# Patient Record
Sex: Male | Born: 1965
Health system: Southern US, Community
[De-identification: ages and names within clinical notes are randomized; demographics above are authoritative.]

## PROBLEM LIST (undated history)

## (undated) ENCOUNTER — Emergency Department (HOSPITAL_COMMUNITY): Discharge status: Absent without leave | Payer: BLUE CROSS/BLUE SHIELD

## (undated) DIAGNOSIS — K219 Gastro-esophageal reflux disease without esophagitis: Secondary | ICD-10-CM

## (undated) DIAGNOSIS — I10 Essential (primary) hypertension: Secondary | ICD-10-CM

## (undated) DIAGNOSIS — G4730 Sleep apnea, unspecified: Secondary | ICD-10-CM

## (undated) DIAGNOSIS — E119 Type 2 diabetes mellitus without complications: Secondary | ICD-10-CM

## (undated) DIAGNOSIS — F419 Anxiety disorder, unspecified: Secondary | ICD-10-CM

## (undated) DIAGNOSIS — M549 Dorsalgia, unspecified: Secondary | ICD-10-CM

## (undated) DIAGNOSIS — M1990 Unspecified osteoarthritis, unspecified site: Secondary | ICD-10-CM

## (undated) DIAGNOSIS — R748 Abnormal levels of other serum enzymes: Secondary | ICD-10-CM

## (undated) DIAGNOSIS — E782 Mixed hyperlipidemia: Secondary | ICD-10-CM

## (undated) DIAGNOSIS — D649 Anemia, unspecified: Secondary | ICD-10-CM

## (undated) HISTORY — PX: CATARACT EXTRACTION: SUR2

## (undated) HISTORY — PX: FRACTURE SURGERY: SHX138

---

## 2000-09-17 ENCOUNTER — Emergency Department (HOSPITAL_COMMUNITY): Admission: EM | Admit: 2000-09-17 | Discharge: 2000-09-17 | Payer: Self-pay | Admitting: Emergency Medicine

## 2000-10-10 ENCOUNTER — Emergency Department (HOSPITAL_COMMUNITY): Admission: EM | Admit: 2000-10-10 | Discharge: 2000-10-10 | Payer: Self-pay | Admitting: Emergency Medicine

## 2002-05-14 ENCOUNTER — Emergency Department (HOSPITAL_COMMUNITY): Admission: EM | Admit: 2002-05-14 | Discharge: 2002-05-14 | Payer: Self-pay | Admitting: Emergency Medicine

## 2003-05-09 ENCOUNTER — Emergency Department (HOSPITAL_COMMUNITY): Admission: EM | Admit: 2003-05-09 | Discharge: 2003-05-09 | Payer: Self-pay | Admitting: Emergency Medicine

## 2003-05-09 ENCOUNTER — Encounter: Payer: Self-pay | Admitting: Emergency Medicine

## 2005-03-16 ENCOUNTER — Emergency Department (HOSPITAL_COMMUNITY): Admission: EM | Admit: 2005-03-16 | Discharge: 2005-03-16 | Payer: Self-pay | Admitting: Emergency Medicine

## 2005-03-22 ENCOUNTER — Ambulatory Visit: Payer: Self-pay | Admitting: Pulmonary Disease

## 2005-04-21 ENCOUNTER — Ambulatory Visit (HOSPITAL_BASED_OUTPATIENT_CLINIC_OR_DEPARTMENT_OTHER): Admission: RE | Admit: 2005-04-21 | Discharge: 2005-04-21 | Payer: Self-pay | Admitting: Pulmonary Disease

## 2005-04-21 ENCOUNTER — Ambulatory Visit: Payer: Self-pay | Admitting: Pulmonary Disease

## 2005-05-02 ENCOUNTER — Ambulatory Visit: Payer: Self-pay | Admitting: Pulmonary Disease

## 2005-05-24 ENCOUNTER — Ambulatory Visit: Payer: Self-pay | Admitting: Cardiology

## 2005-05-31 ENCOUNTER — Ambulatory Visit: Payer: Self-pay

## 2005-06-21 ENCOUNTER — Ambulatory Visit: Payer: Self-pay | Admitting: Pulmonary Disease

## 2005-07-07 ENCOUNTER — Inpatient Hospital Stay (HOSPITAL_COMMUNITY): Admission: RE | Admit: 2005-07-07 | Discharge: 2005-07-08 | Payer: Self-pay | Admitting: General Surgery

## 2006-08-06 HISTORY — PX: HERNIA REPAIR: SHX51

## 2006-09-06 ENCOUNTER — Emergency Department (HOSPITAL_COMMUNITY): Admission: EM | Admit: 2006-09-06 | Discharge: 2006-09-06 | Payer: Self-pay | Admitting: Emergency Medicine

## 2010-08-26 ENCOUNTER — Encounter: Payer: Self-pay | Admitting: Unknown Physician Specialty

## 2012-08-06 DIAGNOSIS — R748 Abnormal levels of other serum enzymes: Secondary | ICD-10-CM

## 2012-08-06 HISTORY — DX: Abnormal levels of other serum enzymes: R74.8

## 2012-09-23 ENCOUNTER — Other Ambulatory Visit: Payer: Self-pay | Admitting: Gastroenterology

## 2012-09-29 ENCOUNTER — Encounter (HOSPITAL_COMMUNITY)
Admission: RE | Disposition: A | Discharge status: Home / Self Care | Payer: Self-pay | Source: Ambulatory Visit | Attending: Gastroenterology

## 2012-09-29 ENCOUNTER — Ambulatory Visit (HOSPITAL_COMMUNITY)
Admission: RE | Admit: 2012-09-29 | Discharge: 2012-09-29 | Disposition: A | Discharge status: Home / Self Care | Payer: BC Managed Care – PPO | Source: Ambulatory Visit | Attending: Gastroenterology | Admitting: Gastroenterology

## 2012-09-29 ENCOUNTER — Encounter: Payer: Self-pay | Admitting: Pulmonary Disease

## 2012-09-29 ENCOUNTER — Encounter (HOSPITAL_COMMUNITY): Payer: Self-pay

## 2012-09-29 DIAGNOSIS — K648 Other hemorrhoids: Secondary | ICD-10-CM | POA: Insufficient documentation

## 2012-09-29 DIAGNOSIS — G4730 Sleep apnea, unspecified: Secondary | ICD-10-CM | POA: Insufficient documentation

## 2012-09-29 DIAGNOSIS — Z79899 Other long term (current) drug therapy: Secondary | ICD-10-CM | POA: Insufficient documentation

## 2012-09-29 DIAGNOSIS — E119 Type 2 diabetes mellitus without complications: Secondary | ICD-10-CM | POA: Insufficient documentation

## 2012-09-29 DIAGNOSIS — K625 Hemorrhage of anus and rectum: Secondary | ICD-10-CM | POA: Insufficient documentation

## 2012-09-29 DIAGNOSIS — D371 Neoplasm of uncertain behavior of stomach: Secondary | ICD-10-CM | POA: Insufficient documentation

## 2012-09-29 DIAGNOSIS — K219 Gastro-esophageal reflux disease without esophagitis: Secondary | ICD-10-CM | POA: Insufficient documentation

## 2012-09-29 DIAGNOSIS — D378 Neoplasm of uncertain behavior of other specified digestive organs: Secondary | ICD-10-CM | POA: Insufficient documentation

## 2012-09-29 DIAGNOSIS — I10 Essential (primary) hypertension: Secondary | ICD-10-CM | POA: Insufficient documentation

## 2012-09-29 DIAGNOSIS — R079 Chest pain, unspecified: Secondary | ICD-10-CM | POA: Insufficient documentation

## 2012-09-29 HISTORY — DX: Essential (primary) hypertension: I10

## 2012-09-29 HISTORY — PX: ESOPHAGOGASTRODUODENOSCOPY: SHX5428

## 2012-09-29 HISTORY — PX: COLONOSCOPY: SHX5424

## 2012-09-29 HISTORY — DX: Type 2 diabetes mellitus without complications: E11.9

## 2012-09-29 HISTORY — DX: Sleep apnea, unspecified: G47.30

## 2012-09-29 SURGERY — EGD (ESOPHAGOGASTRODUODENOSCOPY)
Anesthesia: Moderate Sedation

## 2012-09-29 MED ORDER — MIDAZOLAM HCL 10 MG/2ML IJ SOLN
INTRAMUSCULAR | Status: AC
  Filled 2012-09-29: qty 4

## 2012-09-29 MED ORDER — SODIUM CHLORIDE 0.9 % IV SOLN
INTRAVENOUS | Status: DC
  Administered 2012-09-29: 15:00:00 via INTRAVENOUS

## 2012-09-29 MED ORDER — BUTAMBEN-TETRACAINE-BENZOCAINE 2-2-14 % EX AERO
INHALATION_SPRAY | CUTANEOUS | Status: DC | PRN
  Administered 2012-09-29: 2 via TOPICAL

## 2012-09-29 MED ORDER — FENTANYL CITRATE 0.05 MG/ML IJ SOLN
INTRAMUSCULAR | Status: AC
  Filled 2012-09-29: qty 4

## 2012-09-29 MED ORDER — FENTANYL CITRATE 0.05 MG/ML IJ SOLN
INTRAMUSCULAR | Status: DC | PRN
  Administered 2012-09-29 (×4): 25 ug via INTRAVENOUS

## 2012-09-29 MED ORDER — DIPHENHYDRAMINE HCL 50 MG/ML IJ SOLN
INTRAMUSCULAR | Status: AC
  Filled 2012-09-29: qty 1

## 2012-09-29 MED ORDER — DIPHENHYDRAMINE HCL 50 MG/ML IJ SOLN
INTRAMUSCULAR | Status: DC | PRN
  Administered 2012-09-29: 25 mg via INTRAVENOUS

## 2012-09-29 MED ORDER — MIDAZOLAM HCL 10 MG/2ML IJ SOLN
INTRAMUSCULAR | Status: DC | PRN
  Administered 2012-09-29 (×4): 2 mg via INTRAVENOUS

## 2012-09-29 NOTE — Op Note (Signed)
[facility] [address] [place], [address]   OPERATIVE PROCEDURE REPORT  PATIENT: Scott [name], Scott [name]  MR#: [id number] BIRTHDATE: Oct 31, 1965 GENDER: Male ENDOSCOPIST: Dr.  Lorenza Burton, MD ASSISTANT:   Anthony Sar, RN Oletha Blend, technician Jamal Maes, RN PROCEDURE DATE: 09/29/2012 PRE-PROCEDURE PREPARATION: Moviprep was taken as instructed (32 ounces the night prior to the procedure and 32 ounces the morning of the procedure).  The patient was fasted for four hours prior to the procedure. PRE-PROCEDURE PHYSICAL: Patient has stable vital signs.  Neck is supple.  There is no JVD, thyromegaly or LAD.  Chest clear to auscultation.  S1 and S2 regular.  Abdomen soft, non-distended, non-tender with NABS. PROCEDURE:     Colonoscopy with snare polypectomy ASA CLASS:     Class IV INDICATIONS:     1.  Rectal Bleeding.   2.  Colorectal cancer screening. MEDICATIONS:     Fentanyl 25 mcg IV and Versed 4 mg IV  DESCRIPTION OF PROCEDURE: After the risks, benefits, and alternatives of the procedure were thoroughly explained [including a 10% missed rate of cancer and polyps], informed consent was obtained.  Digital rectal exam was performed.  The [phone])  was introduced through the anus  and advanced to the terminal ileum which was intubated for a short distance , limited by No adverse events experienced.   The quality of the prep was excellent, using MoviPrep . Multiple washes were done. Small lesions could be missed. The instrument was then slowly withdrawn as the colon was fully examined.     COLON FINDINGS: A 6-7 mm sessile polyp was found in the rectosigmoid colon and was removed by a hot snare x 1 [150/15]. The rest of the entire colonic mucosa appeared healthy with a normal vascular pattern.  No masses, diverticula or AVMs were noted.  The appendiceal orifice and the ICV were identified and photographed. The terminal ileum appeared  normal.  Retroflexed views revealed small internal hemorrhoids. The patient tolerated the procedure without immediate complications.  The scope was then withdrawn from the patient and the procedure terminated.  TIME TO CECUM:  8 minutes 0 seconds WITHDRAW TIME:  6 minutes 0 seconds  IMPRESSION:     1) Sessile polyp was found in the rectosigmoid colon-removed by hot snare x 1 2) Small internal hemorrhoids.   RECOMMENDATIONS:     1.  Hold all aspirin, aspirin products, and anti-inflammatory medication for 2 weeks. 2.  Await pathology results. 3.  Continue surveillance.  REPEAT EXAM:      In 5 year(s)  for a repeat colonoscopy.  If the patient has any abnormal GI symptoms in the interim, she/he have been advised to contact the office as soon as possible for further recommendations.   CPT CODES:     [phone], Colonoscopy with Polypectomy (Snare)  DIAGNOSIS CODES:     569.3 Hematochezia V76.51 Colorectal cancer screening  REFERRED HK:[id number] Tiburcio Pea, M.D.  eSigned:  Dr. Lorenza Burton, MD 09/29/2012 4:00 PM   PATIENT NAME:  Scott [name], Scott [name] MR#: [id number]

## 2012-09-29 NOTE — Op Note (Signed)
[facility] [address] [place], [address]   OPERATIVE PROCEDURE REPORT  PATIENT :Scott [name], Scott [name]  MR#: [id number] BIRTHDATE :1966-01-09 GENDER: Male ENDOSCOPIST: Dr.  Lorenza Burton, MD ASSISTANT:   Anthony Sar, RN Oletha Blend, technician Jamal Maes, RN PROCEDURE DATE: October 26, 2012 PRE-PROCEDURE PREPERATION: Patient fasted for 4 hours prior to procedure. PRE-PROCEDURE PHYSICAL: Patient has stable vital signs.  Neck is supple.  There is no JVD, thyromegaly or LAD.  Chest clear to auscultation.  S1 and S2 regular.  Abdomen soft, morbidly obese, non-distended, non-tender with NABS. PROCEDURE:     EGD, diagnostic ASA CLASS:     Class IV INDICATIONS:     Chest pain, Acid reflux , and Hematochezia. MEDICATIONS:     Fentanyl 75 mcg IV, Versed 6 mg IV, and Benadryl 25 mg IV TOPICAL ANESTHETIC:   Cetacaine spray. DESCRIPTION OF PROCEDURE:   After the risks benefits and alternatives of the procedure were thoroughly explained, informed consent was obtained.  The Pentax Gastroscope [id number]  was introduced through the mouth and advanced to the second portion of the duodenum , without limitations. The instrument was slowly withdrawn as the mucosa was fully examined.   The esophagus, stomach and the proximal small bowel appeared normal. There were no ulcers, erosions, masses or polyps noted. Retroflexed views revealed no abnormalities. The scope was then withdrawn from the patient and the procedure terminated. The patient tolerated the procedure without immediate complications.  IMPRESSION:  Normal esophagogastroduodenoscopy.  RECOMMENDATIONS:     Avoid NSAIDS for now and continue PPI's.  REPEAT EXAM:  None planned for now.  DISCHARGE INSTRUCTIONS: standard discharge _______________________________ eSigned:  Dr. Lorenza Burton, MD 26-Oct-2012 3:33 PM   CPT CODES:     [phone], EGD  DIAGNOSIS CODES:     786.50 Chest Pain 530.81 GERD  569.3 Hematochezia   CC: Holley Bouche, M.D.

## 2012-09-29 NOTE — Progress Notes (Signed)
Pt's AVS not completed at this time. Paged physician to inform.  Physician called and informed that she did do her AVS and will not be returning to the hospital at this time.  Since d/c papers could not be printed d/t AVS not complete, generic d/c colonoscopy and endoscopy were printed and reviewed w/ pt and family.

## 2012-09-29 NOTE — H&P (Signed)
Scott [name] is an 47 y.o. [place] male.   Chief Complaint: Rectal bleeding and acid reflux. HPI: Patient is here for further evaluation of rectal bleeding that has been an issue off and on for about 1 year. He takes Omeprazole for reflux and has also been taking Meloxicam for joint pains. Patient has severe sleep apnea and therefore he was brought to the hospital for his procedures. He denies a history of ulcers, jaundice or colitis. He has a good appetite and has gained significant amount of weight in the last 5 years.    Past Medical History  Diagnosis Date  . Hypertension   . Sleep apnea   . Diabetes mellitus without complication     Past Surgical History  Procedure Laterality Date  . Fracture surgery      right wrist with screws   History reviewed. No pertinent family history. Social History:  does not have a smoking history on file. He has never used smokeless tobacco. He reports that  drinks alcohol. He reports that he does not use illicit drugs.  Allergies: No Known Allergies  Medications Prior to Admission  Medication Sig Dispense Refill  . atenolol-chlorthalidone (TENORETIC) 100-25 MG per tablet Take 1 tablet by mouth daily.      . busPIRone (BUSPAR) 5 MG tablet Take 5 mg by mouth 2 times daily at 12 noon and 4 pm.      . fish oil-omega-3 fatty acids 1000 MG capsule Take 2 g by mouth daily.      Marland Kitchen HYDROcodone-acetaminophen (VICODIN) 5-500 MG per tablet Take 1 tablet by mouth every 6 (six) hours as needed for pain.      Marland Kitchen lisinopril (PRINIVIL,ZESTRIL) 2.5 MG tablet Take 2.5 mg by mouth daily.      Marland Kitchen loratadine (CLARITIN) 10 MG tablet Take 10 mg by mouth daily.      . metFORMIN (GLUCOPHAGE) 500 MG tablet Take 1,000 mg by mouth daily with breakfast.      . omeprazole (PRILOSEC) 20 MG capsule Take 20 mg by mouth daily.      . pravastatin (PRAVACHOL) 40 MG tablet Take 40 mg by mouth daily.        Results for orders placed during the hospital encounter of 09/29/12  (from the past 48 hour(s))  GLUCOSE, CAPILLARY     Status: Abnormal   Collection Time    09/29/12  2:48 PM      Result Value Range   Glucose-Capillary 114 (*) 70 - 99 mg/dL   No results found.  Review of Systems  Constitutional: Negative for fever, chills, weight loss, malaise/fatigue and diaphoresis.  HENT: Positive for neck pain.   Eyes: Negative.   Respiratory: Negative.  Negative for wheezing.   Gastrointestinal: Positive for heartburn, nausea, abdominal pain and blood in stool. Negative for vomiting, diarrhea, constipation and melena.  Genitourinary: Negative.   Musculoskeletal: Positive for myalgias, back pain and joint pain.  Skin: Negative.   Neurological: Negative.   Psychiatric/Behavioral: Negative.    Blood pressure 134/74, temperature 98.9 F (37.2 C), temperature source Oral, resp. rate 24, height 6\' 2"  (1.88 m), weight 149.233 kg (329 lb), SpO2 95.00%. Physical Exam  Constitutional: He is oriented to person, place, and time. He appears well-developed and well-nourished.  HENT:  Head: Normocephalic and atraumatic.  Eyes: Conjunctivae and EOM are normal. Pupils are equal, round, and reactive to light.  Neck: Normal range of motion. Neck supple.  Cardiovascular: Normal rate and regular rhythm.   Respiratory: Effort normal and  breath sounds normal.  GI: Soft. Bowel sounds are normal. He exhibits no distension and no mass. There is no tenderness. There is no rebound and no guarding.  Musculoskeletal: Normal range of motion.  Neurological: He is alert and oriented to person, place, and time.  Skin: Skin is warm and dry.  Psychiatric: He has a normal mood and affect. His behavior is normal. Judgment and thought content normal.     Assessment/Plan Acid reflux and rectal bleeding: proceed with an EGD and a colonoscopy at this time.  Tyge Somers 09/29/2012, 3:09 PM

## 2012-09-30 ENCOUNTER — Encounter (HOSPITAL_COMMUNITY): Payer: Self-pay | Admitting: Gastroenterology

## 2012-12-31 ENCOUNTER — Other Ambulatory Visit: Payer: Self-pay | Admitting: Family Medicine

## 2012-12-31 DIAGNOSIS — R7989 Other specified abnormal findings of blood chemistry: Secondary | ICD-10-CM

## 2013-01-01 ENCOUNTER — Ambulatory Visit
Admission: RE | Admit: 2013-01-01 | Discharge: 2013-01-01 | Disposition: A | Discharge status: Home / Self Care | Payer: BC Managed Care – PPO | Source: Ambulatory Visit | Attending: Family Medicine | Admitting: Family Medicine

## 2013-01-01 DIAGNOSIS — R7989 Other specified abnormal findings of blood chemistry: Secondary | ICD-10-CM

## 2014-10-13 ENCOUNTER — Other Ambulatory Visit (INDEPENDENT_AMBULATORY_CARE_PROVIDER_SITE_OTHER): Payer: Self-pay | Admitting: General Surgery

## 2014-11-17 ENCOUNTER — Telehealth: Payer: Self-pay | Admitting: Dietician

## 2014-11-17 ENCOUNTER — Encounter: Payer: Self-pay | Admitting: Dietician

## 2014-11-17 ENCOUNTER — Encounter: Payer: 59 | Attending: General Surgery | Admitting: Dietician

## 2014-11-17 DIAGNOSIS — Z713 Dietary counseling and surveillance: Secondary | ICD-10-CM | POA: Diagnosis not present

## 2014-11-17 DIAGNOSIS — Z6841 Body Mass Index (BMI) 40.0 and over, adult: Secondary | ICD-10-CM | POA: Diagnosis not present

## 2014-11-17 NOTE — Patient Instructions (Signed)

## 2014-11-17 NOTE — Telephone Encounter (Signed)
Error

## 2014-11-17 NOTE — Progress Notes (Signed)
  Pre-Op Assessment Visit:  Pre-Operative Sleeve Gastrectomy Surgery  Medical Nutrition Therapy:  Appt start time: 9292   End time:  4462.  Patient was seen on 11/17/2014 for Pre-Operative Nutrition Assessment. Assessment and letter of approval faxed to [facility]ariat[facility] Program coordinator on 11/17/2014.   Preferred Learning Style:   No preference indicated   Learning Readiness:   Ready  Handouts given during visit include:  Pre-Op Goals Bariatric Surgery Protein Shakes   During the appointment today the following Pre-Op Goals were reviewed with the patient: Maintain or lose weight as instructed by your surgeon Make healthy food choices Begin to limit portion sizes Limited concentrated sugars and fried foods Keep fat/sugar in the single digits per serving on   food labels Practice CHEWING your food  (aim for 30 chews per bite or until applesauce consistency) Practice not drinking 15 minutes before, during, and 30 minutes after each meal/snack Avoid all carbonated beverages  Avoid/limit caffeinated beverages  Avoid all sugar-sweetened beverages Consume 3 meals per day; eat every 3-5 hours Make a list of non-food related activities Aim for 64-100 ounces of FLUID daily  Aim for at least 60-80 grams of PROTEIN daily Look for a liquid protein source that contain ?15 g protein and ?5 g carbohydrate  (ex: shakes, drinks, shots)  Patient-Centered Goals: Scott [name] would like to not have to use CPAP, improve his diabetes/blood pressure, and become much more active. 10 level of confidence/10 level of importance   Demonstrated degree of understanding via:  Teach Back  Teaching Method Utilized:  Visual Auditory Hands on  Barriers to learning/adherence to lifestyle change: none  Patient to [facility] the [facility] to enroll in Pre-Op and Post-Op Nutrition Education when surgery date is scheduled.

## 2014-11-29 ENCOUNTER — Ambulatory Visit (HOSPITAL_COMMUNITY): Admission: RE | Admit: 2014-11-29 | Payer: 59 | Source: Ambulatory Visit | Admitting: General Surgery

## 2014-11-29 ENCOUNTER — Inpatient Hospital Stay (HOSPITAL_COMMUNITY): Admission: RE | Admit: 2014-11-29 | Payer: Self-pay | Source: Ambulatory Visit

## 2014-11-29 ENCOUNTER — Ambulatory Visit (HOSPITAL_COMMUNITY): Admission: RE | Admit: 2014-11-29 | Payer: Self-pay | Source: Ambulatory Visit

## 2014-11-29 SURGERY — BREATH TEST, FOR HELICOBACTER PYLORI

## 2014-12-11 ENCOUNTER — Ambulatory Visit: Payer: Self-pay | Admitting: Dietician

## 2014-12-20 ENCOUNTER — Ambulatory Visit: Payer: 59 | Admitting: Dietician

## 2014-12-22 ENCOUNTER — Encounter: Payer: 59 | Attending: General Surgery | Admitting: Dietician

## 2014-12-22 ENCOUNTER — Encounter: Payer: Self-pay | Admitting: Dietician

## 2014-12-22 DIAGNOSIS — Z713 Dietary counseling and surveillance: Secondary | ICD-10-CM | POA: Insufficient documentation

## 2014-12-22 DIAGNOSIS — Z6841 Body Mass Index (BMI) 40.0 and over, adult: Secondary | ICD-10-CM | POA: Diagnosis not present

## 2014-12-22 NOTE — Progress Notes (Signed)
  6 Months Supervised Weight Loss Visit:   Pre-Operative Sleeve Gastrectomy Surgery  Medical Nutrition Therapy:  Appt start time: 5462 end time:  1225.  Primary concerns today: Supervised Weight Loss Visit. Returns with a 6 lb weight loss since last month. No longer having pizza and hamburgers. Eating 2 meals per day. Has tried protein shakes but doesn't like them. Wants to try more. Working on chewing well and not drinking during meals though not waiting 30 minutes to drink.   Weight: 312.9 lbs BMI: 41.9   Preferred Learning Style:   No preference indicated   Learning Readiness:   Ready   Medications: see list  Recent physical activity:  Walking a mile each day, mows lawn  Progress Towards Goal(s):  In progress.    Nutritional Diagnosis:  [place]-3.3 Obesity related to past poor dietary habits and physical inactivity as evidenced by patient attending supervised weight loss for insurance approval of bariatric surgery.    Intervention:  Nutrition counseling provided. Plan: Continue having healthy meals. Plan to exercise 5 x week for 25 minutes. Start working on waiting 30 minutes to drink after meals. Work on having 3 meals per day. Have an a couple eggs for breakfast. Try some other protein shakes.   Teaching Method Utilized:  Visual Auditory Hands on  Barriers to learning/adherence to lifestyle change: none  Demonstrated degree of understanding via:  Teach Back   Monitoring/Evaluation:  Dietary intake, exercise, and body weight. Follow up in 1 months for 6 month supervised weight loss visit.

## 2014-12-22 NOTE — Patient Instructions (Signed)
Continue having healthy meals. Plan to exercise 5 x week for 25 minutes. Start working on waiting 30 minutes to drink after meals. Work on having 3 meals per day. Have an a couple eggs for breakfast. Try some other protein shakes.

## 2014-12-30 ENCOUNTER — Encounter (INDEPENDENT_AMBULATORY_CARE_PROVIDER_SITE_OTHER): Payer: Self-pay

## 2014-12-30 ENCOUNTER — Ambulatory Visit (HOSPITAL_COMMUNITY): Admission: RE | Admit: 2014-12-30 | Payer: 59 | Source: Ambulatory Visit | Admitting: General Surgery

## 2014-12-30 ENCOUNTER — Encounter (HOSPITAL_COMMUNITY): Admission: RE | Payer: Self-pay | Source: Ambulatory Visit

## 2014-12-30 ENCOUNTER — Ambulatory Visit (HOSPITAL_COMMUNITY)
Admission: RE | Admit: 2014-12-30 | Discharge: 2014-12-30 | Disposition: A | Discharge status: Home / Self Care | Payer: 59 | Source: Ambulatory Visit | Attending: General Surgery | Admitting: General Surgery

## 2014-12-30 ENCOUNTER — Other Ambulatory Visit: Payer: Self-pay

## 2014-12-30 DIAGNOSIS — G4730 Sleep apnea, unspecified: Secondary | ICD-10-CM | POA: Insufficient documentation

## 2014-12-30 DIAGNOSIS — I10 Essential (primary) hypertension: Secondary | ICD-10-CM | POA: Insufficient documentation

## 2014-12-30 DIAGNOSIS — E119 Type 2 diabetes mellitus without complications: Secondary | ICD-10-CM | POA: Insufficient documentation

## 2014-12-30 DIAGNOSIS — Z6841 Body Mass Index (BMI) 40.0 and over, adult: Secondary | ICD-10-CM | POA: Insufficient documentation

## 2014-12-30 SURGERY — BREATH TEST, FOR HELICOBACTER PYLORI

## 2015-01-17 ENCOUNTER — Ambulatory Visit: Payer: 59 | Admitting: Dietician

## 2015-02-14 ENCOUNTER — Encounter: Payer: 59 | Attending: General Surgery | Admitting: Dietician

## 2015-02-14 ENCOUNTER — Encounter: Payer: Self-pay | Admitting: Dietician

## 2015-02-14 DIAGNOSIS — Z6841 Body Mass Index (BMI) 40.0 and over, adult: Secondary | ICD-10-CM | POA: Insufficient documentation

## 2015-02-14 DIAGNOSIS — Z713 Dietary counseling and surveillance: Secondary | ICD-10-CM | POA: Insufficient documentation

## 2015-02-14 NOTE — Progress Notes (Signed)
  6 Months Supervised Weight Loss Visit:   Pre-Operative Sleeve Gastrectomy Surgery  Medical Nutrition Therapy:  Appt start time: 410 end time:  425.  Primary concerns today: Supervised Weight Loss Visit. Returns with a 4 lb weight loss in the last 2 months. He reports that his insurance "dropped" CCS and he is not able to have surgery anymore. 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 He and his family have cut out fast foods. He drinks only water, unsweet tea, and decaf coffee. He bikes 10-20 minutes per day and has an active job.   Weight: 309 lbs BMI: 40.9   Preferred Learning Style:   No preference indicated   Learning Readiness:   Ready   Medications: see list  Recent physical activity:  Biking 10-20 minutes each day  Progress Towards Goal(s):  In progress.    Nutritional Diagnosis:  [place]-3.3 Obesity related to past poor dietary habits and physical inactivity as evidenced by patient attending supervised weight loss for insurance approval of bariatric surgery.    Intervention:  Nutrition counseling provided. Plan: Continue having healthy meals. Plan to exercise 5 x week for 25 minutes. Start working on waiting 30 minutes to drink after meals. Work on having 3 meals per day. Have an a couple eggs for breakfast. Try some other protein shakes.   Teaching Method Utilized:  Visual Auditory Hands on  Barriers to learning/adherence to lifestyle change: none  Demonstrated degree of understanding via:  Teach Back   Monitoring/Evaluation:  Dietary intake, exercise, and body weight. Follow up in 1 months for 6 month supervised weight loss visit.

## 2015-02-14 NOTE — Patient Instructions (Signed)
Continue having healthy meals. Plan to exercise 5 x week for 25 minutes. Start working on waiting 30 minutes to drink after meals. Try some other protein shakes.

## 2015-03-15 ENCOUNTER — Encounter: Payer: 59 | Attending: General Surgery | Admitting: Dietician

## 2015-03-15 ENCOUNTER — Encounter: Payer: Self-pay | Admitting: Dietician

## 2015-03-15 DIAGNOSIS — Z713 Dietary counseling and surveillance: Secondary | ICD-10-CM | POA: Insufficient documentation

## 2015-03-15 DIAGNOSIS — Z6841 Body Mass Index (BMI) 40.0 and over, adult: Secondary | ICD-10-CM | POA: Insufficient documentation

## 2015-03-15 NOTE — Progress Notes (Signed)
  6 Months Supervised Weight Loss Visit:   Pre-Operative Sleeve Gastrectomy Surgery  Medical Nutrition Therapy:  Appt start time: 440 end time:  455  Primary concerns today: Supervised Weight Loss Visit. Returns with a 2 lb weight loss in the last month. Still drinking only water and biking regularly, about 3x a week. He feels like he is eating healthy but sometimes eats too much. He has been trying to drink a lot of water before his meals. He knows it is not recommended to drink while eating after surgery. His blood sugars have improved since losing weight, HgbA1c has dropped into the 6's. He has tried Producer, television/film/video protein shake and likes the Administrator, [employer].     Weight: 307.4 lbs BMI: 40.6   Preferred Learning Style:   No preference indicated   Learning Readiness:   Ready  B: skips, wakes up late S: protein shake L: chicken, rice, vegetables S: none D: eggs OR beans and rice OR ham sandwich on wheat  Medications: see list  Recent physical activity:  Biking 10-20 minutes each day  Progress Towards Goal(s):  In progress.    Nutritional Diagnosis:  [place]-3.3 Obesity related to past poor dietary habits and physical inactivity as evidenced by patient attending supervised weight loss for insurance approval of bariatric surgery.    Intervention:  Nutrition counseling provided. Plan: Continue having healthy meals. Plan to exercise 5 x week for 25 minutes. Start working on waiting 30 minutes to drink after meals. Work on having 3 meals per day. Have an a couple eggs for breakfast. Try some other protein shakes.   Teaching Method Utilized:  Visual Auditory Hands on  Barriers to learning/adherence to lifestyle change: none  Demonstrated degree of understanding via:  Teach Back   Monitoring/Evaluation:  Dietary intake, exercise, and body weight. Follow up in 1 months for 6 month supervised weight loss visit.

## 2015-04-14 ENCOUNTER — Ambulatory Visit: Payer: 59 | Admitting: Dietician

## 2015-04-18 ENCOUNTER — Ambulatory Visit: Payer: 59 | Admitting: Dietician

## 2015-08-01 ENCOUNTER — Encounter (HOSPITAL_COMMUNITY): Payer: Self-pay | Admitting: *Deleted

## 2015-08-01 ENCOUNTER — Emergency Department (HOSPITAL_COMMUNITY)
Admission: EM | Admit: 2015-08-01 | Discharge: 2015-08-01 | Disposition: A | Discharge status: Home / Self Care | Payer: 59 | Attending: Emergency Medicine | Admitting: Emergency Medicine

## 2015-08-01 DIAGNOSIS — B349 Viral infection, unspecified: Secondary | ICD-10-CM | POA: Insufficient documentation

## 2015-08-01 DIAGNOSIS — Z8669 Personal history of other diseases of the nervous system and sense organs: Secondary | ICD-10-CM | POA: Insufficient documentation

## 2015-08-01 DIAGNOSIS — E119 Type 2 diabetes mellitus without complications: Secondary | ICD-10-CM | POA: Insufficient documentation

## 2015-08-01 DIAGNOSIS — Z79899 Other long term (current) drug therapy: Secondary | ICD-10-CM | POA: Insufficient documentation

## 2015-08-01 DIAGNOSIS — Z7984 Long term (current) use of oral hypoglycemic drugs: Secondary | ICD-10-CM | POA: Diagnosis not present

## 2015-08-01 DIAGNOSIS — R05 Cough: Secondary | ICD-10-CM

## 2015-08-01 DIAGNOSIS — R059 Cough, unspecified: Secondary | ICD-10-CM

## 2015-08-01 DIAGNOSIS — R0981 Nasal congestion: Secondary | ICD-10-CM | POA: Diagnosis present

## 2015-08-01 DIAGNOSIS — I10 Essential (primary) hypertension: Secondary | ICD-10-CM | POA: Insufficient documentation

## 2015-08-01 DIAGNOSIS — R110 Nausea: Secondary | ICD-10-CM

## 2015-08-01 MED ORDER — ONDANSETRON 4 MG PO TBDP: 4.0000 mg | ORAL_TABLET | Freq: Three times a day (TID) | ORAL | Status: DC | PRN

## 2015-08-01 MED ORDER — DIPHENOXYLATE-ATROPINE 2.5-0.025 MG PO TABS: 1.0000 | ORAL_TABLET | Freq: Four times a day (QID) | ORAL | Status: DC | PRN

## 2015-08-01 MED ORDER — BENZONATATE 100 MG PO CAPS: 100.0000 mg | ORAL_CAPSULE | Freq: Three times a day (TID) | ORAL | Status: DC

## 2015-08-01 MED ORDER — HYDROCODONE-ACETAMINOPHEN 7.5-325 MG/15ML PO SOLN: 15.0000 mL | Freq: Four times a day (QID) | ORAL | Status: DC | PRN

## 2015-08-01 NOTE — Discharge Instructions (Signed)
Nausea, Adult Nausea means you feel sick to your stomach or need to throw up (vomit). It may be a sign of a more serious problem. If nausea gets worse, you may throw up. If you throw up a lot, you may lose too much body fluid (dehydration). HOME CARE   Get plenty of rest.  Ask your doctor how to replace body fluid losses (rehydrate).  Eat small amounts of food. Sip liquids more often.  Take all medicines as told by your doctor. GET HELP RIGHT AWAY IF:  You have a fever.  You pass out (faint).  You keep throwing up or have blood in your throw up.  You are very weak, have dry lips or a dry mouth, or you are very thirsty (dehydrated).  You have dark or bloody poop (stool).  You have very bad chest or belly (abdominal) pain.  You do not get better after 2 days, or you get worse.  You have a headache. MAKE SURE YOU:  Understand these instructions.  Will watch your condition.  Will get help right away if you are not doing well or get worse.   This information is not intended to replace advice given to you by your health care provider. Make sure you discuss any questions you have with your health care provider.   Document Released: 07/12/2011 Document Revised: 10/15/2011 Document Reviewed: 07/12/2011 Elsevier Interactive Patient Education 2016 Elsevier Inc.  Cough, Adult A cough helps to clear your throat and lungs. A cough may last only 2-3 weeks (acute), or it may last longer than 8 weeks (chronic). Many different things can cause a cough. A cough may be a sign of an illness or another medical condition. HOME CARE  Pay attention to any changes in your cough.  Take medicines only as told by your doctor.  If you were prescribed an antibiotic medicine, take it as told by your doctor. Do not stop taking it even if you start to feel better.  Talk with your doctor before you try using a cough medicine.  Drink enough fluid to keep your pee (urine) clear or pale yellow.  If  the air is dry, use a cold steam vaporizer or humidifier in your home.  Stay away from things that make you cough at work or at home.  If your cough is worse at night, try using extra pillows to raise your head up higher while you sleep.  Do not smoke, and try not to be around smoke. If you need help quitting, ask your doctor.  Do not have caffeine.  Do not drink alcohol.  Rest as needed. GET HELP IF:  You have new problems (symptoms).  You cough up yellow fluid (pus).  Your cough does not get better after 2-3 weeks, or your cough gets worse.  Medicine does not help your cough and you are not sleeping well.  You have pain that gets worse or pain that is not helped with medicine.  You have a fever.  You are losing weight and you do not know why.  You have night sweats. GET HELP RIGHT AWAY IF:  You cough up blood.  You have trouble breathing.  Your heartbeat is very fast.   This information is not intended to replace advice given to you by your health care provider. Make sure you discuss any questions you have with your health care provider.   Document Released: 04/05/2011 Document Revised: 04/13/2015 Document Reviewed: 09/29/2014 Elsevier Interactive Patient Education [employer].

## 2015-08-01 NOTE — ED Notes (Signed)
Patient with no complaints at this time. Respirations even and unlabored. Skin warm/dry. Discharge instructions reviewed with patient at this time. Patient given opportunity to voice concerns/ask questions. Patient discharged at this time and left Emergency Department with steady gait.   

## 2015-08-01 NOTE — ED Notes (Signed)
Pt comes in with nasal congestion and cough starting 5 days ago. Pt denies any vomiting but states he has had multiple episodes of diarrhea. Pt has fever upon triage.

## 2015-08-01 NOTE — ED Provider Notes (Signed)
History  By signing my name below, I, Marlowe Kays, attest that this documentation has been prepared under the direction and in the presence of Tanna Furry, MD. Electronically Signed: Marlowe Kays, ED Scribe. 08/01/2015. 12:20 PM.  Chief Complaint  Patient presents with  . Nasal Congestion  . Diarrhea   The history is provided by the patient and medical records. No language interpreter was used.    HPI Comments:  Scott [name] is a 49 y.o. morbidly obese male who presents to the Emergency Department complaining of cough that began approximately four days ago. He reports mild HA, fever, nasal congestion, decreased appetite and diarrhea. He reports small amount of blood in the diarrhea but attributes that to his hemorrhoids. He states his 58-year-old daughter has a cough and he and his family were on a cruise for several days, returning three days ago. He has not taken anything for his symptoms. He denies modifying factors. He states he did have a flu vaccination this year. He reports PMHx of HTN, DM, HLD. He states his CBGs have been good lately and his last A1C was 6.4.  Past Medical History  Diagnosis Date  . Hypertension   . Sleep apnea   . Diabetes mellitus without complication [facility])    Past Surgical History  Procedure Laterality Date  . Fracture surgery      right wrist with screws  . Esophagogastroduodenoscopy N/A 09/29/2012    Procedure: ESOPHAGOGASTRODUODENOSCOPY (EGD);  Surgeon: Juanita Craver, MD;  Location: [facility];  Service: Endoscopy;  Laterality: N/A;  . Colonoscopy N/A 09/29/2012    Procedure: COLONOSCOPY;  Surgeon: Juanita Craver, MD;  Location: [facility];  Service: Endoscopy;  Laterality: N/A;   No family history on file. Social History  Substance Use Topics  . Smoking status: Never Smoker   . Smokeless tobacco: Never Used  . Alcohol Use: Yes     Comment: rarely    Review of Systems  Constitutional: Negative for fever, chills, diaphoresis, appetite  change and fatigue.  HENT: Positive for congestion. Negative for mouth sores, sore throat and trouble swallowing.   Eyes: Negative for visual disturbance.  Respiratory: Positive for cough. Negative for chest tightness, shortness of breath and wheezing.   Cardiovascular: Negative for chest pain.  Gastrointestinal: Negative for nausea, vomiting, abdominal pain, diarrhea and abdominal distention.  Endocrine: Negative for polydipsia, polyphagia and polyuria.  Genitourinary: Negative for dysuria, frequency and hematuria.  Musculoskeletal: Negative for gait problem.  Skin: Negative for color change, pallor and rash.  Neurological: Negative for dizziness, syncope, light-headedness and headaches.  Hematological: Does not bruise/bleed easily.  Psychiatric/Behavioral: Negative for behavioral problems and confusion.    Allergies  Review of patient's allergies indicates no known allergies.  Home Medications   Prior to Admission medications   Medication Sig Start Date End Date Taking? Authorizing Provider  atenolol-chlorthalidone (TENORETIC) 100-25 MG per tablet Take 1 tablet by mouth daily.   Yes Historical Provider, MD  atorvastatin (LIPITOR) 20 MG tablet Take 20 mg by mouth daily.   Yes Historical Provider, MD  busPIRone (BUSPAR) 10 MG tablet Take 1 tablet by mouth 2 (two) times daily. 07/07/15  Yes Historical Provider, MD  canagliflozin (INVOKANA) 100 MG TABS tablet Take 100 mg by mouth daily before breakfast.   Yes Historical Provider, MD  cyclobenzaprine (FLEXERIL) 10 MG tablet Take 1 tablet by mouth daily as needed for muscle spasms.  07/07/15  Yes Historical Provider, MD  fish oil-omega-3 fatty acids 1000 MG capsule Take 2 g by mouth  daily.   Yes Historical Provider, MD  hydrOXYzine (ATARAX/VISTARIL) 25 MG tablet Take 1 tablet by mouth at bedtime as needed (sleep).  07/04/15  Yes Historical Provider, MD  lisinopril (PRINIVIL,ZESTRIL) 2.5 MG tablet Take 2.5 mg by mouth daily.   Yes Historical  Provider, MD  metFORMIN (GLUCOPHAGE) 500 MG tablet Take 1,000 mg by mouth daily with breakfast.   Yes Historical Provider, MD  omeprazole (PRILOSEC) 20 MG capsule Take 20 mg by mouth daily.   Yes Historical Provider, MD  benzonatate (TESSALON) 100 MG capsule Take 1 capsule (100 mg total) by mouth every 8 (eight) hours. 08/01/15   Tanna Furry, MD  diphenoxylate-atropine (LOMOTIL) 2.5-0.025 MG tablet Take 1 tablet by mouth 4 (four) times daily as needed for diarrhea or loose stools. 08/01/15   Tanna Furry, MD  HYDROcodone-acetaminophen (HYCET) 7.5-325 mg/15 ml solution Take 15 mLs by mouth 4 (four) times daily as needed for moderate pain. 08/01/15 07/31/16  Tanna Furry, MD  ondansetron (ZOFRAN ODT) 4 MG disintegrating tablet Take 1 tablet (4 mg total) by mouth every 8 (eight) hours as needed for nausea. 08/01/15   Tanna Furry, MD   Triage Vitals: BP 138/78 mmHg  Pulse 93  Temp(Src) 100.5 F (38.1 C) (Oral)  Resp 18  Ht 6\' 1"  (1.854 m)  Wt 310 lb (140.615 kg)  BMI 40.91 kg/m2  SpO2 100% Physical Exam  Constitutional: He is oriented to person, place, and time. He appears well-developed and well-nourished. No distress.  Morbid obesity. No distress.  HENT:  Head: Normocephalic.  Eyes: Pupils are equal, round, and reactive to light. Right conjunctiva is injected. Left conjunctiva is injected. No scleral icterus.  Neck: Normal range of motion. Neck supple. No thyromegaly present.  Cardiovascular: Normal rate and regular rhythm.  Exam reveals no gallop and no friction rub.   No murmur heard. Pulmonary/Chest: Effort normal and breath sounds normal. No respiratory distress. He has no wheezes. He has no rales.  Abdominal: Soft. Bowel sounds are normal. He exhibits no distension. There is no tenderness. There is no rebound.  Musculoskeletal: Normal range of motion.  Neurological: He is alert and oriented to person, place, and time.  Skin: Skin is warm and dry. No rash noted.  Psychiatric: He has a  normal mood and affect. His behavior is normal.    ED Course  Procedures (including critical care time) DIAGNOSTIC STUDIES: Oxygen Saturation is 100% on RA, normal by my interpretation.   COORDINATION OF CARE: 12:17 PM- Will prescribe cough medication, nausea medication and Lomotil. Pt verbalizes understanding and agrees to plan.  Medications - No data to display  Labs Review Labs Reviewed - No data to display  Imaging Review No results found. I have personally reviewed and evaluated these images and lab results as part of my medical decision-making.   EKG Interpretation None      MDM   Final diagnoses:  Viral syndrome  Cough  Nausea    Patient does not appear septic or toxic. Describes a viral type syndrome with dry cough occasional nausea and a few episodes of diarrhea. Sugars under control at home. Is not clinically dehydrated. Is not hypoxemic or tachycardic here. Think is appropriate for simple expectant management some control at home.  I personally performed the services described in this documentation, which was scribed in my presence. The recorded information has been reviewed and is accurate.     Tanna Furry, MD 08/01/15 [phone]

## 2016-02-21 DIAGNOSIS — R002 Palpitations: Secondary | ICD-10-CM | POA: Insufficient documentation

## 2016-02-22 ENCOUNTER — Encounter (INDEPENDENT_AMBULATORY_CARE_PROVIDER_SITE_OTHER): Payer: Self-pay

## 2016-02-22 ENCOUNTER — Telehealth: Payer: Self-pay | Admitting: Internal Medicine

## 2016-02-22 ENCOUNTER — Ambulatory Visit (INDEPENDENT_AMBULATORY_CARE_PROVIDER_SITE_OTHER): Payer: BLUE CROSS/BLUE SHIELD

## 2016-02-22 DIAGNOSIS — R002 Palpitations: Secondary | ICD-10-CM | POA: Diagnosis not present

## 2016-02-22 DIAGNOSIS — I493 Ventricular premature depolarization: Secondary | ICD-10-CM | POA: Diagnosis not present

## 2016-02-22 NOTE — Telephone Encounter (Signed)
This Probation officer was contacted by telehealth company regarding pt activation of remote event monitor. Tracing unavailable but showed sinus rhythm in the 80s. Pt activated and allegedly passed out. Contacted pt and he reports he hit the button in error and is in usual state of health.  Jay Schlichter, MD Cardiovascular Medicine

## 2016-03-28 ENCOUNTER — Other Ambulatory Visit: Payer: Self-pay | Admitting: Family Medicine

## 2016-06-14 ENCOUNTER — Encounter (HOSPITAL_COMMUNITY): Payer: Self-pay | Admitting: *Deleted

## 2016-06-22 ENCOUNTER — Encounter (HOSPITAL_COMMUNITY): Payer: Self-pay

## 2016-06-22 ENCOUNTER — Ambulatory Visit (HOSPITAL_COMMUNITY): Payer: BLUE CROSS/BLUE SHIELD | Admitting: Anesthesiology

## 2016-06-22 ENCOUNTER — Ambulatory Visit (HOSPITAL_COMMUNITY)
Admission: RE | Admit: 2016-06-22 | Discharge: 2016-06-22 | Disposition: A | Discharge status: Home / Self Care | Payer: BLUE CROSS/BLUE SHIELD | Source: Ambulatory Visit | Attending: Gastroenterology | Admitting: Gastroenterology

## 2016-06-22 ENCOUNTER — Encounter (HOSPITAL_COMMUNITY)
Admission: RE | Disposition: A | Discharge status: Home / Self Care | Payer: Self-pay | Source: Ambulatory Visit | Attending: Gastroenterology

## 2016-06-22 DIAGNOSIS — R197 Diarrhea, unspecified: Secondary | ICD-10-CM | POA: Diagnosis not present

## 2016-06-22 DIAGNOSIS — E119 Type 2 diabetes mellitus without complications: Secondary | ICD-10-CM | POA: Diagnosis not present

## 2016-06-22 DIAGNOSIS — G4730 Sleep apnea, unspecified: Secondary | ICD-10-CM | POA: Diagnosis not present

## 2016-06-22 DIAGNOSIS — Z6841 Body Mass Index (BMI) 40.0 and over, adult: Secondary | ICD-10-CM | POA: Insufficient documentation

## 2016-06-22 DIAGNOSIS — K921 Melena: Secondary | ICD-10-CM | POA: Insufficient documentation

## 2016-06-22 DIAGNOSIS — E781 Pure hyperglyceridemia: Secondary | ICD-10-CM | POA: Insufficient documentation

## 2016-06-22 DIAGNOSIS — F419 Anxiety disorder, unspecified: Secondary | ICD-10-CM | POA: Diagnosis not present

## 2016-06-22 DIAGNOSIS — I10 Essential (primary) hypertension: Secondary | ICD-10-CM | POA: Insufficient documentation

## 2016-06-22 DIAGNOSIS — K219 Gastro-esophageal reflux disease without esophagitis: Secondary | ICD-10-CM | POA: Insufficient documentation

## 2016-06-22 DIAGNOSIS — Z7984 Long term (current) use of oral hypoglycemic drugs: Secondary | ICD-10-CM | POA: Diagnosis not present

## 2016-06-22 DIAGNOSIS — Z9989 Dependence on other enabling machines and devices: Secondary | ICD-10-CM | POA: Insufficient documentation

## 2016-06-22 DIAGNOSIS — Z888 Allergy status to other drugs, medicaments and biological substances status: Secondary | ICD-10-CM | POA: Insufficient documentation

## 2016-06-22 DIAGNOSIS — E7800 Pure hypercholesterolemia, unspecified: Secondary | ICD-10-CM | POA: Insufficient documentation

## 2016-06-22 HISTORY — DX: Anxiety disorder, unspecified: F41.9

## 2016-06-22 HISTORY — PX: COLONOSCOPY WITH PROPOFOL: SHX5780

## 2016-06-22 HISTORY — DX: Dorsalgia, unspecified: M54.9

## 2016-06-22 HISTORY — DX: Abnormal levels of other serum enzymes: R74.8

## 2016-06-22 HISTORY — PX: ESOPHAGOGASTRODUODENOSCOPY (EGD) WITH PROPOFOL: SHX5813

## 2016-06-22 HISTORY — DX: Gastro-esophageal reflux disease without esophagitis: K21.9

## 2016-06-22 HISTORY — DX: Mixed hyperlipidemia: E78.2

## 2016-06-22 SURGERY — ESOPHAGOGASTRODUODENOSCOPY (EGD) WITH PROPOFOL
Anesthesia: Monitor Anesthesia Care

## 2016-06-22 MED ORDER — PROPOFOL 500 MG/50ML IV EMUL
INTRAVENOUS | Status: DC | PRN
  Administered 2016-06-22 (×3): 30 mg via INTRAVENOUS

## 2016-06-22 MED ORDER — LIDOCAINE HCL (CARDIAC) 20 MG/ML IV SOLN
INTRAVENOUS | Status: DC | PRN
  Administered 2016-06-22: 80 mg via INTRAVENOUS

## 2016-06-22 MED ORDER — LACTATED RINGERS IV SOLN
INTRAVENOUS | Status: DC
  Administered 2016-06-22: 14:00:00 via INTRAVENOUS

## 2016-06-22 MED ORDER — PROPOFOL 500 MG/50ML IV EMUL
INTRAVENOUS | Status: DC | PRN
  Administered 2016-06-22: 150 ug/kg/min via INTRAVENOUS

## 2016-06-22 SURGICAL SUPPLY — 25 items

## 2016-06-22 NOTE — Transfer of Care (Signed)
Immediate Anesthesia Transfer of Care Note  Patient: Scott [name]  Procedure(s) Performed: Procedure(s): ESOPHAGOGASTRODUODENOSCOPY (EGD) WITH PROPOFOL (N/A) COLONOSCOPY WITH PROPOFOL (N/A)  Patient Location: PACU  Anesthesia Type:MAC  Level of Consciousness: awake, alert  and oriented  Airway & Oxygen Therapy: Patient Spontanous Breathing and Patient connected to nasal cannula oxygen  Post-op Assessment: Report given to RN and Post -op Vital signs reviewed and stable  Post vital signs: Reviewed and stable  Last Vitals:  Vitals:   06/22/16 1349  BP: (!) 145/89  Pulse: 82  Resp: 20  Temp: 36.8 C    Last Pain:  Vitals:   06/22/16 1349  TempSrc: Oral         Complications: No apparent anesthesia complications

## 2016-06-22 NOTE — Anesthesia Postprocedure Evaluation (Signed)
Anesthesia Post Note  Patient: Shahmir Kulzer  Procedure(s) Performed: Procedure(s) (LRB): ESOPHAGOGASTRODUODENOSCOPY (EGD) WITH PROPOFOL (N/A) COLONOSCOPY WITH PROPOFOL (N/A)  Patient location during evaluation: PACU Anesthesia Type: MAC Level of consciousness: awake and alert Pain management: pain level controlled Vital Signs Assessment: post-procedure vital signs reviewed and stable Respiratory status: spontaneous breathing, nonlabored ventilation and respiratory function stable Cardiovascular status: stable and blood pressure returned to baseline Anesthetic complications: no    Last Vitals:  Vitals:   06/22/16 1427 06/22/16 1440  BP: (!) 139/107 137/87  Pulse: 92   Resp: 19   Temp: 36.4 C     Last Pain:  Vitals:   06/22/16 1427  TempSrc: Oral                 Dilynn Munroe,W. EDMOND

## 2016-06-22 NOTE — Op Note (Signed)
[facility] Patient Name: Scott [name] Procedure Date: 06/22/2016 MRN: [id number] Attending MD: Carol Ada , MD Date of Birth: 1965-08-17 CSN: [id number] Age: 50 Admit Type: Outpatient Procedure:                Upper GI endoscopy Indications:              Heartburn, Diarrhea Providers:                Carol Ada, MD, Dustin Flock RN, RN, Elspeth Cho Tech., Technician, Herbie Drape, CRNA Referring MD:              Medicines:                Propofol per Anesthesia Complications:            No immediate complications. Estimated Blood Loss:     Estimated blood loss was minimal. Procedure:                Pre-Anesthesia Assessment:                           - Prior to the procedure, a History and Physical                            was performed, and patient medications and                            allergies were reviewed. The patient's tolerance of                            previous anesthesia was also reviewed. The risks                            and benefits of the procedure and the sedation                            options and risks were discussed with the patient.                            All questions were answered, and informed consent                            was obtained. Prior Anticoagulants: The patient has                            taken no previous anticoagulant or antiplatelet                            agents. ASA Grade Assessment: III - A patient with                            severe systemic disease. After reviewing the risks  and benefits, the patient was deemed in                            satisfactory condition to undergo the procedure.                           - Sedation was administered by an anesthesia                            professional. Deep sedation was attained.                           - Sedation was administered by an anesthesia                             professional. Deep sedation was attained.                           After obtaining informed consent, the endoscope was                            passed under direct vision. Throughout the                            procedure, the patient's blood pressure, pulse, and                            oxygen saturations were monitored continuously. The                            was introduced through the mouth, and advanced to                            the second part of duodenum. The upper GI endoscopy                            was accomplished without difficulty. The patient                            tolerated the procedure well. Scope In: Scope Out: Findings:      The esophagus was normal.      The stomach was normal.      The examined duodenum was normal. Biopsies were taken with a cold       forceps for histology. Impression:               - Normal esophagus.                           - Normal stomach.                           - Normal examined duodenum. Biopsied. Moderate Sedation:      N/A- Per Anesthesia Care Recommendation:           - Patient has a contact number available for  emergencies. The signs and symptoms of potential                            delayed complications were discussed with the                            patient. Return to normal activities tomorrow.                            Written discharge instructions were provided to the                            patient.                           - Resume previous diet.                           - Continue present medications.                           - Await pathology results.                           - Follow up with Dr. Collene Mares in 4 weeks.                           - Trial of scheduled Imodium. Procedure Code(s):        --- Professional ---                           [phone], Esophagogastroduodenoscopy, flexible,                            transoral; with biopsy, single or  multiple Diagnosis Code(s):        --- Professional ---                           R12, Heartburn                           R19.7, Diarrhea, unspecified CPT copyright 2016 American Medical Association. All rights reserved. The codes documented in this report are preliminary and upon coder review may  be revised to meet current compliance requirements. Carol Ada, MD Carol Ada, MD 06/22/2016 2:33:26 PM This report has been signed electronically. Number of Addenda: 0

## 2016-06-22 NOTE — Anesthesia Preprocedure Evaluation (Addendum)
Anesthesia Evaluation  Patient identified by MRN, date of birth, ID band Patient awake    Reviewed: Allergy & Precautions, H&P , NPO status , Patient's Chart, lab work & pertinent test results, reviewed documented beta blocker date and time   Airway Mallampati: III  TM Distance: >3 FB Neck ROM: Full    Dental no notable dental hx. (+) Teeth Intact, Dental Advisory Given   Pulmonary sleep apnea and Continuous Positive Airway Pressure Ventilation ,    Pulmonary exam normal breath sounds clear to auscultation       Cardiovascular hypertension, Pt. on medications and Pt. on home beta blockers  Rhythm:Regular Rate:Normal     Neuro/Psych Anxiety negative neurological ROS  negative psych ROS   GI/Hepatic Neg liver ROS, GERD  Medicated and Controlled,  Endo/Other  diabetes, Type 2, Oral Hypoglycemic AgentsMorbid obesity  Renal/GU negative Renal ROS  negative genitourinary   Musculoskeletal   Abdominal   Peds  Hematology negative hematology ROS (+)   Anesthesia Other Findings   Reproductive/Obstetrics negative OB ROS                           Anesthesia Physical Anesthesia Plan  ASA: III  Anesthesia Plan: MAC   Post-op Pain Management:    Induction: Intravenous  Airway Management Planned: Nasal Cannula  Additional Equipment:   Intra-op Plan:   Post-operative Plan:   Informed Consent: I have reviewed the patients History and Physical, chart, labs and discussed the procedure including the risks, benefits and alternatives for the proposed anesthesia with the patient or authorized representative who has indicated his/her understanding and acceptance.   Dental advisory given  Plan Discussed with: CRNA  Anesthesia Plan Comments:         Anesthesia Quick Evaluation

## 2016-06-22 NOTE — Discharge Instructions (Signed)

## 2016-06-22 NOTE — Op Note (Signed)
[facility] Patient Name: Scott [name] Procedure Date: 06/22/2016 MRN: [id number] Attending MD: Carol Ada , MD Date of Birth: 03-03-1966 CSN: [id number] Age: 50 Admit Type: Outpatient Procedure:                Colonoscopy Indications:              Clinically significant diarrhea of unexplained                            origin, Hematochezia Providers:                Carol Ada, MD, Dustin Flock RN, RN, Elspeth Cho Tech., Technician, Herbie Drape, CRNA Referring MD:              Medicines:                Propofol per Anesthesia Complications:            No immediate complications. Estimated Blood Loss:     Estimated blood loss was minimal. Procedure:                Pre-Anesthesia Assessment:                           - Prior to the procedure, a History and Physical                            was performed, and patient medications and                            allergies were reviewed. The patient's tolerance of                            previous anesthesia was also reviewed. The risks                            and benefits of the procedure and the sedation                            options and risks were discussed with the patient.                            All questions were answered, and informed consent                            was obtained. Prior Anticoagulants: The patient has                            taken no previous anticoagulant or antiplatelet                            agents. ASA Grade Assessment: III - A patient with  severe systemic disease. After reviewing the risks                            and benefits, the patient was deemed in                            satisfactory condition to undergo the procedure.                           - Sedation was administered by an anesthesia                            professional. Deep sedation was attained.                           After  obtaining informed consent, the colonoscope                            was passed under direct vision. Throughout the                            procedure, the patient's blood pressure, pulse, and                            oxygen saturations were monitored continuously. The                            was introduced through the anus and advanced to the                            the cecum, identified by appendiceal orifice and                            ileocecal valve. The colonoscopy was performed                            without difficulty. The patient tolerated the                            procedure well. The quality of the bowel                            preparation was good. The ileocecal valve,                            appendiceal orifice, and rectum were photographed. Scope In: 2:04:54 PM Scope Out: 2:22:08 PM Scope Withdrawal Time: 0 hours 13 minutes 47 seconds  Total Procedure Duration: 0 hours 17 minutes 14 seconds  Findings:      Normal mucosa was found in the entire colon. Biopsies were taken with a       cold forceps for histology. Estimated blood loss was minimal. Impression:               - Normal mucosa in the entire examined colon.  Biopsied. Moderate Sedation:      N/A- Per Anesthesia Care Recommendation:           - Patient has a contact number available for                            emergencies. The signs and symptoms of potential                            delayed complications were discussed with the                            patient. Return to normal activities tomorrow.                            Written discharge instructions were provided to the                            patient.                           - Resume previous diet.                           - Continue present medications.                           - Await pathology results.                           - Repeat colonoscopy in 10 years for surveillance. Procedure  Code(s):        --- Professional ---                           [phone], Colonoscopy, flexible; with biopsy, single                            or multiple Diagnosis Code(s):        --- Professional ---                           R19.7, Diarrhea, unspecified                           K92.1, Melena (includes Hematochezia) CPT copyright 2016 American Medical Association. All rights reserved. The codes documented in this report are preliminary and upon coder review may  be revised to meet current compliance requirements. Carol Ada, MD Carol Ada, MD 06/22/2016 2:30:44 PM This report has been signed electronically. Number of Addenda: 0

## 2016-06-22 NOTE — H&P (Signed)
Scott [name] HPI: this is a 50 year old male with complaints of rectal bleeding.  He reports having loose bowel movements and urgency s/p PO intake.  With his bowel movements he will have hematocehzia.  He was diagnosed with an anal fissure, but is concerned that he may have another issue, I.e., hi hemorrhoids.  In 2014 his colonoscopy was essentially negative.  He stopped his metformin, but this did not help his bowel movements.  Additionally, he has a long history of severe GERD and he takes omeprazole with persistent symptoms.  Past Medical History:  Diagnosis Date  . Anxiety   . Back pain    pinched nerve sciatica  . Diabetes mellitus without complication ([place])    type  2  . Elevated cholesterol with elevated triglycerides   . Elevated liver enzymes 2014   normal now  . GERD (gastroesophageal reflux disease)   . Hypertension   . Sleep apnea    uses cpap,setting of 14    Past Surgical History:  Procedure Laterality Date  . COLONOSCOPY N/A 09/29/2012   Procedure: COLONOSCOPY;  Surgeon: Juanita Craver, MD;  Location: [facility];  Service: Endoscopy;  Laterality: N/A;  . ESOPHAGOGASTRODUODENOSCOPY N/A 09/29/2012   Procedure: ESOPHAGOGASTRODUODENOSCOPY (EGD);  Surgeon: Juanita Craver, MD;  Location: [facility];  Service: Endoscopy;  Laterality: N/A;  . FRACTURE SURGERY     right wrist with screws  . HERNIA REPAIR  2008   inguinal    History reviewed. No pertinent family history.  Social History:  reports that he has never smoked. He has never used smokeless tobacco. He reports that he drinks alcohol. He reports that he uses drugs, including Marijuana.  Allergies:  Allergies  Allergen Reactions  . Metformin And Related Diarrhea    Medications: Scheduled: Continuous:  No results found for this or any previous visit (from the past 24 hour(s)).   No results found.  ROS:  As stated above in the HPI otherwise negative.  There were no vitals taken for this visit.     PE: Gen: NAD, Alert and Oriented HEENT:  /AT, EOMI Neck: Supple, no LAD Lungs: CTA Bilaterally CV: RRR without M/G/R ABM: Soft, NTND, +BS Ext: No C/C/E  Assessment/Plan: 1) Hematochezia. 2) Change in bowel habits. 3) GERD.  Plan: 1) EGD/colonoscopy today.  Scott [name] D 06/22/2016, 1:18 PM

## 2016-06-23 LAB — POCT I-STAT 4, (NA,K, GLUC, HGB,HCT)
Glucose, Bld: 94 mg/dL (ref 65–99)
HCT: 42 % (ref 39.0–52.0)
Hemoglobin: 14.3 g/dL (ref 13.0–17.0)
POTASSIUM: 3 mmol/L — AB (ref 3.5–5.1)
Sodium: 143 mmol/L (ref 135–145)

## 2016-06-25 ENCOUNTER — Encounter (HOSPITAL_COMMUNITY): Payer: Self-pay | Admitting: Gastroenterology

## 2016-07-02 ENCOUNTER — Ambulatory Visit (INDEPENDENT_AMBULATORY_CARE_PROVIDER_SITE_OTHER): Payer: BLUE CROSS/BLUE SHIELD | Admitting: Internal Medicine

## 2016-07-02 ENCOUNTER — Encounter: Payer: Self-pay | Admitting: Internal Medicine

## 2016-07-02 ENCOUNTER — Other Ambulatory Visit: Payer: Self-pay

## 2016-07-02 VITALS — BP 140/88 | HR 77 | Ht 72.0 in | Wt 311.0 lb

## 2016-07-02 DIAGNOSIS — E1165 Type 2 diabetes mellitus with hyperglycemia: Secondary | ICD-10-CM | POA: Diagnosis not present

## 2016-07-02 LAB — POCT GLYCOSYLATED HEMOGLOBIN (HGB A1C): HEMOGLOBIN A1C: 6.1

## 2016-07-02 MED ORDER — GLUCOSE BLOOD VI STRP: ORAL_STRIP | 12 refills | Status: AC

## 2016-07-02 MED ORDER — MICROLET LANCETS MISC: 2 refills | Status: AC

## 2016-07-02 MED ORDER — CANAGLIFLOZIN-METFORMIN HCL ER 50-500 MG PO TB24: 2.0000 | ORAL_TABLET | Freq: Every day | ORAL | 3 refills | Status: DC

## 2016-07-02 NOTE — Patient Instructions (Signed)
Please stop Invokana and Metformin ER and start: - Invokamet 50-500 mg 2 tablets before b'fast  Continue Victoza 1.2 mg before b'fast.  Please return in 1.5 months with your sugar log.   PATIENT INSTRUCTIONS FOR TYPE 2 DIABETES:  **Please join MyChart!** - see attached instructions about how to join if you have not done so already.  DIET AND EXERCISE Diet and exercise is an important part of diabetic treatment.  We recommended aerobic exercise in the form of brisk walking (working between 40-60% of maximal aerobic capacity, similar to brisk walking) for 150 minutes per week (such as 30 minutes five days per week) along with 3 times per week performing 'resistance' training (using various gauge rubber tubes with handles) 5-10 exercises involving the major muscle groups (upper body, lower body and core) performing 10-15 repetitions (or near fatigue) each exercise. Start at half the above goal but build slowly to reach the above goals. If limited by weight, joint pain, or disability, we recommend daily walking in a swimming pool with water up to waist to reduce pressure from joints while allow for adequate exercise.    BLOOD GLUCOSES Monitoring your blood glucoses is important for continued management of your diabetes. Please check your blood glucoses 2-4 times a day: fasting, before meals and at bedtime (you can rotate these measurements - e.g. one day check before the 3 meals, the next day check before 2 of the meals and before bedtime, etc.).   HYPOGLYCEMIA (low blood sugar) Hypoglycemia is usually a reaction to not eating, exercising, or taking too much insulin/ other diabetes drugs.  Symptoms include tremors, sweating, hunger, confusion, headache, etc. Treat IMMEDIATELY with 15 grams of Carbs: . 4 glucose tablets .  cup regular juice/soda . 2 tablespoons raisins . 4 teaspoons sugar . 1 tablespoon honey Recheck blood glucose in 15 mins and repeat above if still symptomatic/blood glucose  <100.  RECOMMENDATIONS TO REDUCE YOUR RISK OF DIABETIC COMPLICATIONS: * Take your prescribed MEDICATION(S) * Follow a DIABETIC diet: Complex carbs, fiber rich foods, (monounsaturated and polyunsaturated) fats * AVOID saturated/trans fats, high fat foods, >2,300 mg salt per day. * EXERCISE at least 5 times a week for 30 minutes or preferably daily.  * DO NOT SMOKE OR DRINK more than 1 drink a day. * Check your FEET every day. Do not wear tightfitting shoes. Contact us if you develop an ulcer * See your EYE doctor once a year or more if needed * Get a FLU shot once a year * Get a PNEUMONIA vaccine once before and once after age 82 years  GOALS:  * Your Hemoglobin A1c of <7%  * fasting sugars need to be <130 * after meals sugars need to be <180 (2h after you start eating) * Your Systolic BP should be [id number] or lower  * Your Diastolic BP should be 80 or lower  * Your HDL (Good Cholesterol) should be 40 or higher  * Your LDL (Bad Cholesterol) should be 100 or lower. * Your Triglycerides should be 150 or lower  * Your Urine microalbumin (kidney function) should be <30 * Your Body Mass Index should be 25 or lower    Please consider the following ways to cut down carbs and fat and increase fiber and micronutrients in your diet: - substitute whole grain for white bread or pasta - substitute brown rice for white rice - substitute 90-calorie flat bread pieces for slices of bread when possible - substitute sweet potatoes or yams for white  potatoes - substitute humus for margarine - substitute tofu for cheese when possible - substitute almond or rice milk for regular milk (would not drink soy milk daily due to concern for soy estrogen influence on breast cancer risk) - substitute dark chocolate for other sweets when possible - substitute water - can add lemon or orange slices for taste - for diet sodas (artificial sweeteners will trick your body that you can eat sweets without getting calories and  will lead you to overeating and weight gain in the long run) - do not skip breakfast or other meals (this will slow down the metabolism and will result in more weight gain over time)  - can try smoothies made from fruit and almond/rice milk in am instead of regular breakfast - can also try old-fashioned (not instant) oatmeal made with almond/rice milk in am - order the dressing on the side when eating salad at a restaurant (pour less than half of the dressing on the salad) - eat as little meat as possible - can try juicing, but should not forget that juicing will get rid of the fiber, so would alternate with eating raw veg./fruits or drinking smoothies - use as little oil as possible, even when using olive oil - can dress a salad with a mix of balsamic vinegar and lemon juice, for e.g. - use agave nectar, stevia sugar, or regular sugar rather than artificial sweateners - steam or broil/roast veggies  - snack on veggies/fruit/nuts (unsalted, preferably) when possible, rather than processed foods - reduce or eliminate aspartame in diet (it is in diet sodas, chewing gum, etc) Read the labels!  Try to read Dr. Janene Harvey book: "Program for Reversing Diabetes" for other ideas for healthy eating.

## 2016-07-02 NOTE — Progress Notes (Signed)
Patient ID: Scott [name], male   DOB: 1966-02-05, 50 y.o.   MRN: [id number]   HPI: Scott [name] is a 50 y.o.-year-old male, referred by his PCP, Dr. Shirline Frees, for management of DM2, dx in 2008, non-insulin-dependent, uncontrolled, without long term complications.  Last hemoglobin A1c was: 03/19/2016: HbA1c 6.3% 12/21/2015: HbA1c 7.4% 06/03/2015: HbA1c 6.1% 12/01/2014: HbA1c 6.7% 06/01/2014: HbA1c 7.0%  Pt is on a regimen of: - Metformin ER 500 mg 2x a day >> diarrhea off and on - INVOKANA 100 mg daily - Victoza 1.2 mg daily He was on Trulicity >> weakness, "I felt like I was dying"  Pt checks his sugars 1x a day and they are: - am: 94-140, 159 - 2h after b'fast: n/c - before lunch: n/c - 2h after lunch: n/c - before dinner: n/c - 2h after dinner: n/c - bedtime: n/c - nighttime: n/c No lows. Lowest sugar was 94; ? hypoglycemia awareness. Highest sugar was 159.  Glucometer: One Touch Ultra - not covered  Pt's meals are: - Breakfast: coffee + milk - skim; 2 slices of bread - Lunch: [place] food, fast food - Dinner: soup, salad, cereal  - Snacks: none  - no CKD, last BUN/creatinine:  01/18/2016: 12/0.88 No results found for: BUN, CREATININE  12/21/2015: ACR <30. He is on lisinopril. - last set of lipids: 06/03/2015: 122/153/35/56 No results found for: CHOL, HDL, LDLCALC, LDLDIRECT, TRIG, CHOLHDL - last eye exam was in 03/2016. No DR. + Glaucoma - had 1 surgery for this. Has 33% vision in L eye.  - no numbness and tingling in his feet. He is on Neurontin for back pain.  Pt has FH of DM in mother.  ROS: Constitutional: + weight loss, + fatigue, + subjective hyperthermia, + poor sleep, + excessive urination Eyes: no blurry vision, no xerophthalmia ENT: no sore throat, no nodules palpated in throat, no dysphagia/odynophagia, no hoarseness Cardiovascular: no CP/SOB/+ palpitations/no leg swelling Respiratory: no cough/SOB Gastrointestinal: no N/V/+  D/no C Musculoskeletal: no muscle/joint aches Skin: no rashes, + hair loss Neurological: no tremors/numbness/tingling/dizziness Psychiatric: no depression/+ anxiety + Enlarged breasts  Past Medical History:  Diagnosis Date  . Anxiety   . Back pain    pinched nerve sciatica  . Diabetes mellitus without complication ([place])    type  2  . Elevated cholesterol with elevated triglycerides   . Elevated liver enzymes 2014   normal now  . GERD (gastroesophageal reflux disease)   . Hypertension   . Sleep apnea    uses cpap,setting of 14   Past Surgical History:  Procedure Laterality Date  . COLONOSCOPY N/A 09/29/2012   Procedure: COLONOSCOPY;  Surgeon: Juanita Craver, MD;  Location: [facility];  Service: Endoscopy;  Laterality: N/A;  . COLONOSCOPY WITH PROPOFOL N/A 06/22/2016   Procedure: COLONOSCOPY WITH PROPOFOL;  Surgeon: Carol Ada, MD;  Location: [facility];  Service: Endoscopy;  Laterality: N/A;  . ESOPHAGOGASTRODUODENOSCOPY N/A 09/29/2012   Procedure: ESOPHAGOGASTRODUODENOSCOPY (EGD);  Surgeon: Juanita Craver, MD;  Location: [facility];  Service: Endoscopy;  Laterality: N/A;  . ESOPHAGOGASTRODUODENOSCOPY (EGD) WITH PROPOFOL N/A 06/22/2016   Procedure: ESOPHAGOGASTRODUODENOSCOPY (EGD) WITH PROPOFOL;  Surgeon: Carol Ada, MD;  Location: [facility];  Service: Endoscopy;  Laterality: N/A;  . FRACTURE SURGERY     right wrist with screws  . HERNIA REPAIR  2008   inguinal   Social History   Social History  . Marital status: Married    Spouse name: N/A  . Number of children: 4   Occupational History  .  Photographer/videographer    Social History Main Topics  . Smoking status: Never Smoker  . Smokeless tobacco: Never Used  . Alcohol use Yes     Comment: Beer 2-3 drinks weekly   . Drug use:     Types: Marijuana   Current Outpatient Prescriptions on File Prior to Visit  Medication Sig Dispense Refill  . atenolol-chlorthalidone (TENORETIC) 100-25 MG per tablet Take 1  tablet by mouth daily.    Marland Kitchen atorvastatin (LIPITOR) 20 MG tablet Take 20 mg by mouth every evening.     . busPIRone (BUSPAR) 10 MG tablet Take 1 tablet by mouth 2 (two) times daily.  0  . canagliflozin (INVOKANA) 100 MG TABS tablet Take 100 mg by mouth daily before breakfast.    . fish oil-omega-3 fatty acids 1000 MG capsule Take 2 g by mouth every evening.     . fluticasone (FLONASE) 50 MCG/ACT nasal spray Place 1 spray into both nostrils at bedtime.     . gabapentin (NEURONTIN) 300 MG capsule Take 300 mg by mouth 3 (three) times daily.    . hydrocortisone (ANUSOL-HC) 2.5 % rectal cream Place 1 application rectally 2 (two) times daily.   0  . hydrOXYzine (ATARAX/VISTARIL) 25 MG tablet Take 1 tablet by mouth at bedtime as needed (sleep).   0  . lidocaine (XYLOCAINE) 5 % ointment Apply 1 application topically daily as needed for mild pain.   5  . liraglutide (VICTOZA) 18 MG/3ML SOPN Inject 1.2 mg into the skin every evening.    Marland Kitchen lisinopril (PRINIVIL,ZESTRIL) 2.5 MG tablet Take 2.5 mg by mouth every evening.     Marland Kitchen LORazepam (ATIVAN) 0.5 MG tablet Take 0.5 mg by mouth every 8 (eight) hours as needed for anxiety.     Marland Kitchen omeprazole (PRILOSEC) 20 MG capsule Take 20 mg by mouth daily.     No current facility-administered medications on file prior to visit.    Allergies  Allergen Reactions  . Metformin And Related Diarrhea   Family History  Problem Relation Age of Onset  . Diabetes Mother   . Hypertension Mother   . Hyperlipidemia Mother   . Cancer Father   . Thyroid disease Sister     PE: BP 140/88   Pulse 77   Ht 6' (1.829 m)   Wt (!) 311 lb (141.1 kg)   SpO2 96%   BMI 42.18 kg/m  Wt Readings from Last 3 Encounters:  07/02/16 (!) 311 lb (141.1 kg)  06/22/16 (!) 310 lb (140.6 kg)  08/01/15 (!) 310 lb (140.6 kg)   Constitutional: Obese, in NAD Eyes: PERRLA, EOMI, no exophthalmos ENT: moist mucous membranes, no thyromegaly, no cervical lymphadenopathy Cardiovascular: RRR, No  MRG Respiratory: CTA B Gastrointestinal: abdomen soft, NT, ND, BS+ Musculoskeletal: no deformities, strength intact in all 4 Skin: moist, warm, no rashes Neurological: no tremor with outstretched hands, DTR normal in all 4  ASSESSMENT: 1. DM2, non-insulin-dependent, uncontrolled, without long-termcomplications, but with hyperglycemia   PLAN:  1. Patient with long-standing, fairly well controlled diabetes, on oral + injectable antidiabetic regimen - HbA1c today improved: 6.1%. He is not tolerating his metformin really well, having diarrhea, gas, and abdominal discomfort. We discussed about starting Glumetza which is much better tolerated. Usually there is a very expensive medication, however it is covered by his insurance if part of Invokamet. I sent a prescription for this to his pharmacy and given him a discount card. For now, will continue Victoza at the current dose. We discussed about starting  to check sugars later in the day also. We also discussed about improving his diet, to a more planned-based regimen.  - I suggested to:  Patient Instructions  Please stop Invokana and Metformin ER and start: - Invokamet 50-500 mg 2 tablets before b'fast  Continue Victoza 1.2 mg before b'fast.  Please return in 1.5 months with your sugar log.   - Strongly advised him to start checking sugars at different times of the day - check once a day, rotating checks - we gave him [employer] Contour Link meter and sent a prescription for strips and lancets to his pharmacy - given sugar log and advised how to fill it and to bring it at next appt  - given foot care handout and explained the principles  - given instructions for hypoglycemia management "15-15 rule"  - advised for yearly eye exams  - Return to clinic in 1.5 mo with sugar log   Philemon Kingdom, MD PhD [facility] Endocrinology

## 2016-08-14 ENCOUNTER — Ambulatory Visit: Payer: BLUE CROSS/BLUE SHIELD | Admitting: Internal Medicine

## 2016-10-01 ENCOUNTER — Ambulatory Visit: Payer: BLUE CROSS/BLUE SHIELD | Admitting: Internal Medicine

## 2018-01-07 ENCOUNTER — Encounter (HOSPITAL_COMMUNITY): Payer: Self-pay

## 2018-01-07 ENCOUNTER — Other Ambulatory Visit: Payer: Self-pay

## 2018-01-07 ENCOUNTER — Emergency Department (HOSPITAL_COMMUNITY)
Admission: EM | Admit: 2018-01-07 | Discharge: 2018-01-07 | Disposition: A | Discharge status: Home / Self Care | Payer: BLUE CROSS/BLUE SHIELD | Attending: Emergency Medicine | Admitting: Emergency Medicine

## 2018-01-07 DIAGNOSIS — Z7984 Long term (current) use of oral hypoglycemic drugs: Secondary | ICD-10-CM | POA: Insufficient documentation

## 2018-01-07 DIAGNOSIS — E119 Type 2 diabetes mellitus without complications: Secondary | ICD-10-CM | POA: Insufficient documentation

## 2018-01-07 DIAGNOSIS — I10 Essential (primary) hypertension: Secondary | ICD-10-CM | POA: Insufficient documentation

## 2018-01-07 DIAGNOSIS — Z79899 Other long term (current) drug therapy: Secondary | ICD-10-CM | POA: Diagnosis not present

## 2018-01-07 DIAGNOSIS — K644 Residual hemorrhoidal skin tags: Secondary | ICD-10-CM | POA: Diagnosis not present

## 2018-01-07 DIAGNOSIS — K6289 Other specified diseases of anus and rectum: Secondary | ICD-10-CM | POA: Diagnosis present

## 2018-01-07 DIAGNOSIS — F419 Anxiety disorder, unspecified: Secondary | ICD-10-CM | POA: Diagnosis not present

## 2018-01-07 MED ORDER — DOCUSATE SODIUM 100 MG PO CAPS: 100.0000 mg | ORAL_CAPSULE | Freq: Two times a day (BID) | ORAL | 0 refills | Status: DC

## 2018-01-07 MED ORDER — BENZOCAINE 20 % RE OINT: TOPICAL_OINTMENT | RECTAL | 0 refills | Status: DC | PRN

## 2018-01-07 NOTE — ED Provider Notes (Signed)
[facility] EMERGENCY DEPARTMENT Provider Note   CSN: [id number] Arrival date & time: 01/07/18  1010     History   Chief Complaint Chief Complaint  Patient presents with  . Rectal Pain    HPI Scott [name] is a 52 y.o. male.  HPI  The patient is a 52 year old male, he presents to the hospital with a complaint of rectal pain.  He states this started yesterday, it started a short time after he had some diarrhea episodes over the weekend.  Of note the patient is lactose intolerant.  He does have a history of prior internal hemorrhoids for which she has used hydrocortisone injectable foam.  He denies any bleeding at this time though he does have itching and discomfort in the perirectal area.  Past Medical History:  Diagnosis Date  . Anxiety   . Back pain    pinched nerve sciatica  . Diabetes mellitus without complication ([place])    type  2  . Elevated cholesterol with elevated triglycerides   . Elevated liver enzymes 2014   normal now  . GERD (gastroesophageal reflux disease)   . Hypertension   . Hypertension   . Sleep apnea    uses cpap,setting of 14    Patient Active Problem List   Diagnosis Date Noted  . Palpitations 02/21/2016    Past Surgical History:  Procedure Laterality Date  . COLONOSCOPY N/A 09/29/2012   Procedure: COLONOSCOPY;  Surgeon: Juanita Craver, MD;  Location: [facility];  Service: Endoscopy;  Laterality: N/A;  . COLONOSCOPY WITH PROPOFOL N/A 06/22/2016   Procedure: COLONOSCOPY WITH PROPOFOL;  Surgeon: Carol Ada, MD;  Location: [facility];  Service: Endoscopy;  Laterality: N/A;  . ESOPHAGOGASTRODUODENOSCOPY N/A 09/29/2012   Procedure: ESOPHAGOGASTRODUODENOSCOPY (EGD);  Surgeon: Juanita Craver, MD;  Location: [facility];  Service: Endoscopy;  Laterality: N/A;  . ESOPHAGOGASTRODUODENOSCOPY (EGD) WITH PROPOFOL N/A 06/22/2016   Procedure: ESOPHAGOGASTRODUODENOSCOPY (EGD) WITH PROPOFOL;  Surgeon: Carol Ada, MD;  Location: [facility];  Service:  Endoscopy;  Laterality: N/A;  . FRACTURE SURGERY     right wrist with screws  . HERNIA REPAIR  2008   inguinal        Home Medications    Prior to Admission medications   Medication Sig Start Date End Date Taking? Authorizing Provider  atenolol-chlorthalidone (TENORETIC) 100-25 MG per tablet Take 1 tablet by mouth daily.    [provider]  atorvastatin (LIPITOR) 20 MG tablet Take 20 mg by mouth every evening.     [provider]  benzocaine (AMERICAINE) 20 % rectal ointment Place rectally every 3 (three) hours as needed for pain. 01/07/18   Noemi Chapel, MD  busPIRone (BUSPAR) 10 MG tablet Take 1 tablet by mouth 2 (two) times daily. 07/07/15   [provider]  Canagliflozin-Metformin HCl ER (INVOKAMET XR) 50-500 MG TB24 Take 2 tablets by mouth daily before breakfast. 07/02/16   Philemon Kingdom, MD  docusate sodium (COLACE) 100 MG capsule Take 1 capsule (100 mg total) by mouth every 12 (twelve) hours. 01/07/18   Noemi Chapel, MD  fish oil-omega-3 fatty acids 1000 MG capsule Take 2 g by mouth every evening.     [provider]  fluticasone (FLONASE) 50 MCG/ACT nasal spray Place 1 spray into both nostrils at bedtime.     [provider]  gabapentin (NEURONTIN) 300 MG capsule Take 300 mg by mouth 3 (three) times daily.    [provider]  glucose blood test strip Use to check blood sugar once a  day. 07/02/16   Philemon Kingdom, MD  hydrocortisone (ANUSOL-HC) 2.5 % rectal cream Place 1 application rectally 2 (two) times daily.  06/09/16   [provider]  hydrOXYzine (ATARAX/VISTARIL) 25 MG tablet Take 1 tablet by mouth at bedtime as needed (sleep).  07/04/15   [provider]  lidocaine (XYLOCAINE) 5 % ointment Apply 1 application topically daily as needed for mild pain.  05/08/16   [provider]  liraglutide (VICTOZA) 18 MG/3ML SOPN Inject 1.2 mg into the skin every evening.    [provider]    lisinopril (PRINIVIL,ZESTRIL) 2.5 MG tablet Take 2.5 mg by mouth every evening.     [provider]  LORazepam (ATIVAN) 0.5 MG tablet Take 0.5 mg by mouth every 8 (eight) hours as needed for anxiety.     [provider]  MICROLET LANCETS MISC Use to check blood sugar. 07/02/16   Philemon Kingdom, MD  omeprazole (PRILOSEC) 20 MG capsule Take 20 mg by mouth daily.    [provider]    Family History Family History  Problem Relation Age of Onset  . Diabetes Mother   . Hypertension Mother   . Hyperlipidemia Mother   . Cancer Father   . Thyroid disease Sister     Social History Social History   Tobacco Use  . Smoking status: Never Smoker  . Smokeless tobacco: Never Used  Substance Use Topics  . Alcohol use: Yes    Comment: rarely  . Drug use: Yes    Types: Marijuana    Comment: rare marijuana use only last used 3 months ago     Allergies   Metformin and related and Trulicity [dulaglutide]   Review of Systems Review of Systems  Constitutional: Negative for fever.  Gastrointestinal: Positive for rectal pain.  Genitourinary: Negative for difficulty urinating.     Physical Exam Updated Vital Signs BP 124/86 (BP Location: Left Arm)   Pulse 75   Temp 98.3 F (36.8 C) (Oral)   Resp 20   Ht 6\' 1"  (1.854 m)   Wt (!) 138.3 kg (305 lb)   SpO2 100%   BMI 40.24 kg/m   Physical Exam  Constitutional: He appears well-developed and well-nourished.  HENT:  Head: Normocephalic and atraumatic.  Eyes: Conjunctivae are normal. Right eye exhibits no discharge. Left eye exhibits no discharge.  Pulmonary/Chest: Effort normal. No respiratory distress.  Genitourinary:  Genitourinary Comments: The patient has a left perirectal external hemorrhoid nonthrombosed, mildly tender, nonbleeding, very soft.  No anal fissures  Neurological: He is alert. Coordination normal.  Skin: Skin is warm and dry. No rash noted. He is not diaphoretic. No erythema.   Psychiatric: He has a normal mood and affect.  Nursing note and vitals reviewed.    ED Treatments / Results  Labs (all labs ordered are listed, but only abnormal results are displayed) Labs Reviewed - No data to display  EKG None  Radiology No results found.  Procedures Procedures (including critical care time)  Medications Ordered in ED Medications - No data to display   Initial Impression / Assessment and Plan / ED Course  I have reviewed the triage vital signs and the nursing notes.  Pertinent labs & imaging results that were available during my care of the patient were reviewed by me and considered in my medical decision making (see chart for details).    Visual diagnosis of the left external nonthrombosed nonbleeding hemorrhoid.  The patient will be treated conservatively with medications and referred to  outpatient gastroenterology therapy follow-up.  He has been seen by gastroenterology in the distant past for colonoscopy, no recent follow-up  Final Clinical Impressions(s) / ED Diagnoses   Final diagnoses:  External hemorrhoid    ED Discharge Orders        Ordered    docusate sodium (COLACE) 100 MG capsule  Every 12 hours     01/07/18 1035    benzocaine (AMERICAINE) 20 % rectal ointment  Every  3 hours PRN     01/07/18 1037       Noemi Chapel, MD 01/07/18 1038

## 2018-01-07 NOTE — ED Triage Notes (Signed)
Pt reports severe rectal pain.  Describes a "bulge" from his rectum.  Reports history of internal hemorrhoids.  Pt says he notices blood when he wipes.

## 2018-01-07 NOTE — Discharge Instructions (Signed)
You may try the benzocaine ointment to help with rectal pain, please keep the stool soft with Colace twice a day, you may continue to use your hemorrhoid ointment as well.  Please see the GI doctor within 2 weeks if no better, emergency department for severe pain or bleeding.  Avoid taking opiate medicine such as oxycodone as this will cause constipation.

## 2019-06-19 ENCOUNTER — Other Ambulatory Visit: Payer: Self-pay

## 2019-06-19 DIAGNOSIS — Z20822 Contact with and (suspected) exposure to covid-19: Secondary | ICD-10-CM

## 2019-06-22 LAB — NOVEL CORONAVIRUS, NAA: SARS-CoV-2, NAA: NOT DETECTED

## 2019-10-20 ENCOUNTER — Other Ambulatory Visit: Payer: Self-pay | Admitting: Family Medicine

## 2019-10-20 DIAGNOSIS — M5416 Radiculopathy, lumbar region: Secondary | ICD-10-CM

## 2019-10-28 ENCOUNTER — Ambulatory Visit
Admission: RE | Admit: 2019-10-28 | Discharge: 2019-10-28 | Disposition: A | Discharge status: Home / Self Care | Payer: BLUE CROSS/BLUE SHIELD | Source: Ambulatory Visit | Attending: Family Medicine | Admitting: Family Medicine

## 2019-10-28 ENCOUNTER — Other Ambulatory Visit: Payer: Self-pay

## 2019-10-28 DIAGNOSIS — M5416 Radiculopathy, lumbar region: Secondary | ICD-10-CM

## 2019-11-02 ENCOUNTER — Ambulatory Visit
Admission: EM | Admit: 2019-11-02 | Discharge: 2019-11-02 | Disposition: A | Discharge status: Home / Self Care | Payer: 59

## 2019-11-02 ENCOUNTER — Encounter: Payer: Self-pay | Admitting: Emergency Medicine

## 2019-11-02 ENCOUNTER — Other Ambulatory Visit: Payer: Self-pay

## 2019-11-02 DIAGNOSIS — H60392 Other infective otitis externa, left ear: Secondary | ICD-10-CM

## 2019-11-02 DIAGNOSIS — H9202 Otalgia, left ear: Secondary | ICD-10-CM

## 2019-11-02 MED ORDER — AMOXICILLIN 500 MG PO CAPS: 500.0000 mg | ORAL_CAPSULE | Freq: Two times a day (BID) | ORAL | 0 refills | Status: AC

## 2019-11-02 MED ORDER — NEOMYCIN-POLYMYXIN-HC 3.5-10000-1 OT SUSP: 3.0000 [drp] | Freq: Three times a day (TID) | OTIC | 0 refills | Status: AC

## 2019-11-02 NOTE — ED Triage Notes (Signed)
Patient reports bubbling sound in left ear.  Intermittent pain.    Denies cough, cold or runny nose

## 2019-11-02 NOTE — Discharge Instructions (Signed)
Rest and drink plenty of fluids Prescribed cortisporin ear drops.  Use as directed and to completion Paper prescription for amoxicillin prescribed.  Fill in 2-3 days if symptoms are not improving with ear drops Continue with allergy medications Continue to use OTC ibuprofen and/ or tylenol as needed for pain control Follow up with PCP if symptoms persists Return here or go to the ER if you have any new or worsening symptoms fever, chills, nausea, vomiting, redness, swelling, discharge, worsening symptoms despite medication, etc..Marland Kitchen

## 2019-11-02 NOTE — ED Provider Notes (Signed)
[facility]   [id number] 11/02/19 Arrival Time: [id number]  CC: Hearing changes  SUBJECTIVE: History from: patient.  Scott [name] is a 54 y.o. male who presents with of left ear hearing changes x 3 days.  Denies a precipitating event, such as swimming or wearing ear plugs.  Patient describes as getting water in his ear.  Denies alleviating factors.  Worse with weather changes.  Denies fever, chills, fatigue, sinus pain, rhinorrhea, ear discharge, sore throat, SOB, wheezing, chest pain, nausea, changes in bowel or bladder habits.    Go on vacation to [facility] this upcoming weekend.    ROS: As per HPI.  All other pertinent ROS negative.     Past Medical History:  Diagnosis Date  . Anxiety   . Back pain    pinched nerve sciatica  . Diabetes mellitus without complication ([place])    type  2  . Elevated cholesterol with elevated triglycerides   . Elevated liver enzymes 2014   normal now  . GERD (gastroesophageal reflux disease)   . Hypertension   . Hypertension   . Sleep apnea    uses cpap,setting of 14   Past Surgical History:  Procedure Laterality Date  . COLONOSCOPY N/A 09/29/2012   Procedure: COLONOSCOPY;  Surgeon: Juanita Craver, MD;  Location: [facility];  Service: Endoscopy;  Laterality: N/A;  . COLONOSCOPY WITH PROPOFOL N/A 06/22/2016   Procedure: COLONOSCOPY WITH PROPOFOL;  Surgeon: Carol Ada, MD;  Location: [facility];  Service: Endoscopy;  Laterality: N/A;  . ESOPHAGOGASTRODUODENOSCOPY N/A 09/29/2012   Procedure: ESOPHAGOGASTRODUODENOSCOPY (EGD);  Surgeon: Juanita Craver, MD;  [facility];  Service: Endoscopy;  Laterality: N/A;  . ESOPHAGOGASTRODUODENOSCOPY (EGD) WITH PROPOFOL N/A 06/22/2016   Procedure: ESOPHAGOGASTRODUODENOSCOPY (EGD) WITH PROPOFOL;  Surgeon: Carol Ada, MD;  [facility];  Service: Endoscopy;  Laterality: N/A;  . FRACTURE SURGERY     right wrist with screws  . HERNIA REPAIR  2008   inguinal   Allergies  Allergen  Reactions  . Metformin And Related Diarrhea  . Trulicity [Dulaglutide] Other (See Comments)    Weakness - severe   No current facility-administered medications on file prior to encounter.   Current Outpatient Medications on File Prior to Encounter  Medication Sig Dispense Refill  . atenolol-chlorthalidone (TENORETIC) 100-25 MG per tablet Take 1 tablet by mouth daily.    Marland Kitchen atorvastatin (LIPITOR) 20 MG tablet Take 20 mg by mouth every evening.     . busPIRone (BUSPAR) 10 MG tablet Take 1 tablet by mouth 2 (two) times daily.  0  . Canagliflozin-Metformin HCl ER (INVOKAMET XR) 50-500 MG TB24 Take 2 tablets by mouth daily before breakfast. 60 tablet 3  . fish oil-omega-3 fatty acids 1000 MG capsule Take 2 g by mouth every evening.     . fluticasone (FLONASE) 50 MCG/ACT nasal spray Place 1 spray into both nostrils at bedtime.     . gabapentin (NEURONTIN) 300 MG capsule Take 300 mg by mouth 3 (three) times daily.    Marland Kitchen LORazepam (ATIVAN) 0.5 MG tablet Take 0.5 mg by mouth every 8 (eight) hours as needed for anxiety.     Marland Kitchen omeprazole (PRILOSEC) 20 MG capsule Take 20 mg by mouth daily.    . benzocaine (AMERICAINE) 20 % rectal ointment Place rectally every 3 (three) hours as needed for pain. 28.4 g 0  . docusate sodium (COLACE) 100 MG capsule Take 1 capsule (100 mg total) by mouth every 12 (twelve) hours. 30 capsule 0  . glucose blood test  strip Use to check blood sugar once a day. 100 each 12  . hydrocortisone (ANUSOL-HC) 2.5 % rectal cream Place 1 application rectally 2 (two) times daily.   0  . hydrOXYzine (ATARAX/VISTARIL) 25 MG tablet Take 1 tablet by mouth at bedtime as needed (sleep).   0  . lisinopril (PRINIVIL,ZESTRIL) 2.5 MG tablet Take 2.5 mg by mouth every evening.     Marland Kitchen MICROLET LANCETS MISC Use to check blood sugar. 100 each 2  . RYBELSUS 3 MG TABS Take 1 tablet by mouth every morning.    . [DISCONTINUED] liraglutide (VICTOZA) 18 MG/3ML SOPN Inject 1.2 mg into the skin every evening.       Social History   Socioeconomic History  . Marital status: Married    Spouse name: Not on file  . Number of children: Not on file  . Years of education: Not on file  . Highest education level: Not on file  Occupational History  . Not on file  Tobacco Use  . Smoking status: Never Smoker  . Smokeless tobacco: Never Used  Substance and Sexual Activity  . Alcohol use: Yes    Comment: rarely  . Drug use: Yes    Types: Marijuana    Comment: rare marijuana use only last used 3 months ago  . Sexual activity: Yes  Other Topics Concern  . Not on file  Social History Narrative  . Not on file   Social Determinants of Health   Financial Resource Strain:   . Difficulty of Paying Living Expenses:   Food Insecurity:   . Worried About Charity fundraiser in the Last Year:   . Arboriculturist in the Last Year:   Transportation Needs:   . Film/video editor (Medical):   Marland Kitchen Lack of Transportation (Non-Medical):   Physical Activity:   . Days of Exercise per Week:   . Minutes of Exercise per Session:   Stress:   . Feeling of Stress :   Social Connections:   . Frequency of Communication with Friends and Family:   . Frequency of Social Gatherings with Friends and Family:   . Attends Religious Services:   . Active Member of Clubs or Organizations:   . Attends Archivist Meetings:   Marland Kitchen Marital Status:   Intimate Partner Violence:   . Fear of Current or Ex-Partner:   . Emotionally Abused:   Marland Kitchen Physically Abused:   . Sexually Abused:    Family History  Problem Relation Age of Onset  . Diabetes Mother   . Hypertension Mother   . Hyperlipidemia Mother   . Cancer Father   . Thyroid disease Sister     OBJECTIVE:  Vitals:   11/02/19 1254  BP: 111/75  Pulse: 72  Resp: 18  Temp: 98.9 F (37.2 C)  SpO2: 96%    General appearance: alert; well-appearing, nontoxic; speaking in full sentences and tolerating own secretions HEENT: NCAT; Ears: RT EAC clear, LT EAC mildly  swollen and erythematous; TMs pearly gray; Eyes: PERRL.  EOM grossly intact.Nose: nares patent without rhinorrhea, Throat: oropharynx clear, tonsils non erythematous or enlarged, uvula midline  Neck: supple without LAD Lungs: unlabored respirations, symmetrical air entry; cough: absent; no respiratory distress; CTAB Heart: regular rate and rhythm.  Skin: warm and dry Psychological: alert and cooperative; normal mood and affect; pleasant   ASSESSMENT & PLAN:  1. Other infective acute otitis externa of left ear   2. Ear pain, left     Meds  ordered this encounter  Medications  . neomycin-polymyxin-hydrocortisone (CORTISPORIN) 3.5-10000-1 OTIC suspension    Sig: Place 3 drops into the left ear 3 (three) times daily for 10 days.    Dispense:  10 mL    Refill:  0    Order Specific Question:   Supervising Provider    Answer:   Raylene Everts [id number]  . amoxicillin (AMOXIL) 500 MG capsule    Sig: Take 1 capsule (500 mg total) by mouth 2 (two) times daily for 10 days.    Dispense:  20 capsule    Refill:  0    Order Specific Question:   Supervising Provider    Answer:   Raylene Everts [id number]    Rest and drink plenty of fluids Prescribed cortisporin ear drops.  Use as directed and to completion Paper prescription for amoxicillin prescribed.  Fill in 2-3 days if symptoms are not improving with ear drops Continue with allergy medications Continue to use OTC ibuprofen and/ or tylenol as needed for pain control Follow up with PCP if symptoms persists Return here or go to the ER if you have any new or worsening symptoms fever, chills, nausea, vomiting, redness, swelling, discharge, worsening symptoms despite medication, etc...  Reviewed expectations re: course of current medical issues. Questions answered. Outlined signs and symptoms indicating need for more acute intervention. Patient verbalized understanding. After Visit Summary given.         Lestine Box,  PA-C 11/02/19 1322

## 2019-12-01 ENCOUNTER — Encounter (HOSPITAL_COMMUNITY): Payer: Self-pay

## 2019-12-01 ENCOUNTER — Other Ambulatory Visit: Payer: Self-pay

## 2019-12-01 ENCOUNTER — Ambulatory Visit (HOSPITAL_COMMUNITY): Payer: 59 | Attending: Physician Assistant

## 2019-12-01 DIAGNOSIS — R29898 Other symptoms and signs involving the musculoskeletal system: Secondary | ICD-10-CM | POA: Insufficient documentation

## 2019-12-01 DIAGNOSIS — R2689 Other abnormalities of gait and mobility: Secondary | ICD-10-CM | POA: Diagnosis present

## 2019-12-01 DIAGNOSIS — G8929 Other chronic pain: Secondary | ICD-10-CM | POA: Insufficient documentation

## 2019-12-01 DIAGNOSIS — M545 Low back pain: Secondary | ICD-10-CM | POA: Insufficient documentation

## 2019-12-01 NOTE — Therapy (Signed)
[place] [place], [place], [address] Phone: [phone]   Fax:  [phone]  Physical Therapy Evaluation  Patient Details  Name: Scott [name] MRN: [id number] Date of Birth: 1966-05-06 Referring Provider (PT): Fenton Foy, PA   Encounter Date: 12/01/2019  PT End of Session - 12/01/19 1443    Visit Number  1    Number of Visits  6    Date for PT Re-Evaluation  01/15/20    Authorization Type  [employer]    Authorization Time Period  12/01/19 to 01/15/20    Authorization - Visit Number  1    Authorization - Number of Visits  30   yearly limit PT/OT/chiro combined, 0 used   Progress Note Due on Visit  6    PT Start Time  1340    PT Stop Time  1430    PT Time Calculation (min)  50 min    Activity Tolerance  Patient tolerated treatment well    Behavior During Therapy  [facility] for tasks assessed/performed       Past Medical History:  Diagnosis Date  . Anxiety   . Back pain    pinched nerve sciatica  . Diabetes mellitus without complication ([place])    type  2  . Elevated cholesterol with elevated triglycerides   . Elevated liver enzymes 2014   normal now  . GERD (gastroesophageal reflux disease)   . Hypertension   . Hypertension   . Sleep apnea    uses cpap,setting of 14    Past Surgical History:  Procedure Laterality Date  . COLONOSCOPY N/A 09/29/2012   Procedure: COLONOSCOPY;  Surgeon: Juanita Craver, MD;  [facility];  Service: Endoscopy;  Laterality: N/A;  . COLONOSCOPY WITH PROPOFOL N/A 06/22/2016   Procedure: COLONOSCOPY WITH PROPOFOL;  Surgeon: Carol Ada, MD;  [facility];  Service: Endoscopy;  Laterality: N/A;  . ESOPHAGOGASTRODUODENOSCOPY N/A 09/29/2012   Procedure: ESOPHAGOGASTRODUODENOSCOPY (EGD);  Surgeon: Juanita Craver, MD;  [facility];  Service: Endoscopy;  Laterality: N/A;  . ESOPHAGOGASTRODUODENOSCOPY (EGD) WITH PROPOFOL N/A 06/22/2016   Procedure:  ESOPHAGOGASTRODUODENOSCOPY (EGD) WITH PROPOFOL;  Surgeon: Carol Ada, MD;  [facility];  Service: Endoscopy;  Laterality: N/A;  . FRACTURE SURGERY     right wrist with screws  . HERNIA REPAIR  2008   inguinal    There were no vitals filed for this visit.   Subjective Assessment - 12/01/19 1344    Subjective  Pt reports LBP started 2 years ago mainly on R side; radiates to R posterior thigh as burning feeling. Pt reports pain symptoms increase with prolonged standing. Pt reports saw chiropractor in [place], [place] last week for x-rays but is not returning because no one is wearing a mask in the facility; reports they told him is spine curves (therapist unable to see this imaging but MRI does not have scoliosis reports). Pt reports taking gabapentin which has taken his pain from 9/10 to 1/10, but is making him very tired. 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 Pt reports increase in pain with bending down to pick things up off floor. Pt reports leaning towards LLE when back pain increases which is now causing his L knee to hurt. Pt denies falls. Pt denies saddle anesthesia, denies loss of b/b control. Pt reports numbness/tingling in posterior R thigh sometimes. Pt reports increase pain with turning to L  side in bed. Pt reports current 1/10 pain.  Pt reports 9/10 pain without gabapentin. Pt reports helping his son move out from his apartment a few months ago and that's when the pain got even worse. Pt reports he has lost 40 pounds and is trying to lose more weight.    Pertinent History  HTN, DM. anxiety; R wrist fracture surgery    Limitations  Standing;Lifting    How long can you sit comfortably?  no issues    How long can you stand comfortably?  1 hour    How long can you walk comfortably?  1 hour    Diagnostic tests  MRI 3/25: L2-L3, L3-L4,L4-L5 disc protrusion with possible nerve roots affected, L L5  foraminal stenosis, L2-3 retrolisthesis    Patient Stated Goals  pain-free, be able to work    Currently in Pain?  Yes    Pain Score  1     Pain Location  Back    Pain Orientation  Lower    Pain Descriptors / Indicators  Aching;Burning    Pain Type  Chronic pain    Pain Onset  More than a month ago    Pain Frequency  Constant    Aggravating Factors   standing, walking, bending to lift things from floor    Pain Relieving Factors  gabapentin    Effect of Pain on Daily Activities  limited         [facility] PT Assessment - 12/01/19 0001      Assessment   Medical Diagnosis  DDD, lumbar radiculopathy    Referring Provider (PT)  Fenton Foy, PA    Onset Date/Surgical Date  --   approximately 2 years ago   Next MD Visit  none scheduled    Prior Therapy  none      Precautions   Precautions  None      Restrictions   Weight Bearing Restrictions  No      Balance Screen   Has the patient fallen in the past 6 months  No   slipped in grass during photography session, no injury   Has the patient had a decrease in activity level because of a fear of falling?   No    Is the patient reluctant to leave their home because of a fear of falling?   No      Prior Function   Level of Independence  Independent    Vocation  Full time employment    Research scientist (medical)- standing, walking, editing photos    Leisure  play with 52aughter      Cognition   Overall Cognitive Status  Within Functional Limits for tasks assessed      Observation/Other Assessments   Focus on Therapeutic Outcomes (FOTO)   40% limited      Sensation   Light Touch  Appears Intact      Coordination   Heel Shin Test  no deficits bilaterally      Functional Tests   Functional tests  Sit to Stand      Sit to Stand   Comments  5x STS: 16 sec, from chair with BUE assisting, 1/10 pain      Posture/Postural Control   Posture/Postural Control  Postural limitations    Postural Limitations  Rounded  Shoulders;Forward head    Posture Comments  overall flattened spinal curve      ROM / Strength   AROM / PROM / Strength  AROM;Strength  Strength   Strength Assessment Site  Hip;Knee;Ankle    Right Hip Flexion  3+/5   painful   Right Hip Extension  3+/5   painful   Right Hip ABduction  3+/5   painful   Left Hip Flexion  5/5    Left Hip Extension  3+/5    Left Hip ABduction  5/5    Right Knee Flexion  5/5    Right Knee Extension  5/5    Left Knee Flexion  5/5    Left Knee Extension  5/5    Right Ankle Dorsiflexion  5/5    Left Ankle Dorsiflexion  5/5      Palpation   Spinal mobility  tenderness at L 2-5 with gentle PAs, but not normally painful in that area    SI assessment   denies pain with palpation    Palpation comment  tenderness at R piriformis and glute mm causing pain to radiate to R posterior knee; denies pain/tenderness at bil lumbar paraspinals, L piriformis and glute mm      Special Tests    Special Tests  Lumbar    Lumbar Tests  Slump Test      Slump test   Findings  Positive    Side  Right      Ambulation/Gait   Ambulation/Gait  Yes    Ambulation/Gait Assistance  7: Independent    Assistive device  None    Gait Pattern  Within Functional Limits    Gait Comments  observed pt ambulate into/out of clinic without deficits noted, no AD      Balance   Balance Assessed  Yes      Static Standing Balance   Static Standing - Balance Support  No upper extremity supported    Static Standing Balance -  Activities   Single Leg Stance - Right Leg;Single Leg Stance - Left Leg    Static Standing - Comment/# of Minutes  R: 30+ sec, L: 30+ sec          Objective measurements completed on examination: See above findings.       PT Education - 12/01/19 1356    Education Details  Eval findings, FOTO findings, initiated HEP, general anatomy of low back nerves vs sciatica nerve    Person(s) Educated  Patient    Methods  Explanation;Demonstration;Handout     Comprehension  Verbalized understanding;Returned demonstration       PT Short Term Goals - 12/01/19 1453      PT SHORT TERM GOAL #1   Title  Pt will perform HEP correctly and daily to improve tolerance to activity and improve overall QoL.    Time  3    Period  Weeks    Status  New    Target Date  12/22/19        PT Long Term Goals - 12/01/19 1454      PT LONG TERM GOAL #1   Title  Pt will improve FOTO score to 25% limited to indicate significant improvement in self-perceived limitations, thus improving overall QoL.    Time  6    Period  Weeks    Status  New    Target Date  01/12/20      PT LONG TERM GOAL #2   Title  Pt will self report ability to stand for 4 hours with 2 seated rest breaks with 4/10 back pain at worst to complete birthday party/wedding event.    Time  6  Period  Weeks    Status  New      PT LONG TERM GOAL #3   Title  Pt will demonstrate RLE strength pain-free and equal to LLE to improve standing and ambulation tolerance needed for occupation.    Time  6    Period  Weeks    Status  New      PT LONG TERM GOAL #4   Title  Pt will have pain-free lumbar AROM to allow him to complete household chores, rising from seated position and play with 6YO daughter.    Time  6    Period  Weeks    Status  New             Plan - 12/01/19 1445    Clinical Impression Statement  Pt is a pleasant 54YO male with LBP radiating down posterior RLE. Pt demonstrates increased pain with MMT, AROM, and functional tests. Pt with positive R slump test. Difficult to assess recreating typical pain vs. pain from therapist palpating so will continue to assess triggers and muscle tightness. PT is very active and motivated to improve and decrease medication needs. Pt would benefit from skilled PT interventions to reduce pain with daily activity, improve strength establish HEP and improve overall QoL.    Personal Factors and Comorbidities  Comorbidity 1    Comorbidities  HTN     Examination-Activity Limitations  Bend;Lift;Locomotion Level;Stand    Examination-Participation Restrictions  Cleaning;Laundry    Stability/Clinical Decision Making  Stable/Uncomplicated    Clinical Decision Making  Low    Rehab Potential  Good    PT Frequency  1x / week    PT Duration  6 weeks    PT Treatment/Interventions  ADLs/Self Care Home Management;Aquatic Therapy;Biofeedback;Cryotherapy;Moist Heat;DME Instruction;Gait training;Stair training;Functional mobility training;Therapeutic activities;Therapeutic exercise;Balance training;Neuromuscular re-education;Patient/family education;Manual techniques;Passive range of motion;Dry needling;Joint Manipulations    PT Next Visit Plan  Review goals, HEP. Add HEP exercises weekly due to 1x/wk. Begin core strengthening (isometric ab set), glute strengthening (bridges, clams, SL abduction), posture strengthening (scap squeezes, rows) and lumbar AROM exercises. Stretching for lumbar/hips. Use manual for pain relief.    PT Home Exercise Plan  Eval: seated piriformis stretch, seated HS stretch    Consulted and Agree with Plan of Care  Patient       Patient will benefit from skilled therapeutic intervention in order to improve the following deficits and impairments:  Decreased activity tolerance, Decreased range of motion, Decreased strength, Hypomobility, Increased muscle spasms, Impaired perceived functional ability, Postural dysfunction, Pain  Visit Diagnosis: Chronic right-sided low back pain, unspecified whether sciatica present  Other symptoms and signs involving the musculoskeletal system  Other abnormalities of gait and mobility     Problem List Patient Active Problem List   Diagnosis Date Noted  . Palpitations 02/21/2016     Talbot Grumbling PT, DPT 12/01/19, 3:03 PM [place] [address] [place], [place], [address] Phone: [phone]   Fax:  [phone]  Name:  Jermichael [place]ville MRN: [id number] Date of Birth: 1965-11-02

## 2019-12-08 ENCOUNTER — Ambulatory Visit (HOSPITAL_COMMUNITY): Payer: 59 | Admitting: Physical Therapy

## 2019-12-08 ENCOUNTER — Telehealth (HOSPITAL_COMMUNITY): Payer: Self-pay | Admitting: Physical Therapy

## 2019-12-08 NOTE — Telephone Encounter (Signed)
pt cancelled appt for today, he said something came up

## 2019-12-15 ENCOUNTER — Telehealth (HOSPITAL_COMMUNITY): Payer: Self-pay | Admitting: Physical Therapy

## 2019-12-15 ENCOUNTER — Ambulatory Visit (HOSPITAL_COMMUNITY): Payer: 59 | Attending: Physician Assistant | Admitting: Physical Therapy

## 2019-12-15 ENCOUNTER — Encounter (HOSPITAL_COMMUNITY): Payer: Self-pay | Admitting: Physical Therapy

## 2019-12-15 NOTE — Telephone Encounter (Signed)
Patient not present for today's appointment. Called patient who stated he cancelled on the auto appointment reminder yesterday. He states he also wishes to cancel remaining appointments because he is seeing a chiropractor and is feeling better.   4:17 PM, 12/15/19 Mearl Latin PT, DPT Physical Therapist at [facility]

## 2019-12-15 NOTE — Therapy (Unsigned)
[place], [place], [address] Phone: [phone]   Fax:  [phone]  Patient Details  Name: Scott [name] MRN: [id number] Date of Birth: 06/10/1966 Referring Provider:  No ref. provider found  Encounter Date: 12/15/2019  PHYSICAL THERAPY DISCHARGE SUMMARY  Visits from Start of Care: 1  Current functional level related to goals / functional outcomes: Patient has not met any goals as he has only been evaluated and wishes to be discharged from therapy at this time.   Remaining deficits: Patient states he has been seen by chiropractor and it has been helping.   Education / Equipment: Eval findings, FOTO findings, initiated HEP, general anatomy of low back nerves vs sciatica nerve   Plan: Patient agrees to discharge.  Patient goals were not met. Patient is being discharged due to not returning since the last visit.  ?????      4:20 PM, 12/15/19 Mearl Latin PT, DPT Physical Therapist at [place], [place], [address] Phone: [phone]   Fax:  [phone]

## 2019-12-22 ENCOUNTER — Encounter (HOSPITAL_COMMUNITY): Payer: 59 | Admitting: Physical Therapy

## 2019-12-29 ENCOUNTER — Encounter (HOSPITAL_COMMUNITY): Payer: 59 | Admitting: Physical Therapy

## 2020-01-05 ENCOUNTER — Encounter (HOSPITAL_COMMUNITY): Payer: 59 | Admitting: Physical Therapy

## 2020-01-12 ENCOUNTER — Encounter (HOSPITAL_COMMUNITY): Payer: 59 | Admitting: Physical Therapy

## 2020-04-06 ENCOUNTER — Ambulatory Visit
Admission: EM | Admit: 2020-04-06 | Discharge: 2020-04-06 | Disposition: A | Discharge status: Home / Self Care | Payer: 59 | Attending: Emergency Medicine | Admitting: Emergency Medicine

## 2020-04-06 ENCOUNTER — Other Ambulatory Visit: Payer: Self-pay

## 2020-04-06 DIAGNOSIS — Z1152 Encounter for screening for COVID-19: Secondary | ICD-10-CM | POA: Diagnosis not present

## 2020-04-06 DIAGNOSIS — J069 Acute upper respiratory infection, unspecified: Secondary | ICD-10-CM | POA: Diagnosis not present

## 2020-04-06 MED ORDER — CETIRIZINE HCL 10 MG PO TABS: 10.0000 mg | ORAL_TABLET | Freq: Every day | ORAL | 0 refills | Status: DC

## 2020-04-06 MED ORDER — BENZONATATE 100 MG PO CAPS: 100.0000 mg | ORAL_CAPSULE | Freq: Three times a day (TID) | ORAL | 0 refills | Status: DC

## 2020-04-06 MED ORDER — PREDNISONE 10 MG PO TABS: 20.0000 mg | ORAL_TABLET | Freq: Every day | ORAL | 0 refills | Status: DC

## 2020-04-06 MED ORDER — FLUTICASONE PROPIONATE 50 MCG/ACT NA SUSP: 1.0000 | Freq: Every day | NASAL | 0 refills | Status: DC

## 2020-04-06 NOTE — Discharge Instructions (Signed)
COVID testing ordered.  It will take between 2-7 days for test results.  Someone will contact you regarding abnormal results.    In the meantime: You should remain isolated in your home for 10 days from symptom onset AND greater than 24 hours after symptoms resolution (absence of fever without the use of fever-reducing medication and improvement in respiratory symptoms), whichever is longer Get plenty of rest and push fluids Tessalon Perles prescribed for cough zyrtec for nasal congestion, runny nose, and/or sore throat  flonase for nasal congestion and runny nose Low-dose prednisone was prescribed Use medications daily for symptom relief Use OTC medications like ibuprofen or tylenol as needed fever or pain Call or go to the ED if you have any new or worsening symptoms such as fever, worsening cough, shortness of breath, chest tightness, chest pain, turning blue, changes in mental status, etc..Marland Kitchen

## 2020-04-06 NOTE — ED Provider Notes (Addendum)
[place]   [id number] 04/06/20 Arrival Time: 0818   CC: COVID symptoms  SUBJECTIVE: History from: patient and family.  Scott [name] is a 54 y.o. male who presents for COVID testing and cough and congestion.  Denies sick exposure to COVID, flu or strep.  Denies recent travel.  Denies aggravating or alleviating symptoms.  Denies previous COVID infection.   Denies fever, chills, fatigue, rhinorrhea, sore throat, SOB, wheezing, chest pain, nausea, vomiting, changes in bowel or bladder habits.      ROS: As per HPI.  All other pertinent ROS negative.     Past Medical History:  Diagnosis Date  . Anxiety   . Back pain    pinched nerve sciatica  . Diabetes mellitus without complication ([place])    type  2  . Elevated cholesterol with elevated triglycerides   . Elevated liver enzymes 2014   normal now  . GERD (gastroesophageal reflux disease)   . Hypertension   . Hypertension   . Sleep apnea    uses cpap,setting of 14   Past Surgical History:  Procedure Laterality Date  . COLONOSCOPY N/A 09/29/2012   Procedure: COLONOSCOPY;  Surgeon: Juanita Craver, MD;  Location: [facility];  Service: Endoscopy;  Laterality: N/A;  . COLONOSCOPY WITH PROPOFOL N/A 06/22/2016   Procedure: COLONOSCOPY WITH PROPOFOL;  Surgeon: Carol Ada, MD;  Location: [facility];  Service: Endoscopy;  Laterality: N/A;  . ESOPHAGOGASTRODUODENOSCOPY N/A 09/29/2012   Procedure: ESOPHAGOGASTRODUODENOSCOPY (EGD);  Surgeon: Juanita Craver, MD;  Location: [facility];  Service: Endoscopy;  Laterality: N/A;  . ESOPHAGOGASTRODUODENOSCOPY (EGD) WITH PROPOFOL N/A 06/22/2016   Procedure: ESOPHAGOGASTRODUODENOSCOPY (EGD) WITH PROPOFOL;  Surgeon: Carol Ada, MD;  Location: [facility];  Service: Endoscopy;  Laterality: N/A;  . FRACTURE SURGERY     right wrist with screws  . HERNIA REPAIR  2008   inguinal   Allergies  Allergen Reactions  . Metformin And Related Diarrhea  . Trulicity [Dulaglutide] Other (See  Comments)    Weakness - severe   No current facility-administered medications on file prior to encounter.   Current Outpatient Medications on File Prior to Encounter  Medication Sig Dispense Refill  . atenolol-chlorthalidone (TENORETIC) 100-25 MG per tablet Take 1 tablet by mouth daily.    Marland Kitchen atorvastatin (LIPITOR) 20 MG tablet Take 20 mg by mouth every evening.     . benzocaine (AMERICAINE) 20 % rectal ointment Place rectally every 3 (three) hours as needed for pain. 28.4 g 0  . busPIRone (BUSPAR) 10 MG tablet Take 1 tablet by mouth 2 (two) times daily.  0  . Canagliflozin-Metformin HCl ER (INVOKAMET XR) 50-500 MG TB24 Take 2 tablets by mouth daily before breakfast. 60 tablet 3  . docusate sodium (COLACE) 100 MG capsule Take 1 capsule (100 mg total) by mouth every 12 (twelve) hours. 30 capsule 0  . fish oil-omega-3 fatty acids 1000 MG capsule Take 2 g by mouth every evening.     . gabapentin (NEURONTIN) 300 MG capsule Take 300 mg by mouth 3 (three) times daily.    Marland Kitchen glucose blood test strip Use to check blood sugar once a day. 100 each 12  . hydrocortisone (ANUSOL-HC) 2.5 % rectal cream Place 1 application rectally 2 (two) times daily.   0  . hydrOXYzine (ATARAX/VISTARIL) 25 MG tablet Take 1 tablet by mouth at bedtime as needed (sleep).   0  . lisinopril (PRINIVIL,ZESTRIL) 2.5 MG tablet Take 2.5 mg by mouth every evening.     Marland Kitchen LORazepam (ATIVAN) 0.5  MG tablet Take 0.5 mg by mouth every 8 (eight) hours as needed for anxiety.     Marland Kitchen MICROLET LANCETS MISC Use to check blood sugar. 100 each 2  . omeprazole (PRILOSEC) 20 MG capsule Take 20 mg by mouth daily.    . RYBELSUS 3 MG TABS Take 1 tablet by mouth every morning.    . [DISCONTINUED] liraglutide (VICTOZA) 18 MG/3ML SOPN Inject 1.2 mg into the skin every evening.     Social History   Socioeconomic History  . Marital status: Married    Spouse name: Not on file  . Number of children: Not on file  . Years of education: Not on file  .  Highest education level: Not on file  Occupational History  . Not on file  Tobacco Use  . Smoking status: Never Smoker  . Smokeless tobacco: Never Used  Substance and Sexual Activity  . Alcohol use: Yes    Comment: rarely  . Drug use: Yes    Types: Marijuana    Comment: rare marijuana use only last used 3 months ago  . Sexual activity: Yes  Other Topics Concern  . Not on file  Social History Narrative  . Not on file   Social Determinants of Health   Financial Resource Strain:   . Difficulty of Paying Living Expenses: Not on file  Food Insecurity:   . Worried About Charity fundraiser in the Last Year: Not on file  . Ran Out of Food in the Last Year: Not on file  Transportation Needs:   . Lack of Transportation (Medical): Not on file  . Lack of Transportation (Non-Medical): Not on file  Physical Activity:   . Days of Exercise per Week: Not on file  . Minutes of Exercise per Session: Not on file  Stress:   . Feeling of Stress : Not on file  Social Connections:   . Frequency of Communication with Friends and Family: Not on file  . Frequency of Social Gatherings with Friends and Family: Not on file  . Attends Religious Services: Not on file  . Active Member of Clubs or Organizations: Not on file  . Attends Archivist Meetings: Not on file  . Marital Status: Not on file  Intimate Partner Violence:   . Fear of Current or Ex-Partner: Not on file  . Emotionally Abused: Not on file  . Physically Abused: Not on file  . Sexually Abused: Not on file   Family History  Problem Relation Age of Onset  . Diabetes Mother   . Hypertension Mother   . Hyperlipidemia Mother   . Cancer Father   . Thyroid disease Sister     OBJECTIVE:  Vitals:   04/06/20 0831  BP: 108/71  Pulse: 69  Resp: 20  Temp: 98.4 F (36.9 C)  SpO2: 95%     General appearance: alert; appears fatigued, but nontoxic; speaking in full sentences and tolerating own secretions HEENT: NCAT; Ears:  EACs clear, TMs pearly gray; Eyes: PERRL.  EOM grossly intact. Sinuses: nontender; Nose: nares patent without rhinorrhea, Throat: oropharynx clear, tonsils non erythematous or enlarged, uvula midline  Neck: supple without LAD Lungs: unlabored respirations, symmetrical air entry; cough: moderate; no respiratory distress; CTAB Heart: regular rate and rhythm.  Radial pulses 2+ symmetrical bilaterally Skin: warm and dry Psychological: alert and cooperative; normal mood and affect  LABS:  No results found for this or any previous visit (from the past 24 hour(s)).   ASSESSMENT & PLAN:  1. Viral URI   2. Encounter for screening for COVID-19     Meds ordered this encounter  Medications  . cetirizine (ZYRTEC ALLERGY) 10 MG tablet    Sig: Take 1 tablet (10 mg total) by mouth daily.    Dispense:  30 tablet    Refill:  0  . fluticasone (FLONASE) 50 MCG/ACT nasal spray    Sig: Place 1 spray into both nostrils daily for 14 days.    Dispense:  16 g    Refill:  0  . benzonatate (TESSALON) 100 MG capsule    Sig: Take 1 capsule (100 mg total) by mouth every 8 (eight) hours.    Dispense:  30 capsule    Refill:  0  . DISCONTD: predniSONE (DELTASONE) 10 MG tablet    Sig: Take 2 tablets (20 mg total) by mouth daily.    Dispense:  15 tablet    Refill:  0    Discharge Instructions   COVID testing ordered.  It will take between 2-7 days for test results.  Someone will contact you regarding abnormal results.    In the meantime: You should remain isolated in your home for 10 days from symptom onset AND greater than 24 hours after symptoms resolution (absence of fever without the use of fever-reducing medication and improvement in respiratory symptoms), whichever is longer Get plenty of rest and push fluids Tessalon Perles prescribed for cough zyrtec for nasal congestion, runny nose, and/or sore throat  flonase for nasal congestion and runny nose Low-dose prednisone was prescribed Use medications  daily for symptom relief Use OTC medications like ibuprofen or tylenol as needed fever or pain Call or go to the ED if you have any new or worsening symptoms such as fever, worsening cough, shortness of breath, chest tightness, chest pain, turning blue, changes in mental status, etc...   Reviewed expectations re: course of current medical issues. Questions answered. Outlined signs and symptoms indicating need for more acute intervention. Patient verbalized understanding. After Visit Summary given.         Emerson Monte, [place] 04/06/20 [place], [place], FNP 04/06/20 [phone]

## 2020-04-08 LAB — NOVEL CORONAVIRUS, NAA: SARS-CoV-2, NAA: NOT DETECTED

## 2020-04-14 ENCOUNTER — Ambulatory Visit
Admission: EM | Admit: 2020-04-14 | Discharge: 2020-04-14 | Disposition: A | Discharge status: Home / Self Care | Payer: 59 | Attending: Emergency Medicine | Admitting: Emergency Medicine

## 2020-04-14 ENCOUNTER — Other Ambulatory Visit: Payer: Self-pay

## 2020-04-14 DIAGNOSIS — J069 Acute upper respiratory infection, unspecified: Secondary | ICD-10-CM | POA: Diagnosis not present

## 2020-04-14 DIAGNOSIS — Z1152 Encounter for screening for COVID-19: Secondary | ICD-10-CM

## 2020-04-14 MED ORDER — IBUPROFEN 800 MG PO TABS: 800.0000 mg | ORAL_TABLET | Freq: Three times a day (TID) | ORAL | 0 refills | Status: DC

## 2020-04-14 MED ORDER — DM-GUAIFENESIN ER 30-600 MG PO TB12: 1.0000 | ORAL_TABLET | Freq: Two times a day (BID) | ORAL | 0 refills | Status: DC

## 2020-04-14 NOTE — Discharge Instructions (Addendum)
COVID pending Mucinex DM twice daily for congestion/cough Use anti-inflammatories for aches. You may take up to 800 mg Ibuprofen every 8 hours with food. You may supplement Ibuprofen with Tylenol [phone] mg every 8 hours.  Rest and fluids Follow up if not improving or worsening

## 2020-04-14 NOTE — ED Triage Notes (Signed)
Pt tested negative last week , patients family is positive, pt states he is feeling worse for past couple

## 2020-04-14 NOTE — ED Provider Notes (Signed)
[facility]    CSN: [id number] Arrival date & time: 04/14/20  0957      History   Chief Complaint Chief Complaint  Patient presents with  . Generalized Body Aches    HPI Scott [name] is a 54 y.o. male history of DM type II presenting today for evaluation of body aches and URI symptoms.  Patient woke up this morning feeling fatigued and achy.  He has had some congestion, rhinorrhea with a very mild cough.  Denies chest pain or shortness of breath.  Denies sore throat.  Denies any known fevers.  Reports wife and daughter positive at home, he had negative test last week.  Does report some diarrhea yesterday, but no nausea or vomiting.  HPI  Past Medical History:  Diagnosis Date  . Anxiety   . Back pain    pinched nerve sciatica  . Diabetes mellitus without complication ([place])    type  2  . Elevated cholesterol with elevated triglycerides   . Elevated liver enzymes 2014   normal now  . GERD (gastroesophageal reflux disease)   . Hypertension   . Hypertension   . Sleep apnea    uses cpap,setting of 14    Patient Active Problem List   Diagnosis Date Noted  . Palpitations 02/21/2016    Past Surgical History:  Procedure Laterality Date  . COLONOSCOPY N/A 09/29/2012   Procedure: COLONOSCOPY;  Surgeon: Juanita Craver, MD;  Location: [facility];  Service: Endoscopy;  Laterality: N/A;  . COLONOSCOPY WITH PROPOFOL N/A 06/22/2016   Procedure: COLONOSCOPY WITH PROPOFOL;  Surgeon: Carol Ada, MD;  Location: [facility];  Service: Endoscopy;  Laterality: N/A;  . ESOPHAGOGASTRODUODENOSCOPY N/A 09/29/2012   Procedure: ESOPHAGOGASTRODUODENOSCOPY (EGD);  Surgeon: Juanita Craver, MD;  Location: [facility];  Service: Endoscopy;  Laterality: N/A;  . ESOPHAGOGASTRODUODENOSCOPY (EGD) WITH PROPOFOL N/A 06/22/2016   Procedure: ESOPHAGOGASTRODUODENOSCOPY (EGD) WITH PROPOFOL;  Surgeon: Carol Ada, MD;  Location: [facility];  Service: Endoscopy;  Laterality: N/A;  . FRACTURE  SURGERY     right wrist with screws  . HERNIA REPAIR  2008   inguinal       Home Medications    Prior to Admission medications   Medication Sig Start Date End Date Taking? Authorizing Provider  atenolol-chlorthalidone (TENORETIC) 100-25 MG per tablet Take 1 tablet by mouth daily.    [provider]  atorvastatin (LIPITOR) 20 MG tablet Take 20 mg by mouth every evening.     [provider]  benzocaine (AMERICAINE) 20 % rectal ointment Place rectally every 3 (three) hours as needed for pain. 01/07/18   Noemi Chapel, MD  benzonatate (TESSALON) 100 MG capsule Take 1 capsule (100 mg total) by mouth every 8 (eight) hours. 04/06/20   Avegno, Darrelyn Hillock, FNP  busPIRone (BUSPAR) 10 MG tablet Take 1 tablet by mouth 2 (two) times daily. 07/07/15   [provider]  Canagliflozin-Metformin HCl ER (INVOKAMET XR) 50-500 MG TB24 Take 2 tablets by mouth daily before breakfast. 07/02/16   Philemon Kingdom, MD  cetirizine (ZYRTEC ALLERGY) 10 MG tablet Take 1 tablet (10 mg total) by mouth daily. 04/06/20   Avegno, Darrelyn Hillock, FNP  dextromethorphan-guaiFENesin (MUCINEX DM) 30-600 MG 12hr tablet Take 1 tablet by mouth 2 (two) times daily. 04/14/20   Charmain Diosdado C, PA-C  docusate sodium (COLACE) 100 MG capsule Take 1 capsule (100 mg total) by mouth every 12 (twelve) hours. 01/07/18   Noemi Chapel, MD  fish oil-omega-3 fatty acids 1000 MG capsule Take 2  g by mouth every evening.     [provider]  fluticasone (FLONASE) 50 MCG/ACT nasal spray Place 1 spray into both nostrils daily for 14 days. 04/06/20 04/20/20  Avegno, Darrelyn Hillock, FNP  gabapentin (NEURONTIN) 300 MG capsule Take 300 mg by mouth 3 (three) times daily.    [provider]  glucose blood test strip Use to check blood sugar once a day. 07/02/16   Philemon Kingdom, MD  hydrocortisone (ANUSOL-HC) 2.5 % rectal cream Place 1 application rectally 2 (two) times daily.  06/09/16   [provider]  hydrOXYzine  (ATARAX/VISTARIL) 25 MG tablet Take 1 tablet by mouth at bedtime as needed (sleep).  07/04/15   [provider]  ibuprofen (ADVIL) 800 MG tablet Take 1 tablet (800 mg total) by mouth 3 (three) times daily. 04/14/20   Duwan Adrian C, PA-C  lisinopril (PRINIVIL,ZESTRIL) 2.5 MG tablet Take 2.5 mg by mouth every evening.     [provider]  LORazepam (ATIVAN) 0.5 MG tablet Take 0.5 mg by mouth every 8 (eight) hours as needed for anxiety.     [provider]  MICROLET LANCETS MISC Use to check blood sugar. 07/02/16   Philemon Kingdom, MD  omeprazole (PRILOSEC) 20 MG capsule Take 20 mg by mouth daily.    [provider]  RYBELSUS 3 MG TABS Take 1 tablet by mouth every morning. 11/01/19   [provider]  liraglutide (VICTOZA) 18 MG/3ML SOPN Inject 1.2 mg into the skin every evening.  11/02/19  [provider]    Family History Family History  Problem Relation Age of Onset  . Diabetes Mother   . Hypertension Mother   . Hyperlipidemia Mother   . Cancer Father   . Thyroid disease Sister     Social History Social History   Tobacco Use  . Smoking status: Never Smoker  . Smokeless tobacco: Never Used  Substance Use Topics  . Alcohol use: Yes    Comment: rarely  . Drug use: Yes    Types: Marijuana    Comment: rare marijuana use only last used 3 months ago     Allergies   Metformin and related and Trulicity [dulaglutide]   Review of Systems Review of Systems  Constitutional: Positive for fatigue. Negative for activity change, appetite change, chills and fever.  HENT: Positive for congestion and rhinorrhea. Negative for ear pain, sinus pressure, sore throat and trouble swallowing.   Eyes: Negative for discharge and redness.  Respiratory: Positive for cough. Negative for chest tightness and shortness of breath.   Cardiovascular: Negative for chest pain.  Gastrointestinal: Positive for diarrhea. Negative for abdominal pain, nausea  and vomiting.  Musculoskeletal: Positive for myalgias.  Skin: Negative for rash.  Neurological: Positive for headaches. Negative for dizziness and light-headedness.     Physical Exam Triage Vital Signs ED Triage Vitals  Enc Vitals Group     BP 04/14/20 1119 122/79     Pulse Rate 04/14/20 1119 66     Resp 04/14/20 1119 18     Temp 04/14/20 1119 98.8 F (37.1 C)     Temp src --      SpO2 04/14/20 1119 96 %     Weight --      Height --      Head Circumference --      Peak Flow --      Pain Score 04/14/20 1117 6     Pain Loc --      Pain Edu? --  Excl. in GC? --    No data found.  Updated Vital Signs BP 122/79   Pulse 66   Temp 98.8 F (37.1 C)   Resp 18   SpO2 96%   Visual Acuity Right Eye Distance:   Left Eye Distance:   Bilateral Distance:    Right Eye Near:   Left Eye Near:    Bilateral Near:     Physical Exam Vitals and nursing note reviewed.  Constitutional:      Appearance: He is well-developed.     Comments: No acute distress  HENT:     Head: Normocephalic and atraumatic.     Ears:     Comments: Bilateral ears without tenderness to palpation of external auricle, tragus and mastoid, EAC's without erythema or swelling, TM's with good bony landmarks and cone of light. Non erythematous.     Nose: Nose normal.     Mouth/Throat:     Comments: Oral mucosa pink and moist, no tonsillar enlargement or exudate. Posterior pharynx patent and nonerythematous, no uvula deviation or swelling. Normal phonation. Eyes:     Conjunctiva/sclera: Conjunctivae normal.  Cardiovascular:     Rate and Rhythm: Normal rate.  Pulmonary:     Effort: Pulmonary effort is normal. No respiratory distress.     Comments: Breathing comfortably at rest, CTABL, no wheezing, rales or other adventitious sounds auscultated Abdominal:     General: There is no distension.  Musculoskeletal:        General: Normal range of motion.     Cervical back: Neck supple.  Skin:    General:  Skin is warm and dry.  Neurological:     Mental Status: He is alert and oriented to person, place, and time.      UC Treatments / Results  Labs (all labs ordered are listed, but only abnormal results are displayed) Labs Reviewed  NOVEL CORONAVIRUS, NAA    EKG   Radiology No results found.  Procedures Procedures (including critical care time)  Medications Ordered in UC Medications - No data to display  Initial Impression / Assessment and Plan / UC Course  I have reviewed the triage vital signs and the nursing notes.  Pertinent labs & imaging results that were available during my care of the patient were reviewed by me and considered in my medical decision making (see chart for details).     Covid PCR pending to repeat.  Suspicious of Covid given positive at home.  Recommending symptomatic and supportive care rest and fluids.  Day 1 of symptoms, vital signs stable.  Lungs clear.  Discussed strict return precautions. Patient verbalized understanding and is agreeable with plan.  Final Clinical Impressions(s) / UC Diagnoses   Final diagnoses:  Viral URI with cough     Discharge Instructions     COVID pending Mucinex DM twice daily for congestion/cough Use anti-inflammatories for aches. You may take up to 800 mg Ibuprofen every 8 hours with food. You may supplement Ibuprofen with Tylenol [phone] mg every 8 hours.  Rest and fluids Follow up if not improving or worsening     ED Prescriptions    Medication Sig Dispense Auth. Provider   dextromethorphan-guaiFENesin (MUCINEX DM) 30-600 MG 12hr tablet Take 1 tablet by mouth 2 (two) times daily. 20 tablet Carmel Garfield C, PA-C   ibuprofen (ADVIL) 800 MG tablet Take 1 tablet (800 mg total) by mouth 3 (three) times daily. 30 tablet Kaden Dunkel, [place] C, PA-C     PDMP not reviewed this encounter.  Janith Lima, PA-C 04/14/20 1142

## 2020-04-16 LAB — SARS-COV-2, NAA 2 DAY TAT

## 2020-04-16 LAB — NOVEL CORONAVIRUS, NAA: SARS-CoV-2, NAA: NOT DETECTED

## 2020-05-09 ENCOUNTER — Other Ambulatory Visit: Payer: Self-pay | Admitting: Gastroenterology

## 2020-05-17 ENCOUNTER — Other Ambulatory Visit (HOSPITAL_COMMUNITY)
Admission: RE | Admit: 2020-05-17 | Discharge: 2020-05-17 | Disposition: A | Discharge status: Home / Self Care | Payer: 59 | Source: Ambulatory Visit | Attending: Gastroenterology | Admitting: Gastroenterology

## 2020-05-17 DIAGNOSIS — Z20822 Contact with and (suspected) exposure to covid-19: Secondary | ICD-10-CM | POA: Diagnosis not present

## 2020-05-17 DIAGNOSIS — Z01812 Encounter for preprocedural laboratory examination: Secondary | ICD-10-CM | POA: Insufficient documentation

## 2020-05-17 LAB — SARS CORONAVIRUS 2 (TAT 6-24 HRS): SARS Coronavirus 2: NEGATIVE

## 2020-05-20 ENCOUNTER — Other Ambulatory Visit: Payer: Self-pay

## 2020-05-20 ENCOUNTER — Encounter (HOSPITAL_COMMUNITY): Payer: Self-pay | Admitting: Gastroenterology

## 2020-05-20 ENCOUNTER — Encounter (HOSPITAL_COMMUNITY)
Admission: RE | Disposition: A | Discharge status: Home / Self Care | Payer: Self-pay | Source: Home / Self Care | Attending: Gastroenterology

## 2020-05-20 ENCOUNTER — Ambulatory Visit (HOSPITAL_COMMUNITY): Payer: 59 | Admitting: Anesthesiology

## 2020-05-20 ENCOUNTER — Ambulatory Visit (HOSPITAL_COMMUNITY)
Admission: RE | Admit: 2020-05-20 | Discharge: 2020-05-20 | Disposition: A | Discharge status: Home / Self Care | Payer: 59 | Attending: Gastroenterology | Admitting: Gastroenterology

## 2020-05-20 DIAGNOSIS — G4730 Sleep apnea, unspecified: Secondary | ICD-10-CM | POA: Diagnosis not present

## 2020-05-20 DIAGNOSIS — Z833 Family history of diabetes mellitus: Secondary | ICD-10-CM | POA: Insufficient documentation

## 2020-05-20 DIAGNOSIS — D509 Iron deficiency anemia, unspecified: Secondary | ICD-10-CM | POA: Insufficient documentation

## 2020-05-20 DIAGNOSIS — K5730 Diverticulosis of large intestine without perforation or abscess without bleeding: Secondary | ICD-10-CM | POA: Insufficient documentation

## 2020-05-20 DIAGNOSIS — Z6839 Body mass index (BMI) 39.0-39.9, adult: Secondary | ICD-10-CM | POA: Diagnosis not present

## 2020-05-20 DIAGNOSIS — I10 Essential (primary) hypertension: Secondary | ICD-10-CM | POA: Diagnosis not present

## 2020-05-20 DIAGNOSIS — Z8249 Family history of ischemic heart disease and other diseases of the circulatory system: Secondary | ICD-10-CM | POA: Diagnosis not present

## 2020-05-20 DIAGNOSIS — E119 Type 2 diabetes mellitus without complications: Secondary | ICD-10-CM | POA: Diagnosis not present

## 2020-05-20 DIAGNOSIS — K649 Unspecified hemorrhoids: Secondary | ICD-10-CM | POA: Insufficient documentation

## 2020-05-20 DIAGNOSIS — K921 Melena: Secondary | ICD-10-CM | POA: Diagnosis not present

## 2020-05-20 HISTORY — PX: COLONOSCOPY WITH PROPOFOL: SHX5780

## 2020-05-20 HISTORY — PX: ENTEROSCOPY: SHX5533

## 2020-05-20 LAB — GLUCOSE, CAPILLARY: Glucose-Capillary: 114 mg/dL — ABNORMAL HIGH (ref 70–99)

## 2020-05-20 SURGERY — COLONOSCOPY WITH PROPOFOL
Anesthesia: Monitor Anesthesia Care

## 2020-05-20 MED ORDER — LIDOCAINE 2% (20 MG/ML) 5 ML SYRINGE
INTRAMUSCULAR | Status: DC | PRN
  Administered 2020-05-20: 100 mg via INTRAVENOUS

## 2020-05-20 MED ORDER — LACTATED RINGERS IV SOLN: INTRAVENOUS | Status: DC | PRN

## 2020-05-20 MED ORDER — PROPOFOL 10 MG/ML IV BOLUS
INTRAVENOUS | Status: AC
  Filled 2020-05-20: qty 40

## 2020-05-20 MED ORDER — PROPOFOL 500 MG/50ML IV EMUL
INTRAVENOUS | Status: DC | PRN
  Administered 2020-05-20: 200 ug/kg/min via INTRAVENOUS

## 2020-05-20 MED ORDER — SODIUM CHLORIDE 0.9 % IV SOLN: INTRAVENOUS | Status: DC

## 2020-05-20 MED ORDER — PROPOFOL 10 MG/ML IV BOLUS
INTRAVENOUS | Status: DC | PRN
  Administered 2020-05-20: 30 mg via INTRAVENOUS
  Administered 2020-05-20: 20 mg via INTRAVENOUS
  Administered 2020-05-20 (×2): 50 mg via INTRAVENOUS

## 2020-05-20 SURGICAL SUPPLY — 25 items

## 2020-05-20 NOTE — Op Note (Signed)
[facility] Patient Name: Scott [name] Procedure Date: 05/20/2020 MRN: [id number] Attending MD: Carol Ada , MD Date of Birth: [birth year] CSN: [id number] Age: 54 Admit Type: Outpatient Procedure:                Colonoscopy Indications:              Hematochezia, Iron deficiency anemia Providers:                Carol Ada, MD, Baird Cancer, RN, Cherylynn Ridges,                            Technician, Christell Faith, CRNA Referring MD:              Medicines:                Propofol per Anesthesia Complications:            No immediate complications. Estimated Blood Loss:     Estimated blood loss: none. Procedure:                Pre-Anesthesia Assessment:                           - Prior to the procedure, a History and Physical                            was performed, and patient medications and                            allergies were reviewed. The patient's tolerance of                            previous anesthesia was also reviewed. The risks                            and benefits of the procedure and the sedation                            options and risks were discussed with the patient.                            All questions were answered, and informed consent                            was obtained. Prior Anticoagulants: The patient has                            taken no previous anticoagulant or antiplatelet                            agents. ASA Grade Assessment: III - A patient with                            severe systemic disease. After reviewing the risks  and benefits, the patient was deemed in                            satisfactory condition to undergo the procedure.                           - Sedation was administered by an anesthesia                            professional. Deep sedation was attained.                           After obtaining informed consent, the colonoscope                            was  passed under direct vision. Throughout the                            procedure, the patient's blood pressure, pulse, and                            oxygen saturations were monitored continuously. The                            [id number]-[id number] ([id number] Olympus pediatric colonscope                            was introduced through the anus and advanced to the                            the cecum, identified by appendiceal orifice and                            ileocecal valve. The colonoscopy was performed                            without difficulty. The patient tolerated the                            procedure well. The quality of the bowel                            preparation was good. The ileocecal valve,                            appendiceal orifice, and rectum were photographed. Scope In: 12:15:20 PM Scope Out: 12:34:02 PM Scope Withdrawal Time: 0 hours 13 minutes 38 seconds  Total Procedure Duration: 0 hours 18 minutes 42 seconds  Findings:      A few small-mouthed diverticula were found in the sigmoid colon.      In the retroflexed position a small amount of blood was noted at the       hemorrhoids. This is consistent with his complaints of hematochezia. Impression:               -  Diverticulosis in the sigmoid colon.                           - No specimens collected. Moderate Sedation:      Not Applicable - Patient had care per Anesthesia. Recommendation:           - Patient has a contact number available for                            emergencies. The signs and symptoms of potential                            delayed complications were discussed with the                            patient. Return to normal activities tomorrow.                            Written discharge instructions were provided to the                            patient.                           - Resume previous diet.                           - Continue present medications.                           -  Repeat colonoscopy in 10 years for screening                            purposes.                           - Hemorrhoidal banding. If the anemia does not                            resolve with banding and iron supplementation, a                            VCE will be performed. Procedure Code(s):        --- Professional ---                           [phone], Colonoscopy, flexible; diagnostic, including                            collection of specimen(s) by brushing or washing,                            when performed (separate procedure) Diagnosis Code(s):        --- Professional ---                           K92.1,  Melena (includes Hematochezia)                           D50.9, Iron deficiency anemia, unspecified                           K57.30, Diverticulosis of large intestine without                            perforation or abscess without bleeding CPT copyright 2019 [employer]. All rights reserved. The codes documented in this report are preliminary and upon coder review may  be revised to meet current compliance requirements. Carol Ada, MD Carol Ada, MD 05/20/2020 12:40:40 PM This report has been signed electronically. Number of Addenda: 0

## 2020-05-20 NOTE — Anesthesia Preprocedure Evaluation (Signed)
Anesthesia Evaluation  Patient identified by MRN, date of birth, ID band Patient awake    Reviewed: Allergy & Precautions, H&P , NPO status , Patient's Chart, lab work & pertinent test results, reviewed documented beta blocker date and time   Airway Mallampati: III  TM Distance: >3 FB Neck ROM: Full    Dental no notable dental hx. (+) Teeth Intact   Pulmonary sleep apnea and Continuous Positive Airway Pressure Ventilation ,    Pulmonary exam normal        Cardiovascular hypertension, Pt. on medications and Pt. on home beta blockers Normal cardiovascular exam     Neuro/Psych Anxiety negative neurological ROS  negative psych ROS   GI/Hepatic Neg liver ROS, GERD  Medicated and Controlled,  Endo/Other  diabetes, Type 2, Oral Hypoglycemic AgentsMorbid obesity  Renal/GU negative Renal ROS  negative genitourinary   Musculoskeletal   Abdominal (+) + obese,   Peds  Hematology negative hematology ROS (+)   Anesthesia Other Findings   Reproductive/Obstetrics negative OB ROS                             Anesthesia Physical  Anesthesia Plan  ASA: III  Anesthesia Plan: MAC   Post-op Pain Management:    Induction:   PONV Risk Score and Plan: TIVA and Propofol infusion  Airway Management Planned: Nasal Cannula  Additional Equipment:   Intra-op Plan:   Post-operative Plan:   Informed Consent: I have reviewed the patients History and Physical, chart, labs and discussed the procedure including the risks, benefits and alternatives for the proposed anesthesia with the patient or authorized representative who has indicated his/her understanding and acceptance.       Plan Discussed with: CRNA  Anesthesia Plan Comments:         Anesthesia Quick Evaluation

## 2020-05-20 NOTE — H&P (Signed)
Scott [name] HPI: The patient's EGD/colonoscopy on 06/22/2016 for complaints of hematochezia and diarrhea was normal. Since the colonoscopy the patient reports that he continues to suffer with hematochezia. His PCP was evaluated by his and the stool test showed that he was heme positive. Additionally, his most recent HGB was at 10.8 g/dL with an MCV of 66 on 03/2020. In comparison to his 2017 and prior HGBs, which were at 14-15 g/dL, there is a definite drop. The patient has a history bleeding for the past 20 years. At times his bleeding is less, but he does notice some clots.    Past Medical History:  Diagnosis Date  . Anxiety   . Back pain    pinched nerve sciatica  . Diabetes mellitus without complication ([place])    type  2  . Elevated cholesterol with elevated triglycerides   . Elevated liver enzymes 2014   normal now  . GERD (gastroesophageal reflux disease)   . Hypertension   . Hypertension   . Sleep apnea    uses cpap,setting of 14    Past Surgical History:  Procedure Laterality Date  . COLONOSCOPY N/A 09/29/2012   Procedure: COLONOSCOPY;  Surgeon: Juanita Craver, MD;  Location: [facility];  Service: Endoscopy;  Laterality: N/A;  . COLONOSCOPY WITH PROPOFOL N/A 06/22/2016   Procedure: COLONOSCOPY WITH PROPOFOL;  Surgeon: Carol Ada, MD;  Location: [facility];  Service: Endoscopy;  Laterality: N/A;  . ESOPHAGOGASTRODUODENOSCOPY N/A 09/29/2012   Procedure: ESOPHAGOGASTRODUODENOSCOPY (EGD);  Surgeon: Juanita Craver, MD;  Location: [facility];  Service: Endoscopy;  Laterality: N/A;  . ESOPHAGOGASTRODUODENOSCOPY (EGD) WITH PROPOFOL N/A 06/22/2016   Procedure: ESOPHAGOGASTRODUODENOSCOPY (EGD) WITH PROPOFOL;  Surgeon: Carol Ada, MD;  Location: [facility];  Service: Endoscopy;  Laterality: N/A;  . FRACTURE SURGERY     right wrist with screws  . HERNIA REPAIR  2008   inguinal    Family History  Problem Relation Age of Onset  . Diabetes Mother   . Hypertension  Mother   . Hyperlipidemia Mother   . Cancer Father   . Thyroid disease Sister     Social History:  reports that he has never smoked. He has never used smokeless tobacco. He reports current alcohol use. He reports current drug use. Drug: Marijuana.  Allergies:  Allergies  Allergen Reactions  . Trulicity [Dulaglutide] Other (See Comments)    Weakness - severe  . Metformin And Related Diarrhea    Diarrhea not frequent    Medications:  Scheduled:  Continuous: . sodium chloride      No results found for this or any previous visit (from the past 24 hour(s)).   No results found.  ROS:  As stated above in the HPI otherwise negative.  Height 6\' 1"  (1.854 m), weight 135.6 kg.    PE: Gen: NAD, Alert and Oriented HEENT:  [place]/AT, EOMI Neck: Supple, no LAD Lungs: CTA Bilaterally CV: RRR without M/G/R ABD: Soft, NTND, +BS Ext: No C/C/E  Assessment/Plan: 1) Hematochezia. 2) IDA.   There is clearly a change in his HGB and further evaluation with an enteroscopy/colonoscopy is needed. The random biopsies to evaluate his diarrhea last time was normal, but repeat biopsies will be obtained. The patient has severe sleep apnea and his case will be performed at the hospital again, even though he lost 30+ lbs over the years. If the work up is negative, hemorrhoidal banding may be performed. I have discussed the risks of bleeding, infection, perforation, medication reactions, a 10% miss rate for a  small colon cancer or polyp, and the risk of death. All questions were answered and the patient acknowledges these risks and wishes to proceed.  Geraldin Habermehl D 05/20/2020, 11:25 AM

## 2020-05-20 NOTE — Transfer of Care (Signed)
Immediate Anesthesia Transfer of Care Note  Patient: Scott [name]  Procedure(s) Performed: COLONOSCOPY WITH PROPOFOL (N/A ) ENTEROSCOPY (N/A )  Patient Location: Endoscopy Unit  Anesthesia Type:MAC  Level of Consciousness: awake, alert , oriented and patient cooperative  Airway & Oxygen Therapy: Patient Spontanous Breathing and Patient connected to face mask oxygen  Post-op Assessment: Report given to RN and Post -op Vital signs reviewed and stable  Post vital signs: Reviewed and stable  Last Vitals:  Vitals Value Taken Time  BP 131/55 1241  Temp    Pulse 109 05/20/20 1240  Resp 28 05/20/20 1240  SpO2 100 % 05/20/20 1240  Vitals shown include unvalidated device data.  Last Pain:  Vitals:   05/20/20 1132  TempSrc: Axillary  PainSc: 0-No pain         Complications: No complications documented.

## 2020-05-20 NOTE — Discharge Instructions (Signed)

## 2020-05-20 NOTE — Op Note (Signed)
[facility] Patient Name: Scott [name] Procedure Date: 05/20/2020 MRN: [id number] Attending MD: Carol Ada , MD Date of Birth: Nov 06, 1965 CSN: [id number] Age: 54 Admit Type: Outpatient Procedure:                Small bowel enteroscopy Indications:              Iron deficiency anemia Providers:                Carol Ada, MD, Baird Cancer, RN, Cherylynn Ridges,                            Technician ,Christell Faith, CRNA Referring MD:              Medicines:                Propofol per Anesthesia Complications:            No immediate complications. Estimated Blood Loss:     Estimated blood loss: none. Procedure:                Pre-Anesthesia Assessment:                           - Prior to the procedure, a History and Physical                            was performed, and patient medications and                            allergies were reviewed. The patient's tolerance of                            previous anesthesia was also reviewed. The risks                            and benefits of the procedure and the sedation                            options and risks were discussed with the patient.                            All questions were answered, and informed consent                            was obtained. Prior Anticoagulants: The patient has                            taken no previous anticoagulant or antiplatelet                            agents. ASA Grade Assessment: III - A patient with                            severe systemic disease. After reviewing the risks  and benefits, the patient was deemed in                            satisfactory condition to undergo the procedure.                           - Sedation was administered by an anesthesia                            professional. Deep sedation was attained.                           After obtaining informed consent, the endoscope was                             passed under direct vision. Throughout the                            procedure, the patient's blood pressure, pulse, and                            oxygen saturations were monitored continuously. The                            PCF-H190DL ([id number]) Olympus pediatric colonscope                            was introduced through the mouth, and advanced to                            the small bowel distal to the Ligament of [place]itz.                            After obtaining informed consent, the endoscope was                            passed under direct vision. Throughout the                            procedure, the patient's blood pressure, pulse, and                            oxygen saturations were monitored continuously. The                            small bowel enteroscopy was accomplished without                            difficulty. The patient tolerated the procedure                            well. Scope In: Scope Out: Findings:      The esophagus was normal.      The stomach was normal.      The examined  duodenum was normal.      There was no evidence of significant pathology in the entire examined       portion of jejunum. Impression:               - Normal esophagus.                           - Normal stomach.                           - Normal examined duodenum.                           - The examined portion of the jejunum was normal.                           - No specimens collected. Recommendation:           - Patient has a contact number available for                            emergencies. The signs and symptoms of potential                            delayed complications were discussed with the                            patient. Return to normal activities tomorrow.                            Written discharge instructions were provided to the                            patient.                           - Resume previous diet. Procedure Code(s):        ---  Professional ---                           [id number], Small intestinal endoscopy, enteroscopy                            beyond second portion of duodenum, not including                            ileum; diagnostic, including collection of                            specimen(s) by brushing or washing, when performed                            (separate procedure) Diagnosis Code(s):        --- Professional ---                           D50.9, Iron deficiency anemia, unspecified CPT copyright 2019  American Medical Association. All rights reserved. The codes documented in this report are preliminary and upon coder review may  be revised to meet current compliance requirements. Carol Ada, MD Carol Ada, MD 05/20/2020 12:42:23 PM This report has been signed electronically. Number of Addenda: 0

## 2020-05-23 ENCOUNTER — Encounter (HOSPITAL_COMMUNITY): Payer: Self-pay | Admitting: Gastroenterology

## 2020-05-23 NOTE — Progress Notes (Signed)
Post procedure call made but reached generic voicemail - no message left.

## 2020-06-03 NOTE — Anesthesia Postprocedure Evaluation (Signed)
Anesthesia Post Note  Patient: Scott [name]  Procedure(s) Performed: COLONOSCOPY WITH PROPOFOL (N/A ) ENTEROSCOPY (N/A )     Patient location during evaluation: Endoscopy Anesthesia Type: MAC Level of consciousness: awake Pain management: pain level not controlled Vital Signs Assessment: post-procedure vital signs reviewed and stable Respiratory status: spontaneous breathing Cardiovascular status: stable Postop Assessment: no apparent nausea or vomiting Anesthetic complications: no   No complications documented.  Last Vitals:  Vitals:   05/20/20 1250 05/20/20 1259  BP: (!) 144/83 (!) 165/83  Pulse: (!) 104 82  Resp: 18 20  Temp:    SpO2: 99% 97%    Last Pain:  Vitals:   05/20/20 1259  TempSrc:   PainSc: 7    Pain Goal:                   Huston Foley

## 2020-06-08 ENCOUNTER — Other Ambulatory Visit: Payer: Self-pay | Admitting: Family Medicine

## 2020-06-08 DIAGNOSIS — M25562 Pain in left knee: Secondary | ICD-10-CM

## 2020-06-14 ENCOUNTER — Other Ambulatory Visit: Payer: Self-pay

## 2020-06-14 ENCOUNTER — Ambulatory Visit
Admission: RE | Admit: 2020-06-14 | Discharge: 2020-06-14 | Disposition: A | Discharge status: Home / Self Care | Payer: 59 | Source: Ambulatory Visit | Attending: Family Medicine | Admitting: Family Medicine

## 2020-06-14 DIAGNOSIS — M25562 Pain in left knee: Secondary | ICD-10-CM

## 2020-06-16 ENCOUNTER — Other Ambulatory Visit: Payer: Self-pay | Admitting: Orthopedic Surgery

## 2020-06-17 ENCOUNTER — Other Ambulatory Visit: Payer: Self-pay | Admitting: Orthopedic Surgery

## 2020-06-20 NOTE — Patient Instructions (Addendum)
DUE TO COVID-19 ONLY ONE VISITOR IS ALLOWED TO COME WITH YOU AND STAY IN THE WAITING ROOM ONLY DURING PRE OP AND PROCEDURE DAY OF SURGERY. THE 1 VISITOR  MAY VISIT WITH YOU AFTER SURGERY IN YOUR PRIVATE ROOM DURING VISITING HOURS ONLY!  YOU NEED TO HAVE A COVID 19 TEST ON: 06/21/20 @ 10:00 AM, THIS TEST MUST BE DONE BEFORE SURGERY,  COVID TESTING SITE [place] [address], IT IS ON THE RIGHT GOING OUT [address] APPROXIMATELY  2 MINUTES PAST [employer] ON THE RIGHT. ONCE YOUR COVID TEST IS COMPLETED,  PLEASE BEGIN THE QUARANTINE INSTRUCTIONS AS OUTLINED IN YOUR HANDOUT.                Scott [name]    Your procedure is scheduled on: 06/24/20   Report to [facility] Main  Entrance   Report to admitting at: 12:00 PM    Call this number if you have problems the morning of surgery [phone]    Remember: Do not eat solid food :After Midnight. Clear liquids from midnight until: 11:00 am.    CLEAR LIQUID DIET   Foods Allowed                                                                     Foods Excluded  Coffee and tea, regular and decaf                             liquids that you cannot  Plain Jell-O any favor except red or purple                                           see through such as: Fruit ices (not with fruit pulp)                                     milk, soups, orange juice  Iced Popsicles                                    All solid food Carbonated beverages, regular and diet                                    Cranberry, grape and apple juices Sports drinks like Gatorade Lightly seasoned clear broth or consume(fat free) Sugar, honey syrup  Sample Menu Breakfast                                Lunch                                     Supper Cranberry juice  Beef broth                            Chicken broth Jell-O                                     Grape juice                           Apple  juice Coffee or tea                        Jell-O                                      Popsicle                                                Coffee or tea                        Coffee or tea  _____________________________________________________________________  BRUSH YOUR TEETH MORNING OF SURGERY AND RINSE YOUR MOUTH OUT, NO CHEWING GUM CANDY OR MINTS.     Take these medicines the morning of surgery with A SIP OF WATER: Buspar,omeprazole. Use flonase and eye drops as usual. How to Manage Your Diabetes Before and After Surgery  Why is it important to control my blood sugar before and after surgery? . Improving blood sugar levels before and after surgery helps healing and can limit problems. . A way of improving blood sugar control is eating a healthy diet by: o  Eating less sugar and carbohydrates o  Increasing activity/exercise o  Talking with your doctor about reaching your blood sugar goals . High blood sugars (greater than 180 mg/dL) can raise your risk of infections and slow your recovery, so you will need to focus on controlling your diabetes during the weeks before surgery. . Make sure that the doctor who takes care of your diabetes knows about your planned surgery including the date and location.  How do I manage my blood sugar before surgery? . Check your blood sugar at least 4 times a day, starting 2 days before surgery, to make sure that the level is not too high or low. o Check your blood sugar the morning of your surgery when you wake up and every 2 hours until you get to the Short Stay unit. . If your blood sugar is less than 70 mg/dL, you will need to treat for low blood sugar: o Do not take insulin. o Treat a low blood sugar (less than 70 mg/dL) with  cup of clear juice (cranberry or apple), 4 glucose tablets, OR glucose gel. o Recheck blood sugar in 15 minutes after treatment (to make sure it is greater than 70 mg/dL). If your blood sugar is not greater than 70 mg/dL  on recheck, call [phone] for further instructions. . Report your blood sugar to the short stay nurse when you get to Short Stay.  . If you are admitted to the hospital after surgery: o Your blood sugar will be checked by the staff  and you will probably be given insulin after surgery (instead of oral diabetes medicines) to make sure you have good blood sugar levels. o The goal for blood sugar control after surgery is 80-180 mg/dL.   WHAT DO I DO ABOUT MY DIABETES MEDICATION?  Marland Kitchen Do not take oral diabetes medicines (pills) the morning of surgery.  . THE DAY BEFORE SURGERY, take Rybelsus as usual.DO NOT take Invokamet XR.     . THE MORNING OF SURGERY, DO NOT TAKE ANY DIABETIC MEDICATIONS DAY OF YOUR SURGERY                               You may not have any metal on your body including hair pins and              piercings  Do not wear jewelry, lotions, powders or perfumes, deodorant             Men may shave face and neck.   Do not bring valuables to the hospital. [place].  Contacts, dentures or bridgework may not be worn into surgery.  Leave suitcase in the car. After surgery it may be brought to your room.     Patients discharged the day of surgery will not be allowed to drive home. IF YOU ARE HAVING SURGERY AND GOING HOME THE SAME DAY, YOU MUST HAVE AN ADULT TO DRIVE YOU HOME AND BE WITH YOU FOR 24 HOURS. YOU MAY GO HOME BY TAXI OR UBER OR ORTHERWISE, BUT AN ADULT MUST ACCOMPANY YOU HOME AND STAY WITH YOU FOR 24 HOURS.  Name and phone number of your driver:  Special Instructions: N/A              Please read over the following fact sheets you were given: _____________________________________________________________________         [facility] - Preparing for Surgery Before surgery, you can play an important role.  Because skin is not sterile, your skin needs to be as free of germs as possible.  You can reduce the number of  germs on your skin by washing with CHG (chlorahexidine gluconate) soap before surgery.  CHG is an antiseptic cleaner which kills germs and bonds with the skin to continue killing germs even after washing. Please DO NOT use if you have an allergy to CHG or antibacterial soaps.  If your skin becomes reddened/irritated stop using the CHG and inform your nurse when you arrive at Short Stay. Do not shave (including legs and underarms) for at least 48 hours prior to the first CHG shower.  You may shave your face/neck. Please follow these instructions carefully:  1.  Shower with CHG Soap the night before surgery and the  morning of Surgery.  2.  If you choose to wash your hair, wash your hair first as usual with your  normal  shampoo.  3.  After you shampoo, rinse your hair and body thoroughly to remove the  shampoo.                           4.  Use CHG as you would any other liquid soap.  You can apply chg directly  to the skin and wash  Gently with a scrungie or clean washcloth.  5.  Apply the CHG Soap to your body ONLY FROM THE NECK DOWN.   Do not use on face/ open                           Wound or open sores. Avoid contact with eyes, ears mouth and genitals (private parts).                       Wash face,  Genitals (private parts) with your normal soap.             6.  Wash thoroughly, paying special attention to the area where your surgery  will be performed.  7.  Thoroughly rinse your body with warm water from the neck down.  8.  DO NOT shower/wash with your normal soap after using and rinsing off  the CHG Soap.                9.  Pat yourself dry with a clean towel.            10.  Wear clean pajamas.            11.  Place clean sheets on your bed the night of your first shower and do not  sleep with pets. Day of Surgery : Do not apply any lotions/deodorants the morning of surgery.  Please wear clean clothes to the hospital/surgery center.  FAILURE TO FOLLOW THESE  INSTRUCTIONS MAY RESULT IN THE CANCELLATION OF YOUR SURGERY PATIENT SIGNATURE_________________________________  NURSE SIGNATURE__________________________________  ________________________________________________________________________   Adam Phenix  An incentive spirometer is a tool that can help keep your lungs clear and active. This tool measures how well you are filling your lungs with each breath. Taking long deep breaths may help reverse or decrease the chance of developing breathing (pulmonary) problems (especially infection) following:  A long period of time when you are unable to move or be active. BEFORE THE PROCEDURE   If the spirometer includes an indicator to show your best effort, your nurse or respiratory therapist will set it to a desired goal.  If possible, sit up straight or lean slightly forward. Try not to slouch.  Hold the incentive spirometer in an upright position. INSTRUCTIONS FOR USE  1. Sit on the edge of your bed if possible, or sit up as far as you can in bed or on a chair. 2. Hold the incentive spirometer in an upright position. 3. Breathe out normally. 4. Place the mouthpiece in your mouth and seal your lips tightly around it. 5. Breathe in slowly and as deeply as possible, raising the piston or the ball toward the top of the column. 6. Hold your breath for 3-5 seconds or for as long as possible. Allow the piston or ball to fall to the bottom of the column. 7. Remove the mouthpiece from your mouth and breathe out normally. 8. Rest for a few seconds and repeat Steps 1 through 7 at least 10 times every 1-2 hours when you are awake. Take your time and take a few normal breaths between deep breaths. 9. The spirometer may include an indicator to show your best effort. Use the indicator as a goal to work toward during each repetition. 10. After each set of 10 deep breaths, practice coughing to be sure your lungs are clear. If you have an incision (the  cut made at the time of  surgery), support your incision when coughing by placing a pillow or rolled up towels firmly against it. Once you are able to get out of bed, walk around indoors and cough well. You may stop using the incentive spirometer when instructed by your caregiver.  RISKS AND COMPLICATIONS  Take your time so you do not get dizzy or light-headed.  If you are in pain, you may need to take or ask for pain medication before doing incentive spirometry. It is harder to take a deep breath if you are having pain. AFTER USE  Rest and breathe slowly and easily.  It can be helpful to keep track of a log of your progress. Your caregiver can provide you with a simple table to help with this. If you are using the spirometer at home, follow these instructions: [place] IF:   You are having difficultly using the spirometer.  You have trouble using the spirometer as often as instructed.  Your pain medication is not giving enough relief while using the spirometer.  You develop fever of 100.5 F (38.1 C) or higher. SEEK IMMEDIATE MEDICAL CARE IF:   You cough up bloody sputum that had not been present before.  You develop fever of 102 F (38.9 C) or greater.  You develop worsening pain at or near the incision site. MAKE SURE YOU:   Understand these instructions.  Will watch your condition.  Will get help right away if you are not doing well or get worse. Document Released: 12/03/2006 Document Revised: 10/15/2011 Document Reviewed: 02/03/2007 [facility] Patient Information 2014 [place], [place].   ________________________________________________________________________

## 2020-06-21 ENCOUNTER — Encounter (HOSPITAL_COMMUNITY)
Admission: RE | Admit: 2020-06-21 | Discharge: 2020-06-21 | Disposition: A | Discharge status: Home / Self Care | Payer: 59 | Source: Ambulatory Visit | Attending: Orthopedic Surgery | Admitting: Orthopedic Surgery

## 2020-06-21 ENCOUNTER — Encounter (HOSPITAL_COMMUNITY): Payer: Self-pay

## 2020-06-21 ENCOUNTER — Other Ambulatory Visit (HOSPITAL_COMMUNITY)
Admission: RE | Admit: 2020-06-21 | Discharge: 2020-06-21 | Disposition: A | Discharge status: Home / Self Care | Payer: 59 | Source: Ambulatory Visit | Attending: Orthopedic Surgery | Admitting: Orthopedic Surgery

## 2020-06-21 ENCOUNTER — Other Ambulatory Visit: Payer: Self-pay

## 2020-06-21 DIAGNOSIS — Z20822 Contact with and (suspected) exposure to covid-19: Secondary | ICD-10-CM | POA: Diagnosis not present

## 2020-06-21 DIAGNOSIS — Z01818 Encounter for other preprocedural examination: Secondary | ICD-10-CM | POA: Insufficient documentation

## 2020-06-21 HISTORY — DX: Anemia, unspecified: D64.9

## 2020-06-21 HISTORY — DX: Unspecified osteoarthritis, unspecified site: M19.90

## 2020-06-21 LAB — CBC
HCT: 43.5 % (ref 39.0–52.0)
Hemoglobin: 13.6 g/dL (ref 13.0–17.0)
MCH: 25.9 pg — ABNORMAL LOW (ref 26.0–34.0)
MCHC: 31.3 g/dL (ref 30.0–36.0)
MCV: 82.9 fL (ref 80.0–100.0)
Platelets: 257 10*3/uL (ref 150–400)
RBC: 5.25 MIL/uL (ref 4.22–5.81)
RDW: 15.5 % (ref 11.5–15.5)
WBC: 5.1 10*3/uL (ref 4.0–10.5)
nRBC: 0 % (ref 0.0–0.2)

## 2020-06-21 LAB — SARS CORONAVIRUS 2 (TAT 6-24 HRS): SARS Coronavirus 2: NEGATIVE

## 2020-06-21 LAB — BASIC METABOLIC PANEL
Anion gap: 8 (ref 5–15)
BUN: 16 mg/dL (ref 6–20)
CO2: 27 mmol/L (ref 22–32)
Calcium: 9.1 mg/dL (ref 8.9–10.3)
Chloride: 103 mmol/L (ref 98–111)
Creatinine, Ser: 0.74 mg/dL (ref 0.61–1.24)
GFR, Estimated: >60 mL/min (ref >60)
Glucose, Bld: 124 mg/dL — ABNORMAL HIGH (ref 70–99)
Potassium: 3.6 mmol/L (ref 3.5–5.1)
Sodium: 138 mmol/L (ref 135–145)

## 2020-06-21 LAB — HEMOGLOBIN A1C
Hgb A1c MFr Bld: 6.6 % — ABNORMAL HIGH (ref 4.8–5.6)
Mean Plasma Glucose: 142.72 mg/dL

## 2020-06-21 LAB — GLUCOSE, CAPILLARY: Glucose-Capillary: 137 mg/dL — ABNORMAL HIGH (ref 70–99)

## 2020-06-21 NOTE — Progress Notes (Signed)
COVID Vaccine Completed: Yes Date COVID Vaccine completed: 11/20/19 COVID vaccine manufacturer:     Moderna     PCP - Dr. Shirline Frees Cardiologist - NO  Chest x-ray -  EKG -  Stress Test -  ECHO -  Cardiac Cath -  Pacemaker/ICD device last checked:  Sleep Study - YES CPAP - YES  Fasting Blood Sugar - 140's Checks Blood Sugar __2___ times a week.  Blood Thinner Instructions: Aspirin Instructions: Last Dose:  Anesthesia review: Hx: OSA(CPAP),DIA,HTN  Patient denies shortness of breath, fever, cough and chest pain at PAT appointment   Patient verbalized understanding of instructions that were given to them at the PAT appointment. Patient was also instructed that they will need to review over the PAT instructions again at home before surgery.

## 2020-06-23 MED ORDER — DEXTROSE 5 % IV SOLN
3.0000 g | INTRAVENOUS | Status: AC
  Administered 2020-06-24: 3 g via INTRAVENOUS
  Filled 2020-06-23: qty 3

## 2020-06-23 NOTE — Progress Notes (Signed)
Pt. Was informed about time change for surgery.He is aware to arrive at 11:00 am tomorrow.Also to drink ensure at: 10:00 am.

## 2020-06-24 ENCOUNTER — Encounter (HOSPITAL_COMMUNITY)
Admission: RE | Disposition: A | Discharge status: Home / Self Care | Payer: Self-pay | Source: Other Acute Inpatient Hospital | Attending: Orthopedic Surgery

## 2020-06-24 ENCOUNTER — Ambulatory Visit (HOSPITAL_COMMUNITY)
Admission: RE | Admit: 2020-06-24 | Discharge: 2020-06-24 | Disposition: A | Discharge status: Home / Self Care | Payer: 59 | Source: Other Acute Inpatient Hospital | Attending: Orthopedic Surgery | Admitting: Orthopedic Surgery

## 2020-06-24 ENCOUNTER — Ambulatory Visit (HOSPITAL_COMMUNITY): Payer: 59 | Admitting: Certified Registered Nurse Anesthetist

## 2020-06-24 ENCOUNTER — Encounter (HOSPITAL_COMMUNITY): Payer: Self-pay | Admitting: Orthopedic Surgery

## 2020-06-24 DIAGNOSIS — Z888 Allergy status to other drugs, medicaments and biological substances status: Secondary | ICD-10-CM | POA: Insufficient documentation

## 2020-06-24 DIAGNOSIS — M7122 Synovial cyst of popliteal space [Baker], left knee: Secondary | ICD-10-CM | POA: Insufficient documentation

## 2020-06-24 DIAGNOSIS — X58XXXA Exposure to other specified factors, initial encounter: Secondary | ICD-10-CM | POA: Insufficient documentation

## 2020-06-24 DIAGNOSIS — Z7984 Long term (current) use of oral hypoglycemic drugs: Secondary | ICD-10-CM | POA: Diagnosis not present

## 2020-06-24 DIAGNOSIS — M2242 Chondromalacia patellae, left knee: Secondary | ICD-10-CM | POA: Insufficient documentation

## 2020-06-24 DIAGNOSIS — S83242A Other tear of medial meniscus, current injury, left knee, initial encounter: Secondary | ICD-10-CM | POA: Diagnosis not present

## 2020-06-24 DIAGNOSIS — Z79899 Other long term (current) drug therapy: Secondary | ICD-10-CM | POA: Diagnosis not present

## 2020-06-24 DIAGNOSIS — Z791 Long term (current) use of non-steroidal anti-inflammatories (NSAID): Secondary | ICD-10-CM | POA: Diagnosis not present

## 2020-06-24 HISTORY — PX: KNEE ARTHROSCOPY: SHX127

## 2020-06-24 LAB — GLUCOSE, CAPILLARY: Glucose-Capillary: 121 mg/dL — ABNORMAL HIGH (ref 70–99)

## 2020-06-24 SURGERY — ARTHROSCOPY, KNEE
Anesthesia: General | Site: Knee | Laterality: Left

## 2020-06-24 MED ORDER — PROPOFOL 10 MG/ML IV BOLUS
INTRAVENOUS | Status: AC
  Filled 2020-06-24: qty 40

## 2020-06-24 MED ORDER — DEXAMETHASONE SODIUM PHOSPHATE 10 MG/ML IJ SOLN
INTRAMUSCULAR | Status: DC | PRN
  Administered 2020-06-24: 8 mg via INTRAVENOUS

## 2020-06-24 MED ORDER — EPINEPHRINE PF 1 MG/ML IJ SOLN
INTRAMUSCULAR | Status: AC
  Filled 2020-06-24: qty 1

## 2020-06-24 MED ORDER — FENTANYL CITRATE (PF) 100 MCG/2ML IJ SOLN: 25.0000 ug | INTRAMUSCULAR | Status: DC | PRN

## 2020-06-24 MED ORDER — SODIUM CHLORIDE 0.9 % IR SOLN
Status: DC | PRN
  Administered 2020-06-24 (×2): 3000 mL

## 2020-06-24 MED ORDER — MIDAZOLAM HCL 5 MG/5ML IJ SOLN
INTRAMUSCULAR | Status: DC | PRN
  Administered 2020-06-24 (×2): 1 mg via INTRAVENOUS

## 2020-06-24 MED ORDER — FENTANYL CITRATE (PF) 100 MCG/2ML IJ SOLN
INTRAMUSCULAR | Status: AC
  Administered 2020-06-24: 25 ug via INTRAVENOUS
  Filled 2020-06-24: qty 2

## 2020-06-24 MED ORDER — PROPOFOL 10 MG/ML IV BOLUS
INTRAVENOUS | Status: DC | PRN
  Administered 2020-06-24: 200 mg via INTRAVENOUS

## 2020-06-24 MED ORDER — PROMETHAZINE HCL 25 MG/ML IJ SOLN: 6.2500 mg | INTRAMUSCULAR | Status: DC | PRN

## 2020-06-24 MED ORDER — LIDOCAINE 2% (20 MG/ML) 5 ML SYRINGE
INTRAMUSCULAR | Status: DC | PRN
  Administered 2020-06-24: 100 mg via INTRAVENOUS

## 2020-06-24 MED ORDER — OXYCODONE HCL 5 MG PO TABS
ORAL_TABLET | ORAL | Status: AC
  Filled 2020-06-24: qty 1

## 2020-06-24 MED ORDER — CHLORHEXIDINE GLUCONATE 0.12 % MT SOLN
15.0000 mL | Freq: Once | OROMUCOSAL | Status: AC
  Administered 2020-06-24: 15 mL via OROMUCOSAL

## 2020-06-24 MED ORDER — LACTATED RINGERS IV SOLN: INTRAVENOUS | Status: DC

## 2020-06-24 MED ORDER — BUPIVACAINE HCL (PF) 0.5 % IJ SOLN
INTRAMUSCULAR | Status: DC | PRN
  Administered 2020-06-24: 20 mL

## 2020-06-24 MED ORDER — KETOROLAC TROMETHAMINE 30 MG/ML IJ SOLN: 30.0000 mg | Freq: Once | INTRAMUSCULAR | Status: DC

## 2020-06-24 MED ORDER — FENTANYL CITRATE (PF) 100 MCG/2ML IJ SOLN: 50.0000 ug | INTRAMUSCULAR | Status: DC

## 2020-06-24 MED ORDER — MIDAZOLAM HCL 2 MG/2ML IJ SOLN: 1.0000 mg | INTRAMUSCULAR | Status: DC

## 2020-06-24 MED ORDER — ORAL CARE MOUTH RINSE: 15.0000 mL | Freq: Once | OROMUCOSAL | Status: AC

## 2020-06-24 MED ORDER — ONDANSETRON HCL 4 MG/2ML IJ SOLN
INTRAMUSCULAR | Status: AC
  Filled 2020-06-24: qty 2

## 2020-06-24 MED ORDER — OXYCODONE HCL 5 MG PO TABS
5.0000 mg | ORAL_TABLET | Freq: Four times a day (QID) | ORAL | 0 refills | Status: AC | PRN
Start: 2020-06-24 — End: 2021-06-24

## 2020-06-24 MED ORDER — LIDOCAINE 2% (20 MG/ML) 5 ML SYRINGE
INTRAMUSCULAR | Status: AC
  Filled 2020-06-24: qty 5

## 2020-06-24 MED ORDER — ACETAMINOPHEN 10 MG/ML IV SOLN: 1000.0000 mg | Freq: Once | INTRAVENOUS | Status: DC | PRN

## 2020-06-24 MED ORDER — ACETAMINOPHEN 10 MG/ML IV SOLN
INTRAVENOUS | Status: AC
  Administered 2020-06-24: 1000 mg via INTRAVENOUS
  Filled 2020-06-24: qty 100

## 2020-06-24 MED ORDER — EPINEPHRINE PF 1 MG/ML IJ SOLN
INTRAMUSCULAR | Status: DC | PRN
  Administered 2020-06-24: 1 mg

## 2020-06-24 MED ORDER — OXYCODONE HCL 5 MG/5ML PO SOLN: 5.0000 mg | Freq: Once | ORAL | Status: AC | PRN

## 2020-06-24 MED ORDER — FENTANYL CITRATE (PF) 100 MCG/2ML IJ SOLN
INTRAMUSCULAR | Status: AC
  Filled 2020-06-24: qty 2

## 2020-06-24 MED ORDER — OXYCODONE HCL 5 MG PO TABS
5.0000 mg | ORAL_TABLET | Freq: Once | ORAL | Status: AC | PRN
  Administered 2020-06-24: 5 mg via ORAL

## 2020-06-24 MED ORDER — ONDANSETRON HCL 4 MG/2ML IJ SOLN
INTRAMUSCULAR | Status: DC | PRN
  Administered 2020-06-24: 4 mg via INTRAVENOUS

## 2020-06-24 MED ORDER — PHENYLEPHRINE 40 MCG/ML (10ML) SYRINGE FOR IV PUSH (FOR BLOOD PRESSURE SUPPORT)
PREFILLED_SYRINGE | INTRAVENOUS | Status: AC
  Filled 2020-06-24: qty 10

## 2020-06-24 MED ORDER — MIDAZOLAM HCL 2 MG/2ML IJ SOLN
INTRAMUSCULAR | Status: AC
  Filled 2020-06-24: qty 2

## 2020-06-24 MED ORDER — MIDAZOLAM HCL 2 MG/2ML IJ SOLN
INTRAMUSCULAR | Status: AC
  Administered 2020-06-24: 2 mg via INTRAVENOUS
  Filled 2020-06-24: qty 2

## 2020-06-24 MED ORDER — ROPIVACAINE HCL 5 MG/ML IJ SOLN
INTRAMUSCULAR | Status: DC | PRN
  Administered 2020-06-24: 30 mL via PERINEURAL

## 2020-06-24 MED ORDER — BUPIVACAINE-EPINEPHRINE 0.5% -1:200000 IJ SOLN
INTRAMUSCULAR | Status: AC
  Filled 2020-06-24: qty 1

## 2020-06-24 MED ORDER — DEXAMETHASONE SODIUM PHOSPHATE 10 MG/ML IJ SOLN
INTRAMUSCULAR | Status: AC
  Filled 2020-06-24: qty 1

## 2020-06-24 MED ORDER — FENTANYL CITRATE (PF) 100 MCG/2ML IJ SOLN
INTRAMUSCULAR | Status: DC | PRN
  Administered 2020-06-24 (×4): 50 ug via INTRAVENOUS

## 2020-06-24 MED ORDER — BUPIVACAINE HCL (PF) 0.5 % IJ SOLN
INTRAMUSCULAR | Status: AC
  Filled 2020-06-24: qty 30

## 2020-06-24 MED ORDER — DOCUSATE SODIUM 100 MG PO CAPS: 100.0000 mg | ORAL_CAPSULE | Freq: Two times a day (BID) | ORAL | 0 refills | Status: AC

## 2020-06-24 SURGICAL SUPPLY — 43 items
BANDAGE ESMARK 6X9 LF (GAUZE/BANDAGES/DRESSINGS) IMPLANT
BLADE EXCALIBUR 4.0X13 (MISCELLANEOUS) IMPLANT
BLADE SURG 15 STRL LF DISP TIS (BLADE) IMPLANT
BLADE SURG 15 STRL SS (BLADE) ×2
BNDG CMPR 9X6 STRL LF SNTH (GAUZE/BANDAGES/DRESSINGS) ×1
BNDG ELASTIC 6X5.8 VLCR STR LF (GAUZE/BANDAGES/DRESSINGS) ×2 IMPLANT
BNDG ESMARK 6X9 LF (GAUZE/BANDAGES/DRESSINGS) ×2
BOOTIES KNEE HIGH SLOAN (MISCELLANEOUS) ×4 IMPLANT
COVER WAND RF STERILE (DRAPES) IMPLANT
DISSECTOR 4.0MM X 13CM (MISCELLANEOUS) IMPLANT
DRAPE U-SHAPE 47X51 STRL (DRAPES) ×2 IMPLANT
DRSG EMULSION OIL 3X3 NADH (GAUZE/BANDAGES/DRESSINGS) ×1 IMPLANT
DRSG PAD ABDOMINAL 8X10 ST (GAUZE/BANDAGES/DRESSINGS) ×3 IMPLANT
DURAPREP 26ML APPLICATOR (WOUND CARE) ×2 IMPLANT
DW OUTFLOW CASSETTE/TUBE SET (MISCELLANEOUS) ×2 IMPLANT
ELECT PENCIL ROCKER SW 15FT (MISCELLANEOUS) ×1 IMPLANT
ELECT REM PT RETURN 15FT ADLT (MISCELLANEOUS) ×1 IMPLANT
FILTER STRAW (MISCELLANEOUS) ×2 IMPLANT
GAUZE SPONGE 4X4 12PLY STRL (GAUZE/BANDAGES/DRESSINGS) ×2 IMPLANT
GAUZE XEROFORM 1X8 LF (GAUZE/BANDAGES/DRESSINGS) ×1 IMPLANT
GLOVE BIOGEL PI IND STRL 8 (GLOVE) ×2 IMPLANT
GLOVE BIOGEL PI INDICATOR 8 (GLOVE) ×2
GLOVE ECLIPSE 7.5 STRL STRAW (GLOVE) ×4 IMPLANT
GOWN STRL REUS W/TWL XL LVL3 (GOWN DISPOSABLE) ×4 IMPLANT
KIT BASIN OR (CUSTOM PROCEDURE TRAY) ×2 IMPLANT
KIT TURNOVER KIT A (KITS) ×2 IMPLANT
MANIFOLD NEPTUNE II (INSTRUMENTS) ×2 IMPLANT
NDL SAFETY ECLIPSE 18X1.5 (NEEDLE) ×1 IMPLANT
NEEDLE HYPO 18GX1.5 SHARP (NEEDLE) ×2
PACK ARTHROSCOPY WL (CUSTOM PROCEDURE TRAY) ×2 IMPLANT
PAD MASON LEG HOLDER (PIN) IMPLANT
PADDING CAST COTTON 6X4 STRL (CAST SUPPLIES) ×2 IMPLANT
PENCIL SMOKE EVACUATOR (MISCELLANEOUS) IMPLANT
PORT APPOLLO RF 90DEGREE MULTI (SURGICAL WAND) IMPLANT
SPONGE LAP 18X18 RF (DISPOSABLE) ×1 IMPLANT
SUT ETHILON 4 0 PS 2 18 (SUTURE) IMPLANT
SUT MNCRL AB 3-0 PS2 18 (SUTURE) ×1 IMPLANT
SUT MON AB 2-0 CT1 36 (SUTURE) ×1 IMPLANT
SUT VIC AB 0 CT1 36 (SUTURE) ×1 IMPLANT
SUT VIC AB 2-0 CT1 27 (SUTURE) ×2
SUT VIC AB 2-0 CT1 TAPERPNT 27 (SUTURE) IMPLANT
TUBING ARTHROSCOPY IRRIG 16FT (MISCELLANEOUS) ×2 IMPLANT
WRAP KNEE MAXI GEL POST OP (GAUZE/BANDAGES/DRESSINGS) ×2 IMPLANT

## 2020-06-24 NOTE — Anesthesia Procedure Notes (Signed)
Procedure Name: LMA Insertion Date/Time: 06/24/2020 1:53 PM Performed by: Niel Hummer, CRNA Pre-anesthesia Checklist: Emergency Drugs available, Patient identified, Suction available and Patient being monitored Patient Re-evaluated:Patient Re-evaluated prior to induction Oxygen Delivery Method: Circle system utilized Preoxygenation: Pre-oxygenation with 100% oxygen Induction Type: IV induction LMA: LMA with gastric port inserted LMA Size: 5.0 Number of attempts: 1 Dental Injury: Teeth and Oropharynx as per pre-operative assessment

## 2020-06-24 NOTE — Progress Notes (Signed)
Assisted Dr. Ellender with left, ultrasound guided, adductor canal block. Side rails up, monitors on throughout procedure. See vital signs in flow sheet. Tolerated Procedure well.  

## 2020-06-24 NOTE — H&P (Signed)
A pre op hand p   Chief Complaint: Left knee pain  HPI: Scott [name] is a 54 y.o. male who presents for evaluation of left knee pain. It has been present for greater than 3 months and has been worsening. He has failed conservative measures. Pain is rated as moderate.  Past Medical History:  Diagnosis Date  . Anemia   . Anxiety   . Arthritis   . Back pain    pinched nerve sciatica  . Diabetes mellitus without complication ([place])    type  2  . Elevated cholesterol with elevated triglycerides   . Elevated liver enzymes 2014   normal now  . GERD (gastroesophageal reflux disease)   . Hypertension   . Hypertension   . Sleep apnea    uses cpap,setting of 14   Past Surgical History:  Procedure Laterality Date  . CATARACT EXTRACTION Left   . COLONOSCOPY N/A 09/29/2012   Procedure: COLONOSCOPY;  Surgeon: Juanita Craver, MD;  Location: [facility];  Service: Endoscopy;  Laterality: N/A;  . COLONOSCOPY WITH PROPOFOL N/A 06/22/2016   Procedure: COLONOSCOPY WITH PROPOFOL;  Surgeon: Carol Ada, MD;  Location: [facility];  Service: Endoscopy;  Laterality: N/A;  . COLONOSCOPY WITH PROPOFOL N/A 05/20/2020   Procedure: COLONOSCOPY WITH PROPOFOL;  Surgeon: Carol Ada, MD;  Location: [facility];  Service: Endoscopy;  Laterality: N/A;  . ENTEROSCOPY N/A 05/20/2020   Procedure: ENTEROSCOPY;  Surgeon: Carol Ada, MD;  Location: [facility];  Service: Endoscopy;  Laterality: N/A;  . ESOPHAGOGASTRODUODENOSCOPY N/A 09/29/2012   Procedure: ESOPHAGOGASTRODUODENOSCOPY (EGD);  Surgeon: Juanita Craver, MD;  Location: [facility];  Service: Endoscopy;  Laterality: N/A;  . ESOPHAGOGASTRODUODENOSCOPY (EGD) WITH PROPOFOL N/A 06/22/2016   Procedure: ESOPHAGOGASTRODUODENOSCOPY (EGD) WITH PROPOFOL;  Surgeon: Carol Ada, MD;  Location: [facility];  Service: Endoscopy;  Laterality: N/A;  . FRACTURE SURGERY     right wrist with screws  . HERNIA REPAIR  2008   inguinal   Social History    Socioeconomic History  . Marital status: Married    Spouse name: Not on file  . Number of children: Not on file  . Years of education: Not on file  . Highest education level: Not on file  Occupational History  . Not on file  Tobacco Use  . Smoking status: Never Smoker  . Smokeless tobacco: Never Used  Vaping Use  . Vaping Use: Never used  Substance and Sexual Activity  . Alcohol use: Yes    Comment: rarely  . Drug use: Yes    Types: Marijuana    Comment: rare marijuana use only last used 3 months ago  . Sexual activity: Yes  Other Topics Concern  . Not on file  Social History Narrative  . Not on file   Social Determinants of Health   Financial Resource Strain:   . Difficulty of Paying Living Expenses: Not on file  Food Insecurity:   . Worried About Charity fundraiser in the Last Year: Not on file  . Ran Out of Food in the Last Year: Not on file  Transportation Needs:   . Lack of Transportation (Medical): Not on file  . Lack of Transportation (Non-Medical): Not on file  Physical Activity:   . Days of Exercise per Week: Not on file  . Minutes of Exercise per Session: Not on file  Stress:   . Feeling of Stress : Not on file  Social Connections:   . Frequency of Communication with Friends and Family: Not on file  .  Frequency of Social Gatherings with Friends and Family: Not on file  . Attends Religious Services: Not on file  . Active Member of Clubs or Organizations: Not on file  . Attends Archivist Meetings: Not on file  . Marital Status: Not on file   Family History  Problem Relation Age of Onset  . Diabetes Mother   . Hypertension Mother   . Hyperlipidemia Mother   . Cancer Father   . Thyroid disease Sister    Allergies  Allergen Reactions  . Trulicity [Dulaglutide] Other (See Comments)    Weakness - severe  . Metformin And Related Diarrhea    Diarrhea not frequent   Prior to Admission medications   Medication Sig Start Date End Date  Taking? Authorizing Provider  atenolol-chlorthalidone (TENORETIC) 100-25 MG per tablet Take 1 tablet by mouth daily.   Yes [provider]  atorvastatin (LIPITOR) 20 MG tablet Take 20 mg by mouth every evening.    Yes [provider]  busPIRone (BUSPAR) 10 MG tablet Take 10 mg by mouth daily.  07/07/15  Yes [provider]  Canagliflozin-Metformin HCl ER (INVOKAMET XR) 50-500 MG TB24 Take 2 tablets by mouth daily before breakfast. Patient taking differently: Take 1 tablet by mouth in the morning and at bedtime.  07/02/16  Yes Philemon Kingdom, MD  ferrous sulfate 325 (65 FE) MG tablet Take 325 mg by mouth daily.   Yes [provider]  fluticasone (FLONASE) 50 MCG/ACT nasal spray Place 1 spray into both nostrils daily for 14 days. 04/06/20 05/12/21 Yes Avegno, Darrelyn Hillock, FNP  gabapentin (NEURONTIN) 300 MG capsule Take 300 mg by mouth at bedtime.    Yes [provider]  HYDROcodone-acetaminophen (NORCO) 10-325 MG tablet Take 1-2 tablets by mouth daily as needed (pain.).  06/06/20  Yes [provider]  hydrOXYzine (ATARAX/VISTARIL) 25 MG tablet Take 25 mg by mouth at bedtime.  07/04/15  Yes [provider]  lisinopril (PRINIVIL,ZESTRIL) 2.5 MG tablet Take 2.5 mg by mouth every evening.    Yes [provider]  LORazepam (ATIVAN) 0.5 MG tablet Take 0.5 mg by mouth at bedtime.    Yes [provider]  meloxicam (MOBIC) 15 MG tablet Take 15 mg by mouth daily. 06/03/20  Yes [provider]  Multiple Vitamin (MULTIVITAMIN WITH MINERALS) TABS tablet Take 1 tablet by mouth daily. Multivitamin for Adults 50+ (Iron Free)   Yes [provider]  Multiple Vitamins-Minerals (IMMUNE SYSTEM BOOSTER PO) Take 2 tablets by mouth daily. Chewables   Yes [provider]  Omega-3 Fatty Acids (FISH OIL) 1000 MG CAPS Take 1,000 mg by mouth at bedtime.   Yes [provider]  omeprazole (PRILOSEC) 40 MG capsule Take  40 mg by mouth 2 (two) times daily. 05/09/20  Yes [provider]  RYBELSUS 7 MG TABS Take 7 mg by mouth daily.  04/29/20  Yes [provider]  timolol (TIMOPTIC) 0.5 % ophthalmic solution Place 1 drop into the left eye 2 (two) times daily. 05/06/20  Yes [provider]  tiZANidine (ZANAFLEX) 4 MG tablet Take 4 mg by mouth at bedtime. 04/29/20  Yes [provider]  TURMERIC PO Take 2 capsules by mouth daily.   Yes [provider]  zolpidem (AMBIEN) 10 MG tablet Take 10 mg by mouth at bedtime. 05/06/20  Yes [provider]  glucose blood test strip Use to check blood sugar once a day. 07/02/16   Philemon Kingdom, MD  [place]  Use to check blood sugar. 07/02/16   Philemon Kingdom, MD  liraglutide (VICTOZA) 18 MG/3ML SOPN Inject 1.2 mg into the skin every evening.  11/02/19  [provider]     Positive ROS: None  All other systems have been reviewed and were otherwise negative with the exception of those mentioned in the HPI and as above.  Physical Exam: There were no vitals filed for this visit.  General: Alert, no acute distress Cardiovascular: No pedal edema Respiratory: No cyanosis, no use of accessory musculature GI: No organomegaly, abdomen is soft and non-tender Skin: No lesions in the area of chief complaint Neurologic: Sensation intact distally Psychiatric: Patient is competent for consent with normal mood and affect Lymphatic: No axillary or cervical lymphadenopathy  MUSCULOSKELETAL: Left knee: Painful range of motion.  Tender to palpation over medial joint line.  Positive [place].  No instability.  Trace effusion.  Assessment/Plan: LEFT KNEE MEDIAL MENISUS TEAR, MEDIAL COLLATERAL LIGAMENT CYST, BAKERS CYST Plan for Procedure(s): ARTHROSCOPY KNEE WITH POSSIBLE OPEN CYSTECTOMY  The risks benefits and alternatives were discussed with the patient including but not limited to the risks of nonoperative  treatment, versus surgical intervention including infection, bleeding, nerve injury, malunion, nonunion, hardware prominence, hardware failure, need for hardware removal, blood clots, cardiopulmonary complications, morbidity, mortality, among others, and they were willing to proceed.  Predicted outcome is good, although there will be at least a six to nine month expected recovery.  Alta Corning, MD 06/24/2020 10:32 AM

## 2020-06-24 NOTE — Anesthesia Preprocedure Evaluation (Signed)
Anesthesia Evaluation    Reviewed: Allergy & Precautions, Patient's Chart, lab work & pertinent test results  Airway Mallampati: II  TM Distance: >3 FB Neck ROM: Full    Dental no notable dental hx.    Pulmonary sleep apnea and Continuous Positive Airway Pressure Ventilation ,    Pulmonary exam normal breath sounds clear to auscultation       Cardiovascular hypertension, Pt. on home beta blockers and Pt. on medications Normal cardiovascular exam Rhythm:Regular Rate:Normal     Neuro/Psych Anxiety negative neurological ROS     GI/Hepatic GERD  Medicated and Controlled,(+)     substance abuse  ,   Endo/Other  diabetesMorbid obesity  Renal/GU negative Renal ROS     Musculoskeletal  (+) Arthritis ,   Abdominal (+) + obese,   Peds  Hematology HLD   Anesthesia Other Findings LEFT KNEE MEDIAL MENISUS TEAR, MEDIAL COLLATERAL LIGAMENT CYST, BAKERS CYST  Reproductive/Obstetrics                             Anesthesia Physical Anesthesia Plan  ASA: III  Anesthesia Plan: General   Post-op Pain Management:    Induction: Intravenous  PONV Risk Score and Plan: 2 and Ondansetron, Dexamethasone, Midazolam and Treatment may vary due to age or medical condition  Airway Management Planned: LMA  Additional Equipment:   Intra-op Plan:   Post-operative Plan: Extubation in OR  Informed Consent: I have reviewed the patients History and Physical, chart, labs and discussed the procedure including the risks, benefits and alternatives for the proposed anesthesia with the patient or authorized representative who has indicated his/her understanding and acceptance.     Dental advisory given  Plan Discussed with: CRNA  Anesthesia Plan Comments:         Anesthesia Quick Evaluation

## 2020-06-24 NOTE — Anesthesia Procedure Notes (Signed)
Anesthesia Regional Block: Adductor canal block   Pre-Anesthetic Checklist: ,, timeout performed, Correct Patient, Correct Site, Correct Laterality, Correct Procedure,, site marked, risks and benefits discussed, Surgical consent,  Pre-op evaluation,  At surgeon's request and post-op pain management  Laterality: Left  Prep: chloraprep       Needles:  Injection technique: Single-shot  Needle Type: Echogenic Stimulator Needle     Needle Length: 10cm  Needle Gauge: 20     Additional Needles:   Procedures:,,,, ultrasound used (permanent image in chart),,,,  Narrative:  Start time: 06/24/2020 1:00 PM End time: 06/24/2020 1:10 PM Injection made incrementally with aspirations every 5 mL.  Performed by: Personally  Anesthesiologist: Murvin Natal, MD  Additional Notes: Functioning IV was confirmed and monitors were applied. A time-out was performed. Hand hygiene and sterile gloves were used. The thigh was placed in a frog-leg position and prepped in a sterile fashion. A 142mm 20ga BBraun echogenic stimulator needle was placed using ultrasound guidance.  Negative aspiration and negative test dose prior to incremental administration of local anesthetic. The patient tolerated the procedure well.

## 2020-06-24 NOTE — Transfer of Care (Signed)
Immediate Anesthesia Transfer of Care Note  Patient: Scott [name]  Procedure(s) Performed: ARTHROSCOPY KNEE WITH PARTIAL MEDIAL MENISCECTOMY, CHONDROPLASTY,  OPEN CYSTECTOMY (Left Knee)  Patient Location: PACU  Anesthesia Type:General  Level of Consciousness: awake, alert  and oriented  Airway & Oxygen Therapy: Patient Spontanous Breathing and Patient connected to face mask oxygen  Post-op Assessment: Report given to RN and Post -op Vital signs reviewed and stable  Post vital signs: Reviewed and stable  Last Vitals:  Vitals Value Taken Time  BP    Temp    Pulse    Resp    SpO2      Last Pain:  Vitals:   06/24/20 1311  TempSrc:   PainSc: 0-No pain         Complications: No complications documented.

## 2020-06-24 NOTE — Discharge Instructions (Signed)
-  You may bear weight on your surgical leg beginning today. -An Ace bandage was placed over your surgical incisions.  Please keep this in place for the next 2 days.  You may remove the bandage on day 3.  You can cover the small incisions on the front of your knee with Band-Aids. -Bend and straighten her leg several times a day. -Elevate the leg and ice the knee several times a day. -Follow-up in clinic with [place] at [place] and sports medicine and 7 to 10 days.

## 2020-06-26 NOTE — Anesthesia Postprocedure Evaluation (Signed)
Anesthesia Post Note  Patient: Scott [name]  Procedure(s) Performed: ARTHROSCOPY KNEE WITH PARTIAL MEDIAL MENISCECTOMY, CHONDROPLASTY,  OPEN CYSTECTOMY (Left Knee)     Patient location during evaluation: PACU Anesthesia Type: General and Regional Level of consciousness: awake and alert Pain management: pain level controlled Vital Signs Assessment: post-procedure vital signs reviewed and stable Respiratory status: spontaneous breathing, nonlabored ventilation, respiratory function stable and patient connected to nasal cannula oxygen Cardiovascular status: blood pressure returned to baseline and stable Postop Assessment: no apparent nausea or vomiting Anesthetic complications: no   No complications documented.  Last Vitals:  Vitals:   06/24/20 1600 06/24/20 1605  BP: 137/86 135/80  Pulse: 71 77  Resp: 15 16  Temp:  36.4 C  SpO2: 100% 97%    Last Pain:  Vitals:   06/24/20 1605  TempSrc:   PainSc: 0-No pain                 Kateryna Grantham P Merek Niu

## 2020-06-27 ENCOUNTER — Encounter (HOSPITAL_COMMUNITY): Payer: Self-pay | Admitting: Orthopedic Surgery

## 2020-07-04 NOTE — Op Note (Signed)
[name], [name] MEDICAL RECORD [id number] ACCOUNT [id number] DATE OF BIRTH:Mar 18, 1966 FACILITY: [facility] LOCATION: [facility] PHYSICIAN:Cammy Sanjurjo L. Luwanna Brossman, MD  OPERATIVE REPORT  DATE OF PROCEDURE:  06/24/2020  PREOPERATIVE DIAGNOSES:  Medial meniscal tear with a large medial meniscal cyst and large Baker cyst.  POSTOPERATIVE DIAGNOSES: 1.  Medial meniscal tear with a large medial meniscal cyst and large Baker cyst. 2.  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  PRINCIPAL PROCEDURES: 1.  Left knee arthroscopy with partial medial meniscectomy and chondroplasty patellofemoral joint and medial femoral condyle. 2.  Open parameniscal cyst excision along with open Baker cyst excision.  SURGEON:  Dorna Leitz, MD  ASSISTANT:  Harmon Dun, PA-C, who was present for the entire case and assisted by manipulation of the leg, retraction of tissues, and closing to minimize OR time.  BRIEF HISTORY:  The patient is a 54 year old male with a long history of having significant left knee pain. He had been treated conservatively for a prolonged period of time.  After failure of all conservative care, MRI was obtained, which showed a  complex medial meniscal tear as well as a large parameniscal cyst and a separate Baker cyst.  We talked about treatment options and felt that arthroscopy was necessary and felt that open cystectomy would probably give him the best chance of not having  recurrence.  He was brought to the operating room for this procedure.  DESCRIPTION OF PROCEDURE:  The patient was brought to the operating room.  After adequate anesthesia was obtained with a general anesthetic, the patient was placed supine on the operating table.  The left leg was prepped and draped in usual sterile  fashion.  Following this, routine arthroscopic examination of the knee revealed there was an obvious and significant medial meniscal tear.  This was debrided back to a smooth and stable  rim of meniscus.  Attention was turned to the medial femoral  condyle, which had some grade II and grade III chondromalacia, which was debrided.  He had significant grade II and III chondromalacia in the patellofemoral joint as well and this was debrided.  Attention was turned to the ACL, normal.  Attention was  turned to the lateral side, normal.  At this point, attention was turned towards the medial aspect of the leg.  We palpated the MCL and just posteriorly.  A curved incision was made posterior to the MCL.  We dissected down to the level of the joint line and identified the parameniscal cyst,  which was debrided.  We then finger dissected back posteriorly around the knee and was able to identify the Baker cyst.  This was grasped with a Kocher clamp and dissection was carried around the Baker cyst.  Ultimately retracted down to the  posteromedial joint line.  This was cauterized with electrocautery.  At this point, the wound was copiously irrigated and suctioned dry.  The tissue was closed in the posterior aspect of the knee with 0 Vicryl running, 2-0 Vicryl and 3-0 Monocryl  subcuticular.  Benzoin, Steri-Strips, dry sterile compressive dressing was applied and the patient was taken to recovery room, was noted to be in satisfactory condition.  Of note, Harmon Dun was present for the entire case and assisted by manipulation  of the leg, retraction of tissues and closing to minimize OR time.  HN/NUANCE  D:07/04/2020 T:07/04/2020 JOB:[id number]

## 2020-07-15 ENCOUNTER — Ambulatory Visit (HOSPITAL_COMMUNITY): Payer: 59 | Attending: Orthopedic Surgery | Admitting: Physical Therapy

## 2020-07-15 ENCOUNTER — Other Ambulatory Visit: Payer: Self-pay

## 2020-07-15 ENCOUNTER — Encounter (HOSPITAL_COMMUNITY): Payer: Self-pay | Admitting: Physical Therapy

## 2020-07-15 DIAGNOSIS — R262 Difficulty in walking, not elsewhere classified: Secondary | ICD-10-CM | POA: Diagnosis present

## 2020-07-15 DIAGNOSIS — M25562 Pain in left knee: Secondary | ICD-10-CM | POA: Insufficient documentation

## 2020-07-15 NOTE — Therapy (Signed)
[place], [place], [address] Phone: [phone]   Fax:  [phone]  Physical Therapy Evaluation  Patient Details  Name: Scott [name] MRN: [id number] Date of Birth: 08-28-1965 Referring Provider (PT): Dorna Leitz   Encounter Date: 07/15/2020   PT End of Session - 07/15/20 1319    Visit Number 1    Number of Visits 8    Date for PT Re-Evaluation 08/12/20    Authorization Type [employer]- auth required, approved for CPT codes. Visit limit 30 combined PT/OT/Chiro with 19 remaining    Authorization - Visit Number 1    Authorization - Number of Visits 19    Progress Note Due on Visit 10    PT Start Time 3570    PT Stop Time 1352    PT Time Calculation (min) 35 min    Activity Tolerance Patient tolerated treatment well           Past Medical History:  Diagnosis Date  . Anemia   . Anxiety   . Arthritis   . Back pain    pinched nerve sciatica  . Diabetes mellitus without complication ([place])    type  2  . Elevated cholesterol with elevated triglycerides   . Elevated liver enzymes 2014   normal now  . GERD (gastroesophageal reflux disease)   . Hypertension   . Hypertension   . Sleep apnea    uses cpap,setting of 14    Past Surgical History:  Procedure Laterality Date  . CATARACT EXTRACTION Left   . COLONOSCOPY N/A 09/29/2012   Procedure: COLONOSCOPY;  Surgeon: Juanita Craver, MD;  Location: [facility];  Service: Endoscopy;  Laterality: N/A;  . COLONOSCOPY WITH PROPOFOL N/A 06/22/2016   Procedure: COLONOSCOPY WITH PROPOFOL;  Surgeon: Carol Ada, MD;  [facility];  Service: Endoscopy;  Laterality: N/A;  . COLONOSCOPY WITH PROPOFOL N/A 05/20/2020   Procedure: COLONOSCOPY WITH PROPOFOL;  Surgeon: Carol Ada, MD;  [facility];  Service: Endoscopy;  Laterality: N/A;  . ENTEROSCOPY N/A 05/20/2020   Procedure: ENTEROSCOPY;  Surgeon: Carol Ada, MD;  [facility];  Service: Endoscopy;  Laterality: N/A;  . ESOPHAGOGASTRODUODENOSCOPY N/A 09/29/2012   Procedure: ESOPHAGOGASTRODUODENOSCOPY (EGD);  Surgeon: Juanita Craver, MD;  [facility];  Service: Endoscopy;  Laterality: N/A;  . ESOPHAGOGASTRODUODENOSCOPY (EGD) WITH PROPOFOL N/A 06/22/2016   Procedure: ESOPHAGOGASTRODUODENOSCOPY (EGD) WITH PROPOFOL;  Surgeon: Carol Ada, MD;  [facility];  Service: Endoscopy;  Laterality: N/A;  . FRACTURE SURGERY     right wrist with screws  . HERNIA REPAIR  2008   inguinal  . KNEE ARTHROSCOPY Left 06/24/2020   Procedure: ARTHROSCOPY KNEE WITH PARTIAL MEDIAL MENISCECTOMY, CHONDROPLASTY,  OPEN CYSTECTOMY;  Surgeon: Dorna Leitz, MD;  Location: [facility];  Service: Orthopedics;  Laterality: Left;    There were no vitals filed for this visit.    Subjective Assessment - 07/15/20 1331    Subjective Walking with pain and knee stiffness since surgery on 06/24/20.  Reports progressive improvement since surgery and has been able to D/C use of crutches since last week.  Patient works as Geophysicist/field seismologist and maintains busy work schedule and his job requires a lot of walking, bending, squatting.    Currently in Pain? Yes    Pain Score 1     Pain Location Knee    Pain Orientation Left    Pain Descriptors / Indicators Aching    Pain Onset More than a month ago  Pain Frequency Intermittent              [facility] PT Assessment - 07/15/20 0001      Assessment   Medical Diagnosis Left knee scope    Referring Provider (PT) Dorna Leitz      Balance Screen   Has the patient fallen in the past 6 months No    Has the patient had a decrease in activity level because of a fear of falling?  No    Is the patient reluctant to leave their home because of a fear of falling?  No      Observation/Other Assessments   Focus on Therapeutic Outcomes (FOTO)  35% function      ROM / Strength   AROM / PROM / Strength AROM;PROM;Strength      AROM   AROM Assessment  Site Knee    Right/Left Knee Right;Left      PROM   PROM Assessment Site Knee    Right/Left Knee Right;Left    Right Knee Extension 0    Left Knee Extension 5   lacking   Left Knee Flexion 110      Strength   Strength Assessment Site Knee;Hip    Right/Left Hip Left   not tested due to pain     Ambulation/Gait   Ambulation/Gait Yes    Ambulation/Gait Assistance 5: Supervision    Ambulation Distance (Feet) 226 Feet    Assistive device Straight cane    Gait Pattern Antalgic    Ambulation Surface Level    Gait velocity decreased    Gait Comments 2MWT                      Objective measurements completed on examination: See above findings.               PT Education - 07/15/20 1341    Education Details pt educated in HEP activities and exam/evaluation findings.  Instructed in correct use of [place] for ambulation    Person(s) Educated Patient    Methods Explanation;Demonstration    Comprehension Verbalized understanding;Returned demonstration            PT Short Term Goals - 07/15/20 1400      PT SHORT TERM GOAL #1   Title Pt will perform HEP correctly and daily to improve tolerance to activity and improve overall QoL.    Baseline iniatited at eval    Time 2    Period Weeks    Status New    Target Date 07/29/20      PT SHORT TERM GOAL #2   Title Patient will demonstrate knee Left flexion ROM to 120 degrees and full extension to perform symmetric sit to stand    Baseline 110 flexion, lacking 5 degrees exetension    Time 2    Period Weeks    Status New    Target Date 07/29/20      PT SHORT TERM GOAL #3   Title Patient will ambulate indepedently with normalized gait pattern x 500 ft during 2MWT to improve community level ambulation    Baseline antalgic x 226 ft with [place]    Time 2    Period Weeks    Status New    Target Date 07/29/20      PT SHORT TERM GOAL #4   Title Patient will improve FOTO score by at least 5 points in order to indicate  improved tolerance to activity.    Baseline 35% function  Time 2    Period Weeks    Status New    Target Date 07/29/20             PT Long Term Goals - 07/15/20 1403      PT LONG TERM GOAL #1   Title Pt will improve FOTO score to 75% functional to indicate significant improvement in self-perceived limitations, thus improving overall QoL.    Baseline 35%    Time 4    Period Weeks    Status New    Target Date 08/12/20      PT LONG TERM GOAL #2   Title Patient will demo independence in complex HEP activites in order to self-progress PRE program    Baseline initiated at eval    Time 4    Period Weeks    Status New    Target Date 08/12/20      PT LONG TERM GOAL #3   Title Patient will ambulate indepedently with normalized gait pattern x 750 ft during 2MWT to improve community level ambulation    Time 4    Period Weeks    Status New    Target Date 08/12/20                  Plan - 07/15/20 1346    Clinical Impression Statement Patient exhibits Left knee ROM and strength deficits with limited activity tolerance and ability to perform job and home duties since recent surgery due to weakness and gait deviations. Patient would benefit from PT services to restore left knee ROM and improve strength to enable ambulation without deviation or use of AD and increase functional activity tolerance for restoration to PLOF and independent ambulation    Personal Factors and Comorbidities Comorbidity 1;Comorbidity 2    Comorbidities obesity, DM    Examination-Activity Limitations Bend;Carry;Lift;Locomotion Level;Squat;Stairs;Stand;Transfers    Examination-Participation Restrictions Laundry;Yard Work;Shop;Occupation    Stability/Clinical Decision Making Stable/Uncomplicated    Clinical Decision Making Low    Rehab Potential Excellent    PT Frequency 2x / week    PT Duration 4 weeks    PT Treatment/Interventions Biofeedback;Cryotherapy;Electrical Stimulation;DME  Instruction;Ultrasound;Gait training;Stair training;Functional mobility training;Therapeutic activities;Therapeutic exercise;Balance training;Patient/family education;Neuromuscular re-education;Manual techniques;Passive range of motion;Taping;Joint Manipulations    PT Next Visit Plan continue with POC for Left knee ROM and strength    PT Home Exercise Plan heel slides, quad set    Consulted and Agree with Plan of Care Patient           Patient will benefit from skilled therapeutic intervention in order to improve the following deficits and impairments:  Abnormal gait,Decreased endurance,Decreased activity tolerance,Decreased balance,Decreased mobility,Decreased knowledge of use of DME,Decreased strength,Increased edema,Difficulty walking  Visit Diagnosis: Acute pain of left knee - Plan: PT plan of care cert/re-cert  Difficulty in walking, not elsewhere classified - Plan: PT plan of care cert/re-cert     Problem List Patient Active Problem List   Diagnosis Date Noted  . Palpitations 02/21/2016    2:21 PM, 07/15/20 M. Sherlyn Lees, PT, DPT Physical Therapist- [place] Office Number: [phone]  [facility] [address] [place], [place], [address] Phone: [phone]   Fax:  [phone]  Name: Scott [name] MRN: [id number] Date of Birth: [birth year]

## 2020-07-19 ENCOUNTER — Other Ambulatory Visit: Payer: Self-pay

## 2020-07-19 ENCOUNTER — Ambulatory Visit (HOSPITAL_COMMUNITY): Payer: 59 | Admitting: Physical Therapy

## 2020-07-19 ENCOUNTER — Encounter (HOSPITAL_COMMUNITY): Payer: Self-pay | Admitting: Physical Therapy

## 2020-07-19 DIAGNOSIS — M25562 Pain in left knee: Secondary | ICD-10-CM

## 2020-07-19 DIAGNOSIS — R262 Difficulty in walking, not elsewhere classified: Secondary | ICD-10-CM

## 2020-07-19 NOTE — Therapy (Signed)
Scott [name], [place], [address] Phone: [phone]   Fax:  [phone]  Physical Therapy Treatment  Patient Details  Name: Scott [name] MRN: [id number] Date of Birth: October 07, 1965 Referring Provider (PT): Dorna Leitz   Encounter Date: 07/19/2020   PT End of Session - 07/19/20 1533    Visit Number 2    Number of Visits 8    Date for PT Re-Evaluation 08/12/20    Authorization Type [employer]- auth required, approved for CPT codes. Visit limit 30 combined PT/OT/Chiro with 19 remaining    Authorization - Visit Number 2    Authorization - Number of Visits 19    Progress Note Due on Visit 10    PT Start Time 5625    PT Stop Time 1612    PT Time Calculation (min) 39 min    Activity Tolerance Patient tolerated treatment well    Behavior During Therapy WFL for tasks assessed/performed           Past Medical History:  Diagnosis Date  . Anemia   . Anxiety   . Arthritis   . Back pain    pinched nerve sciatica  . Diabetes mellitus without complication ([place])    type  2  . Elevated cholesterol with elevated triglycerides   . Elevated liver enzymes 2014   normal now  . GERD (gastroesophageal reflux disease)   . Hypertension   . Hypertension   . Sleep apnea    uses cpap,setting of 14    Past Surgical History:  Procedure Laterality Date  . CATARACT EXTRACTION Left   . COLONOSCOPY N/A 09/29/2012   Procedure: COLONOSCOPY;  Surgeon: Juanita Craver, MD;  [facility];  Service: Endoscopy;  Laterality: N/A;  . COLONOSCOPY WITH PROPOFOL N/A 06/22/2016   Procedure: COLONOSCOPY WITH PROPOFOL;  Surgeon: Carol Ada, MD;  [facility];  Service: Endoscopy;  Laterality: N/A;  . COLONOSCOPY WITH PROPOFOL N/A 05/20/2020   Procedure: COLONOSCOPY WITH PROPOFOL;  Surgeon: Carol Ada, MD;  [facility];  Service: Endoscopy;  Laterality: N/A;  . ENTEROSCOPY N/A 05/20/2020   Procedure:  ENTEROSCOPY;  Surgeon: Carol Ada, MD;  [facility];  Service: Endoscopy;  Laterality: N/A;  . ESOPHAGOGASTRODUODENOSCOPY N/A 09/29/2012   Procedure: ESOPHAGOGASTRODUODENOSCOPY (EGD);  Surgeon: Juanita Craver, MD;  [facility];  Service: Endoscopy;  Laterality: N/A;  . ESOPHAGOGASTRODUODENOSCOPY (EGD) WITH PROPOFOL N/A 06/22/2016   Procedure: ESOPHAGOGASTRODUODENOSCOPY (EGD) WITH PROPOFOL;  Surgeon: Carol Ada, MD;  [facility];  Service: Endoscopy;  Laterality: N/A;  . FRACTURE SURGERY     right wrist with screws  . HERNIA REPAIR  2008   inguinal  . KNEE ARTHROSCOPY Left 06/24/2020   Procedure: ARTHROSCOPY KNEE WITH PARTIAL MEDIAL MENISCECTOMY, CHONDROPLASTY,  OPEN CYSTECTOMY;  Surgeon: Dorna Leitz, MD;  Location: [facility];  Service: Orthopedics;  Laterality: Left;    There were no vitals filed for this visit.   Subjective Assessment - 07/19/20 1534    Subjective Patient states he has been walking without the cane. His knee feels that it is getting better every day.    Currently in Pain? Yes    Pain Score 1     Pain Location Knee    Pain Orientation Left    Pain Descriptors / Indicators Other (Comment)   stiffness   Pain Onset More than a month ago  [place] Adult PT Treatment/Exercise - 07/19/20 0001      Exercises   Exercises Knee/Hip      Knee/Hip Exercises: Aerobic   Recumbent Bike 5 min with cueing for rocking initally for ROM and stiffness      Knee/Hip Exercises: Standing   Heel Raises Both;2 sets;15 reps    Knee Flexion Both;1 set;20 reps    Lateral Step Up Left;2 sets;10 reps;Hand Hold: 2;Step Height: 4"    Lateral Step Up Limitations eccentric control    Forward Step Up Both;2 sets;10 reps;Hand Hold: 1;Step Height: 6"      Knee/Hip Exercises: Seated   Sit to Sand 1 set;10 reps      Knee/Hip Exercises: Supine   Straight Leg Raises Left;1 set;10 reps    Straight Leg Raises Limitations  with quad set    Knee Extension AROM    Knee Extension Limitations 0    Knee Flexion AROM    Knee Flexion Limitations 130                  PT Education - 07/19/20 1539    Education Details Patient educated on exercise mechanics, HEP    Person(s) Educated Patient    Methods Explanation;Demonstration    Comprehension Verbalized understanding;Returned demonstration            PT Short Term Goals - 07/15/20 1400      PT SHORT TERM GOAL #1   Title Pt will perform HEP correctly and daily to improve tolerance to activity and improve overall QoL.    Baseline iniatited at eval    Time 2    Period Weeks    Status New    Target Date 07/29/20      PT SHORT TERM GOAL #2   Title Patient will demonstrate knee Left flexion ROM to 120 degrees and full extension to perform symmetric sit to stand    Baseline 110 flexion, lacking 5 degrees exetension    Time 2    Period Weeks    Status New    Target Date 07/29/20      PT SHORT TERM GOAL #3   Title Patient will ambulate indepedently with normalized gait pattern x 500 ft during 2MWT to improve community level ambulation    Baseline antalgic x 226 ft with [place]    Time 2    Period Weeks    Status New    Target Date 07/29/20      PT SHORT TERM GOAL #4   Title Patient will improve FOTO score by at least 5 points in order to indicate improved tolerance to activity.    Baseline 35% function    Time 2    Period Weeks    Status New    Target Date 07/29/20             PT Long Term Goals - 07/15/20 1403      PT LONG TERM GOAL #1   Title Pt will improve FOTO score to 75% functional to indicate significant improvement in self-perceived limitations, thus improving overall QoL.    Baseline 35%    Time 4    Period Weeks    Status New    Target Date 08/12/20      PT LONG TERM GOAL #2   Title Patient will demo independence in complex HEP activites in order to self-progress PRE program    Baseline initiated at eval    Time 4     Period Weeks  Status New    Target Date 08/12/20      PT LONG TERM GOAL #3   Title Patient will ambulate indepedently with normalized gait pattern x 750 ft during 2MWT to improve community level ambulation    Time 4    Period Weeks    Status New    Target Date 08/12/20                 Plan - 07/19/20 1533    Clinical Impression Statement Patient showing improved ROM from 0-130 degrees today following bike. Patient given cueing for prior quad contraction with SLR without quad lag but c/o difficulty. Patient requires cueing for controlled decent of stair exercises for improving eccentric strength and motor control. Patient requires bilateral UE support for lateral step down exercise. Patient completes 1 set of sit to stand with fatigue noted following. Patient will continue to benefit from skilled physical therapy in order to reduce impairment and improve function.    Personal Factors and Comorbidities Comorbidity 1;Comorbidity 2    Comorbidities obesity, DM    Examination-Activity Limitations Bend;Carry;Lift;Locomotion Level;Squat;Stairs;Stand;Transfers    Examination-Participation Restrictions Laundry;Yard Work;Shop;Occupation    Stability/Clinical Decision Making Stable/Uncomplicated    Rehab Potential Excellent    PT Frequency 2x / week    PT Duration 4 weeks    PT Treatment/Interventions Biofeedback;Cryotherapy;Electrical Stimulation;DME Instruction;Ultrasound;Gait training;Stair training;Functional mobility training;Therapeutic activities;Therapeutic exercise;Balance training;Patient/family education;Neuromuscular re-education;Manual techniques;Passive range of motion;Taping;Joint Manipulations    PT Next Visit Plan continue with POC for Left knee strength    PT Home Exercise Plan heel slides, quad set 12/14 STS    Consulted and Agree with Plan of Care Patient           Patient will benefit from skilled therapeutic intervention in order to improve the following deficits  and impairments:  Abnormal gait,Decreased endurance,Decreased activity tolerance,Decreased balance,Decreased mobility,Decreased knowledge of use of DME,Decreased strength,Increased edema,Difficulty walking  Visit Diagnosis: Acute pain of left knee  Difficulty in walking, not elsewhere classified     Problem List Patient Active Problem List   Diagnosis Date Noted  . Palpitations 02/21/2016    4:15 PM, 07/19/20 Mearl Latin PT, DPT Physical Therapist at [place], [place], [address] Phone: [phone]   Fax:  [phone]  Name: Scott [name] MRN: [id number] Date of Birth: [birth year]

## 2020-07-19 NOTE — Patient Instructions (Signed)
Access Code: [email or web address] URL: [email or web address] Date: 07/19/2020 Prepared by: Mitzi Hansen Louise Victory  Exercises Sit to Stand with Arms Crossed - 1 x daily - 7 x weekly - 1 sets - 10 reps

## 2020-07-21 ENCOUNTER — Ambulatory Visit (HOSPITAL_COMMUNITY): Payer: 59 | Admitting: Physical Therapy

## 2020-07-26 ENCOUNTER — Ambulatory Visit (HOSPITAL_COMMUNITY): Payer: 59 | Admitting: Physical Therapy

## 2020-07-26 ENCOUNTER — Telehealth (HOSPITAL_COMMUNITY): Payer: Self-pay | Admitting: Physical Therapy
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
## 2020-08-02 ENCOUNTER — Encounter (HOSPITAL_COMMUNITY): Payer: Self-pay | Admitting: Physical Therapy

## 2020-08-02 ENCOUNTER — Ambulatory Visit (HOSPITAL_COMMUNITY): Payer: 59 | Admitting: Physical Therapy

## 2020-08-02 ENCOUNTER — Other Ambulatory Visit: Payer: Self-pay

## 2020-08-02 DIAGNOSIS — M25562 Pain in left knee: Secondary | ICD-10-CM

## 2020-08-02 DIAGNOSIS — R262 Difficulty in walking, not elsewhere classified: Secondary | ICD-10-CM
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
## 2020-08-04 ENCOUNTER — Ambulatory Visit (HOSPITAL_COMMUNITY): Payer: 59 | Admitting: Physical Therapy

## 2020-08-09 ENCOUNTER — Telehealth (HOSPITAL_COMMUNITY): Payer: Self-pay

## 2020-08-09 ENCOUNTER — Ambulatory Visit (HOSPITAL_COMMUNITY): Payer: 59

## 2020-08-09 NOTE — Telephone Encounter (Signed)
pt cancelled appt for today because the kids are home from school

## 2020-08-10 ENCOUNTER — Encounter (HOSPITAL_COMMUNITY): Payer: Self-pay | Admitting: Physical Therapy

## 2020-08-10 ENCOUNTER — Other Ambulatory Visit: Payer: Self-pay

## 2020-08-10 ENCOUNTER — Ambulatory Visit (HOSPITAL_COMMUNITY): Payer: 59 | Attending: Orthopedic Surgery | Admitting: Physical Therapy

## 2020-08-10 DIAGNOSIS — M25562 Pain in left knee: Secondary | ICD-10-CM | POA: Diagnosis present

## 2020-08-10 DIAGNOSIS — R262 Difficulty in walking, not elsewhere classified: Secondary | ICD-10-CM | POA: Diagnosis present

## 2020-08-10 NOTE — Therapy (Signed)
[place], [place], [address] Phone: [phone]   Fax:  [phone]  Physical Therapy Treatment/ Discharge Summary  Patient Details  Name: Scott [name] MRN: [id number] Date of Birth: 1966-07-31 Referring Provider (PT): Dorna Leitz   Encounter Date: 08/10/2020  PHYSICAL THERAPY DISCHARGE SUMMARY  Visits from Start of Care: 4  Current functional level related to goals / functional outcomes: See below   Remaining deficits: See below   Education / Equipment: See below  Plan: Patient agrees to discharge.  Patient goals were partially met. Patient is being discharged due to meeting the stated rehab goals.  ?????        PT End of Session - 08/10/20 1143    Visit Number 4    Number of Visits 8    Date for PT Re-Evaluation 08/12/20    Authorization Type [employer]- auth required, approved for CPT codes. Visit limit 30 combined PT/OT/Chiro with 19 remaining    Authorization - Visit Number 4    Authorization - Number of Visits 19    Progress Note Due on Visit 10    PT Start Time 1140    PT Stop Time 1207    PT Time Calculation (min) 27 min    Activity Tolerance Patient tolerated treatment well    Behavior During Therapy WFL for tasks assessed/performed           Past Medical History:  Diagnosis Date  . Anemia   . Anxiety   . Arthritis   . Back pain    pinched nerve sciatica  . Diabetes mellitus without complication ([place])    type  2  . Elevated cholesterol with elevated triglycerides   . Elevated liver enzymes 2014   normal now  . GERD (gastroesophageal reflux disease)   . Hypertension   . Hypertension   . Sleep apnea    uses cpap,setting of 14    Past Surgical History:  Procedure Laterality Date  . CATARACT EXTRACTION Left   . COLONOSCOPY N/A 09/29/2012   Procedure: COLONOSCOPY;  Surgeon: Juanita Craver, MD;  [facility]: [facility];  Service: Endoscopy;  Laterality: N/A;  .  COLONOSCOPY WITH PROPOFOL N/A 06/22/2016   Procedure: COLONOSCOPY WITH PROPOFOL;  Surgeon: Carol Ada, MD;  [facility]: [facility];  Service: Endoscopy;  Laterality: N/A;  . COLONOSCOPY WITH PROPOFOL N/A 05/20/2020   Procedure: COLONOSCOPY WITH PROPOFOL;  Surgeon: Carol Ada, MD;  [facility];  Service: Endoscopy;  Laterality: N/A;  . ENTEROSCOPY N/A 05/20/2020   Procedure: ENTEROSCOPY;  Surgeon: Carol Ada, MD;  [facility];  Service: Endoscopy;  Laterality: N/A;  . ESOPHAGOGASTRODUODENOSCOPY N/A 09/29/2012   Procedure: ESOPHAGOGASTRODUODENOSCOPY (EGD);  Surgeon: Juanita Craver, MD;  [facility];  Service: Endoscopy;  Laterality: N/A;  . ESOPHAGOGASTRODUODENOSCOPY (EGD) WITH PROPOFOL N/A 06/22/2016   Procedure: ESOPHAGOGASTRODUODENOSCOPY (EGD) WITH PROPOFOL;  Surgeon: Carol Ada, MD;  [facility];  Service: Endoscopy;  Laterality: N/A;  . FRACTURE SURGERY     right wrist with screws  . HERNIA REPAIR  2008   inguinal  . KNEE ARTHROSCOPY Left 06/24/2020   Procedure: ARTHROSCOPY KNEE WITH PARTIAL MEDIAL MENISCECTOMY, CHONDROPLASTY,  OPEN CYSTECTOMY;  Surgeon: Dorna Leitz, MD;  Location: [facility];  Service: Orthopedics;  Laterality: Left;    There were no vitals filed for this visit.   Subjective Assessment - 08/10/20 1144    Subjective Patient states he continues to have intermittent pain. The therapy has helped him. He follows up  with MD in about a month. His walking is alot better. His exercises are going well and he is doing them 1x/day. Swelling has gone down. He states 90% improvement with PT intervention.    Currently in Pain? No/denies    Pain Onset More than a month ago              [facility] PT Assessment - 08/10/20 0001      Assessment   Medical Diagnosis Left knee scope    Referring Provider (PT) Dorna Leitz    Next MD Visit 3 weeks      Precautions   Precautions None      Restrictions   Weight Bearing Restrictions No       Balance Screen   Has the patient fallen in the past 6 months No    Has the patient had a decrease in activity level because of a fear of falling?  No    Is the patient reluctant to leave their home because of a fear of falling?  No      Prior Function   Level of Independence Independent      Observation/Other Assessments   Focus on Therapeutic Outcomes (FOTO)  65% limited      PROM   Left Knee Extension 0    Left Knee Flexion 130      Strength   Right/Left Knee Left    Left Knee Flexion 5/5    Left Knee Extension 5/5      Ambulation/Gait   Ambulation/Gait Yes    Ambulation/Gait Assistance 7: Independent    Ambulation Distance (Feet) 450 Feet    Assistive device None    Gait Pattern Antalgic   very slight   Ambulation Surface Level;Indoor    Gait Comments 2MWT                         [facility] Adult PT Treatment/Exercise - 08/10/20 0001      Knee/Hip Exercises: Aerobic   Recumbent Bike 5 min for dyanmic warm up                  PT Education - 08/10/20 1144    Education Details Patient educated on exercise mechanics, HEP, progress made, returning to physical therapy if needed    Person(s) Educated Patient    Methods Explanation;Demonstration;Handout    Comprehension Verbalized understanding;Returned demonstration            PT Short Term Goals - 08/10/20 1157      PT SHORT TERM GOAL #1   Title Pt will perform HEP correctly and daily to improve tolerance to activity and improve overall QoL.    Baseline iniatited at eval    Time 2    Period Weeks    Status Achieved    Target Date 07/29/20      PT SHORT TERM GOAL #2   Title Patient will demonstrate knee Left flexion ROM to 120 degrees and full extension to perform symmetric sit to stand    Baseline 0-134    Time 2    Period Weeks    Status Achieved    Target Date 07/29/20      PT SHORT TERM GOAL #3   Title Patient will ambulate indepedently with normalized gait pattern x 500 ft during  2MWT to improve community level ambulation    Baseline antalgic x 226 ft with [place]    Time 2    Period Weeks  Status Not Met    Target Date 07/29/20      PT SHORT TERM GOAL #4   Title Patient will improve FOTO score by at least 5 points in order to indicate improved tolerance to activity.    Baseline 35% function    Time 2    Period Weeks    Status Achieved    Target Date 07/29/20             PT Long Term Goals - 08/10/20 1158      PT LONG TERM GOAL #1   Title Pt will improve FOTO score to 75% functional to indicate significant improvement in self-perceived limitations, thus improving overall QoL.    Baseline 65%    Time 4    Period Weeks    Status Achieved      PT LONG TERM GOAL #2   Title Patient will demo independence in complex HEP activites in order to self-progress PRE program    Baseline initiated at eval    Time 4    Period Weeks    Status Achieved      PT LONG TERM GOAL #3   Title Patient will ambulate indepedently with normalized gait pattern x 750 ft during 2MWT to improve community level ambulation    Time 4    Period Weeks    Status Not Met                 Plan - 08/10/20 1143    Clinical Impression Statement Patient has met 3/4 short term goals and 2/3 long term goals with ability to complete HEP and improved symptoms, ROM, gait, activity tolerance, and functional mobility. Patient remains limited by minimally antalgic gait despite no pain while ambulating. Patient showing good overall strength but continues to lack eccentric strength. Patient educated on continuing HEP to improve strength and returning to physical therapy if needed. Patient discharged from physical therapy at this time.    Personal Factors and Comorbidities Comorbidity 1;Comorbidity 2    Comorbidities obesity, DM    Examination-Activity Limitations Bend;Carry;Lift;Locomotion Level;Squat;Stairs;Stand;Transfers    Examination-Participation Restrictions Laundry;Yard  Work;Shop;Occupation    Stability/Clinical Decision Making Stable/Uncomplicated    Rehab Potential Excellent    PT Frequency --    PT Duration --    PT Treatment/Interventions Biofeedback;Cryotherapy;Electrical Stimulation;DME Instruction;Ultrasound;Gait training;Stair training;Functional mobility training;Therapeutic activities;Therapeutic exercise;Balance training;Patient/family education;Neuromuscular re-education;Manual techniques;Passive range of motion;Taping;Joint Manipulations    PT Next Visit Plan n/a    PT Home Exercise Plan heel slides, quad set 12/14 STS 1/5 HR, step up, lateral step down, squat, lateral stepping    Consulted and Agree with Plan of Care Patient           Patient will benefit from skilled therapeutic intervention in order to improve the following deficits and impairments:  Abnormal gait,Decreased endurance,Decreased activity tolerance,Decreased balance,Decreased mobility,Decreased knowledge of use of DME,Decreased strength,Increased edema,Difficulty walking  Visit Diagnosis: Acute pain of left knee  Difficulty in walking, not elsewhere classified     Problem List Patient Active Problem List   Diagnosis Date Noted  . Palpitations 02/21/2016   12:16 PM, 08/10/20 Mearl Latin PT, DPT Physical Therapist at [place], [place], [address] Phone: [phone]   Fax:  [phone]  Name: Peder Allums MRN: [id number] Date of Birth: [birth year]

## 2020-08-10 NOTE — Patient Instructions (Signed)
Access Code: [email or web address] URL: [email or web address] Date: 08/10/2020 Prepared by: Greig Castilla Ayaansh Smail  Exercises Heel rises with counter support - 1 x daily - 7 x weekly - 1 sets - 20 reps Step Up - 1 x daily - 7 x weekly - 2 sets - 10 reps Lateral Step Down - 1 x daily - 7 x weekly - 2 sets - 10 reps Sidestepping - 1 x daily - 7 x weekly - 3 sets - 10 reps Squat with Counter Support - 1 x daily - 7 x weekly - 2 sets - 10 reps

## 2020-08-11 ENCOUNTER — Ambulatory Visit (HOSPITAL_COMMUNITY): Payer: 59 | Admitting: Physical Therapy

## 2020-08-16 ENCOUNTER — Encounter (HOSPITAL_COMMUNITY): Payer: 59

## 2020-08-18 ENCOUNTER — Encounter (HOSPITAL_COMMUNITY): Payer: 59

## 2020-10-09 ENCOUNTER — Encounter: Payer: Self-pay | Admitting: Emergency Medicine

## 2020-10-09 ENCOUNTER — Ambulatory Visit
Admission: EM | Admit: 2020-10-09 | Discharge: 2020-10-09 | Disposition: A | Discharge status: Home / Self Care | Payer: 59

## 2020-10-09 ENCOUNTER — Other Ambulatory Visit: Payer: Self-pay

## 2020-10-09 DIAGNOSIS — R059 Cough, unspecified: Secondary | ICD-10-CM

## 2020-10-09 DIAGNOSIS — Z20822 Contact with and (suspected) exposure to covid-19: Secondary | ICD-10-CM

## 2020-10-09 DIAGNOSIS — J0140 Acute pansinusitis, unspecified: Secondary | ICD-10-CM

## 2020-10-09 MED ORDER — AMOXICILLIN 875 MG PO TABS: 875.0000 mg | ORAL_TABLET | Freq: Two times a day (BID) | ORAL | 0 refills | Status: AC

## 2020-10-09 MED ORDER — PROMETHAZINE-DM 6.25-15 MG/5ML PO SYRP
5.0000 mL | ORAL_SOLUTION | Freq: Four times a day (QID) | ORAL | 0 refills | Status: DC | PRN
Start: 2020-10-09 — End: 2022-04-13

## 2020-10-09 MED ORDER — PREDNISONE 20 MG PO TABS: 40.0000 mg | ORAL_TABLET | Freq: Every day | ORAL | 0 refills | Status: AC

## 2020-10-09 NOTE — ED Provider Notes (Addendum)
[facility]    CSN: [id number] Arrival date & time: 10/09/20  7353      History   Chief Complaint No chief complaint on file.   HPI Scott [name] is a 55 y.o. male.   HPI  Patient history of type 2 diabetes presents today with 3 days of nasal congestion, generalized body aches, sore throat,and productive cough.  He reports that his wife is recently been sick with pneumonia although not recently diagnosed with Covid.  He denies fever although endorses body aches.  He also endorses headache, bloody discharge drainage from his nose and feeling unwell.  Attempted home management of symptoms without any improvement.  Denies any shortness of breath. Past Medical History:  Diagnosis Date  . Anemia   . Anxiety   . Arthritis   . Back pain    pinched nerve sciatica  . Diabetes mellitus without complication ([place])    type  2  . Elevated cholesterol with elevated triglycerides   . Elevated liver enzymes 2014   normal now  . GERD (gastroesophageal reflux disease)   . Hypertension   . Hypertension   . Sleep apnea    uses cpap,setting of 14    Patient Active Problem List   Diagnosis Date Noted  . Palpitations 02/21/2016    Past Surgical History:  Procedure Laterality Date  . CATARACT EXTRACTION Left   . COLONOSCOPY N/A 09/29/2012   Procedure: COLONOSCOPY;  Surgeon: Juanita Craver, MD;  Location: [facility];  Service: Endoscopy;  Laterality: N/A;  . COLONOSCOPY WITH PROPOFOL N/A 06/22/2016   Procedure: COLONOSCOPY WITH PROPOFOL;  Surgeon: Carol Ada, MD;  Location: [facility];  Service: Endoscopy;  Laterality: N/A;  . COLONOSCOPY WITH PROPOFOL N/A 05/20/2020   Procedure: COLONOSCOPY WITH PROPOFOL;  Surgeon: Carol Ada, MD;  [facility];  Service: Endoscopy;  Laterality: N/A;  . ENTEROSCOPY N/A 05/20/2020   Procedure: ENTEROSCOPY;  Surgeon: Carol Ada, MD;  [facility];  Service: Endoscopy;  Laterality: N/A;  .  ESOPHAGOGASTRODUODENOSCOPY N/A 09/29/2012   Procedure: ESOPHAGOGASTRODUODENOSCOPY (EGD);  Surgeon: Juanita Craver, MD;  [facility];  Service: Endoscopy;  Laterality: N/A;  . ESOPHAGOGASTRODUODENOSCOPY (EGD) WITH PROPOFOL N/A 06/22/2016   Procedure: ESOPHAGOGASTRODUODENOSCOPY (EGD) WITH PROPOFOL;  Surgeon: Carol Ada, MD;  [facility];  Service: Endoscopy;  Laterality: N/A;  . FRACTURE SURGERY     right wrist with screws  . HERNIA REPAIR  2008   inguinal  . KNEE ARTHROSCOPY Left 06/24/2020   Procedure: ARTHROSCOPY KNEE WITH PARTIAL MEDIAL MENISCECTOMY, CHONDROPLASTY,  OPEN CYSTECTOMY;  Surgeon: Dorna Leitz, MD;  Location: [facility];  Service: Orthopedics;  Laterality: Left;       Home Medications    Prior to Admission medications   Medication Sig Start Date End Date Taking? Authorizing Provider  rifaximin (XIFAXAN) 200 MG tablet Take 200 mg by mouth 3 (three) times daily.   Yes [provider]  atenolol-chlorthalidone (TENORETIC) 100-25 MG per tablet Take 1 tablet by mouth daily.    [provider]  atorvastatin (LIPITOR) 20 MG tablet Take 20 mg by mouth every evening.     [provider]  busPIRone (BUSPAR) 10 MG tablet Take 10 mg by mouth daily.  07/07/15   [provider]  Canagliflozin-Metformin HCl ER (INVOKAMET XR) 50-500 MG TB24 Take 2 tablets by mouth daily before breakfast. Patient taking differently: Take 1 tablet by mouth in the morning and at bedtime.  07/02/16   Philemon Kingdom, MD  docusate sodium (COLACE) 100  MG capsule Take 1 capsule (100 mg total) by mouth 2 (two) times daily. 06/24/20 06/24/21  Dereck Leep, PA-C  ferrous sulfate 325 (65 FE) MG tablet Take 325 mg by mouth daily.    [provider]  fluticasone (FLONASE) 50 MCG/ACT nasal spray Place 1 spray into both nostrils daily for 14 days. 04/06/20 05/12/21  Avegno, Darrelyn Hillock, FNP  gabapentin (NEURONTIN) 300 MG capsule Take 300 mg by mouth at bedtime.      [provider]  glucose blood test strip Use to check blood sugar once a day. 07/02/16   Philemon Kingdom, MD  HYDROcodone-acetaminophen (NORCO) 10-325 MG tablet Take 1-2 tablets by mouth daily as needed (pain.).  06/06/20   [provider]  hydrOXYzine (ATARAX/VISTARIL) 25 MG tablet Take 25 mg by mouth at bedtime.  07/04/15   [provider]  lisinopril (PRINIVIL,ZESTRIL) 2.5 MG tablet Take 2.5 mg by mouth every evening.     [provider]  LORazepam (ATIVAN) 0.5 MG tablet Take 0.5 mg by mouth at bedtime.     [provider]  meloxicam (MOBIC) 15 MG tablet Take 15 mg by mouth daily. 06/03/20   [provider]  MICROLET LANCETS MISC Use to check blood sugar. 07/02/16   Philemon Kingdom, MD  Multiple Vitamin (MULTIVITAMIN WITH MINERALS) TABS tablet Take 1 tablet by mouth daily. Multivitamin for Adults 50+ (Iron Free)    [provider]  Multiple Vitamins-Minerals (IMMUNE SYSTEM BOOSTER PO) Take 2 tablets by mouth daily. Chewables    [provider]  Omega-3 Fatty Acids (FISH OIL) 1000 MG CAPS Take 1,000 mg by mouth at bedtime.    [provider]  omeprazole (PRILOSEC) 40 MG capsule Take 40 mg by mouth 2 (two) times daily. 05/09/20   [provider]  oxyCODONE (ROXICODONE) 5 MG immediate release tablet Take 1 tablet (5 mg total) by mouth every 6 (six) hours as needed (for pain). 06/24/20 06/24/21  Dereck Leep, PA-C  RYBELSUS 7 MG TABS Take 7 mg by mouth daily.  04/29/20   [provider]  timolol (TIMOPTIC) 0.5 % ophthalmic solution Place 1 drop into the left eye 2 (two) times daily. 05/06/20   [provider]  tiZANidine (ZANAFLEX) 4 MG tablet Take 4 mg by mouth at bedtime. 04/29/20   [provider]  TURMERIC PO Take 2 capsules by mouth daily.    [provider]  zolpidem (AMBIEN) 10 MG tablet Take 10 mg by mouth at bedtime. 05/06/20   [provider]   liraglutide (VICTOZA) 18 MG/3ML SOPN Inject 1.2 mg into the skin every evening.  11/02/19  [provider]    Family History Family History  Problem Relation Age of Onset  . Diabetes Mother   . Hypertension Mother   . Hyperlipidemia Mother   . Cancer Father   . Thyroid disease Sister     Social History Social History   Tobacco Use  . Smoking status: Never Smoker  . Smokeless tobacco: Never Used  Vaping Use  . Vaping Use: Never used  Substance Use Topics  . Alcohol use: Yes    Comment: rarely  . Drug use: Yes    Types: Marijuana    Comment: rare marijuana use only last used 3 months ago     Allergies   Trulicity [dulaglutide] and Metformin and related   Review of Systems Review of Systems Pertinent negatives listed in HPI   Physical Exam Triage Vital Signs ED Triage Vitals  Enc Vitals Group     BP 10/09/20 0857 118/77     Pulse Rate 10/09/20 0857 88     Resp 10/09/20 0857 18     Temp 10/09/20 0857 97.8 F (36.6 C)     Temp Source 10/09/20 0857 Oral     SpO2 10/09/20 0857 97 %     Weight --      Height --      Head Circumference --      Peak Flow --      Pain Score 10/09/20 0859 5     Pain Loc --      Pain Edu? --      Excl. in [place]? --    No data found.  Updated Vital Signs BP 118/77 (BP Location: Right Arm)   Pulse 88   Temp 97.8 F (36.6 C) (Oral)   Resp 18   SpO2 97%   Visual Acuity Right Eye Distance:   Left Eye Distance:   Bilateral Distance:    Right Eye Near:   Left Eye Near:    Bilateral Near:     Physical Exam  General Appearance:    Alert,acutely ill appearing, cooperative, no distress  HENT:   Normocephalic, ears normal, nares mucosal edema with congestion, rhinorrhea, oropharynx  Erythematous without exudate  Eyes:    PERRL, conjunctiva/corneas clear, EOM's intact       Lungs:     Clear to auscultation bilaterally, respirations unlabored  Heart:    Regular rate and rhythm  Neurologic:   Awake, alert, oriented x 3.  No apparent focal neurological           defect.      UC Treatments / Results  Labs (all labs ordered are listed, but only abnormal results are displayed) Labs Reviewed  COVID-19, FLU A+B NAA    EKG   Radiology No results found.  Procedures Procedures (including critical care time)  Medications Ordered in UC Medications - No data to display  Initial Impression / Assessment and Plan / UC Course  I have reviewed the triage vital signs and the nursing notes.  Pertinent labs & imaging results that were available during my care of the patient were reviewed by me and considered in my medical decision making (see chart for details).    Testing today for Covid and flu.  Given overall exam findings consistent with that of an acute sinusitis covering with amoxicillin x7 days.  Given generalized facial pressure in profound sinus congestion covering with a brief course of prednisone x3 days.  For cough management Promethazine DM.  Red flag precautions discussed.  Patient follow-up primary care as needed. Final Clinical Impressions(s) / UC Diagnoses   Final diagnoses:  Exposure to COVID-19 virus  Acute non-recurrent pansinusitis  Cough     Discharge Instructions     Your Covid /FLU test results will be available within 3 days however can take up to 5 days to be resulted.  Our office will contact you if the results are positive.  If results are negative continue current course of treatment.  Follow-up with primary care provider if symptoms worsen or do not improve.    ED Prescriptions    Medication Sig Dispense Auth. Provider   predniSONE (DELTASONE) 20 MG tablet Take 2 tablets (40 mg total) by mouth daily with breakfast for 3 days. 6 tablet Scot Jun, FNP   amoxicillin (AMOXIL) 875 MG tablet Take 1 tablet (875 mg total) by mouth 2 (two) times  daily for 10 days. 20 tablet Scot Jun, FNP   promethazine-dextromethorphan (PROMETHAZINE-DM) 6.25-15 MG/5ML syrup Take 5  mLs by mouth 4 (four) times daily as needed for cough. 118 mL Scot Jun, FNP     PDMP not reviewed this encounter.   Scot Jun, FNP 10/09/20 [place], [place], [place] 10/09/20 [phone]

## 2020-10-09 NOTE — Discharge Instructions (Addendum)
Your Covid /FLU test results will be available within 3 days however can take up to 5 days to be resulted.  Our office will contact you if the results are positive.  If results are negative continue current course of treatment.  Follow-up with primary care provider if symptoms worsen or do not improve.

## 2020-10-09 NOTE — ED Triage Notes (Signed)
Productive Cough - yellow sputum, sore throat, nasal congestion.  Sinus pain, body aches since 3 days ago.

## 2020-10-10 LAB — COVID-19, FLU A+B NAA
Influenza A, NAA: NOT DETECTED
Influenza B, NAA: NOT DETECTED
SARS-CoV-2, NAA: NOT DETECTED

## 2021-05-13 IMAGING — MR MR KNEE*L* W/O CM
6 series · 37 of 40 positions shown · non-contrast
Comparison: None.

CLINICAL DATA: Chronic medial knee pain. No injury or prior
surgery.

EXAM:
MRI OF THE LEFT KNEE WITHOUT CONTRAST
TECHNIQUE: Multiplanar, multisequence MR imaging of the knee was performed. No
intravenous contrast was administered.

[Series 6: T2 fat-sat · axial · left · 4.0mm · 0.50mm/px · z∈[-55,+98]mm · 8 of 36 slices shown (1 of 3)]
[im 1/36]
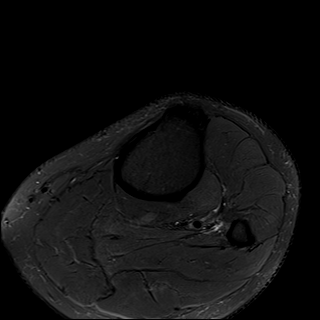
[im 6/36]
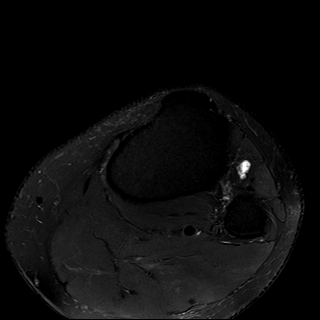
[im 11/36]
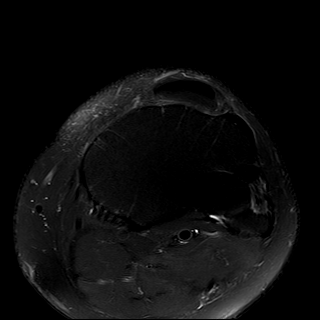
[im 16/36]
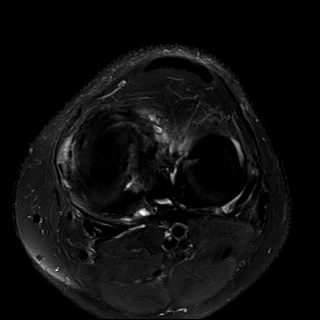
[im 21/36]
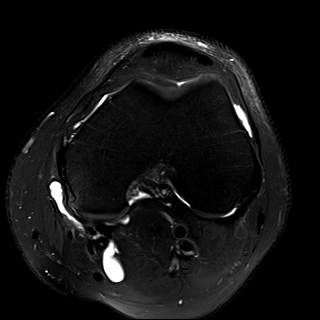
[im 26/36]
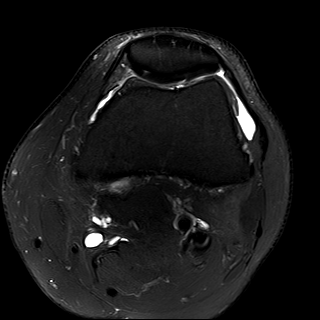
[im 31/36]
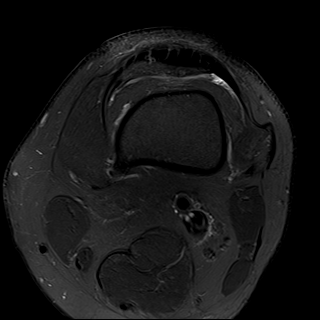
[im 36/36]
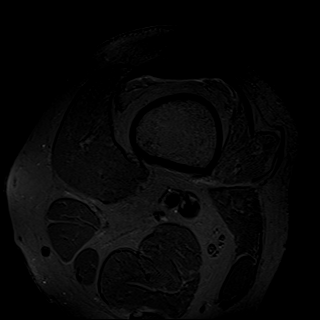

[Series 7: T2 fat-sat · coronal · left · 4.0mm · 0.39mm/px · 6 of 28 slices shown (2 of 3)]
[im 1/28]
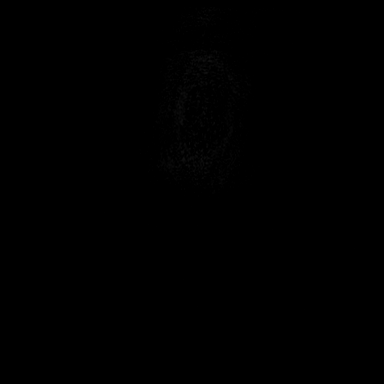
[im 6/28]
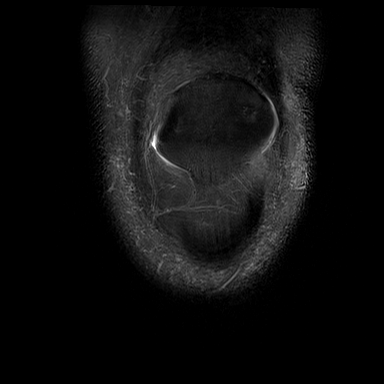
[im 11/28]
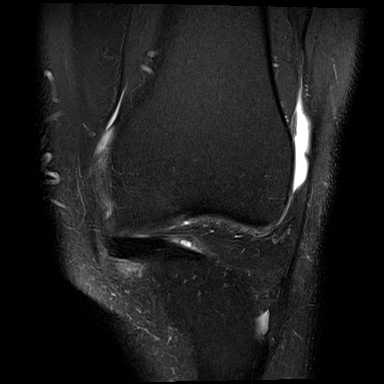
[im 17/28]
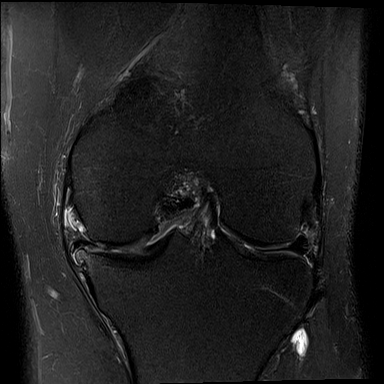
[im 22/28]
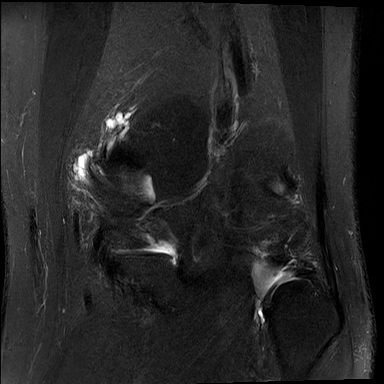
[im 28/28]
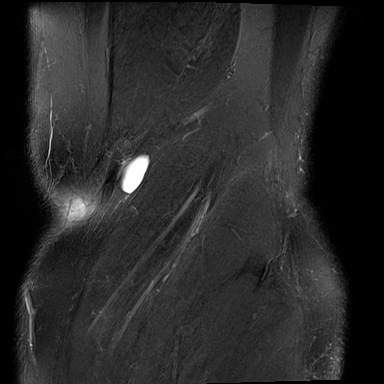

[Series 8: T1 · coronal · left · 4.0mm · 0.39mm/px · 3 of 28 slices shown]
[im 1/28]
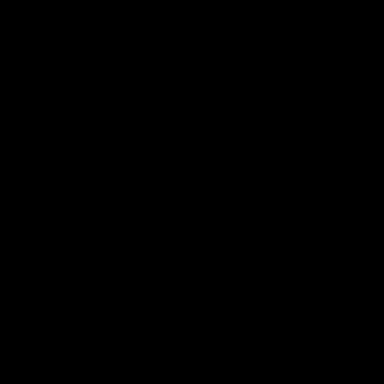
[im 6/28]
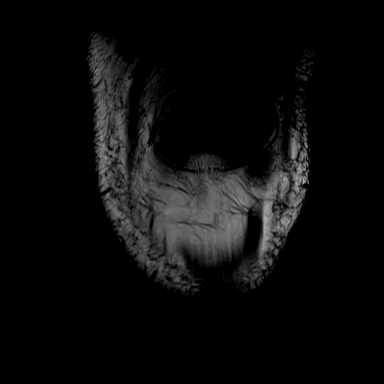
[im 11/28]
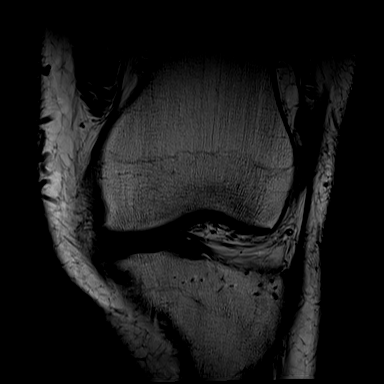

[Series 9: PD fat-sat · coronal · left · 3.0mm · 0.47mm/px · 6 of 28 slices shown (1 of 2)]
[im 1/28]
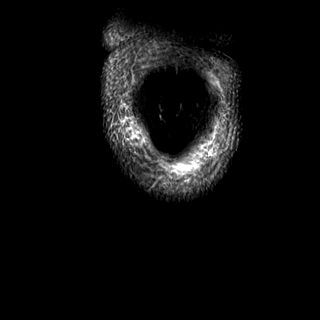
[im 6/28]
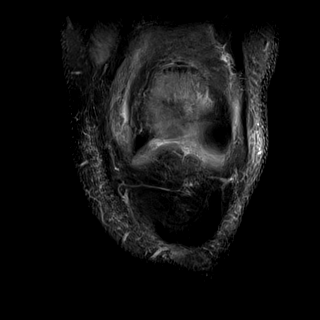
[im 11/28]
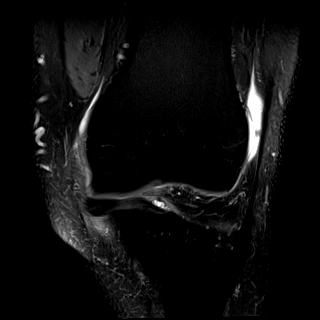
[im 17/28]
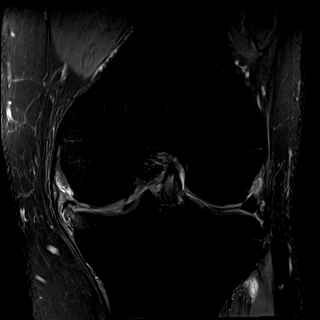
[im 22/28]
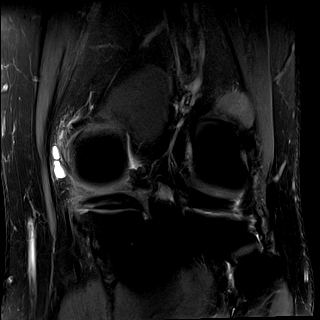
[im 28/28]
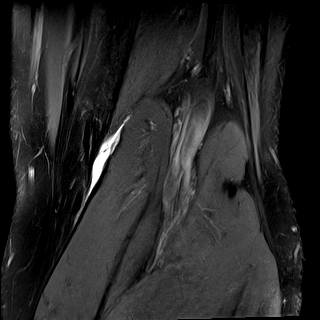

[Series 10: PD fat-sat · sagittal · left · 3.0mm · 0.39mm/px · 7 of 31 slices shown (2 of 2)]
[im 1/31]
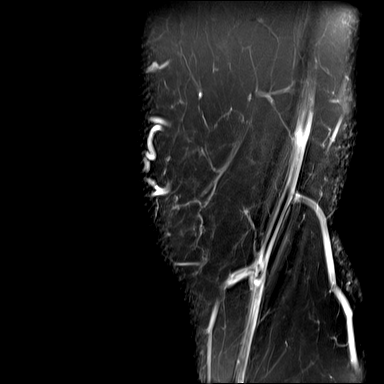
[im 6/31]
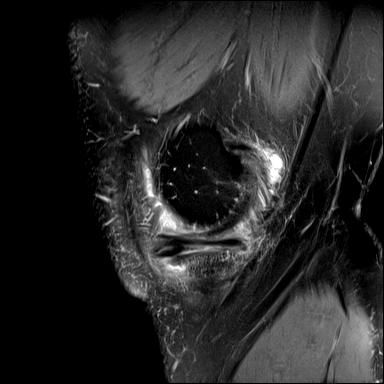
[im 11/31]
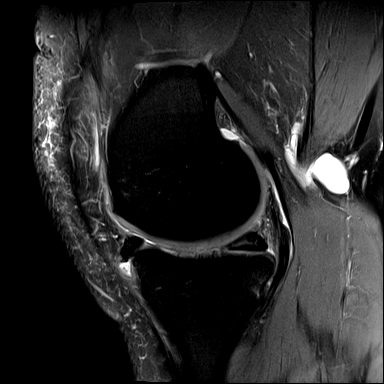
[im 16/31]
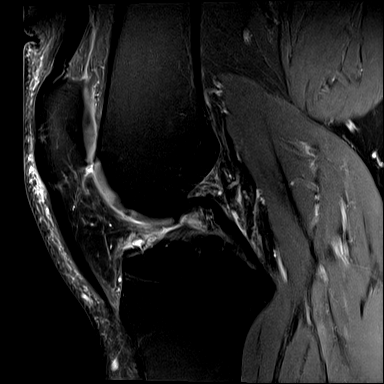
[im 21/31]
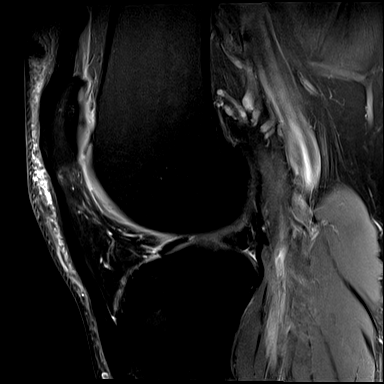
[im 26/31]
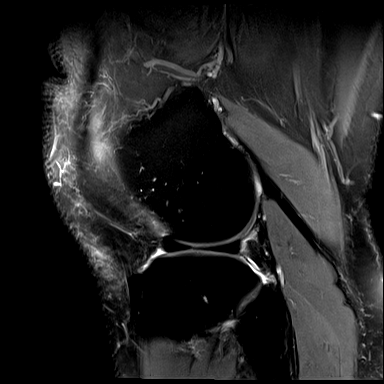
[im 31/31]
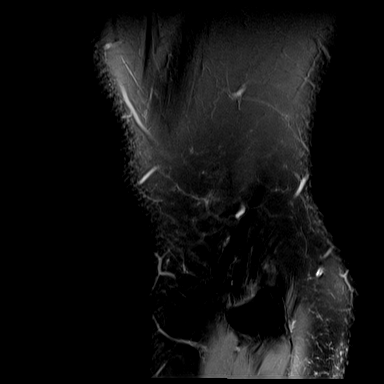

[Series 11: T2 fat-sat · sagittal · left · 3.0mm · 0.39mm/px · 7 of 31 slices shown (3 of 3)]
[im 1/31]
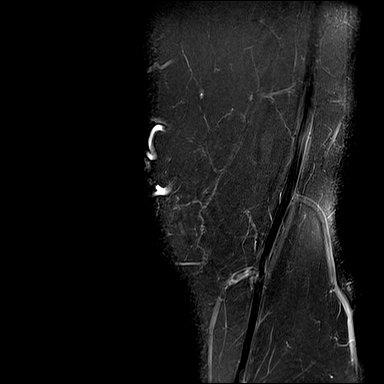
[im 6/31]
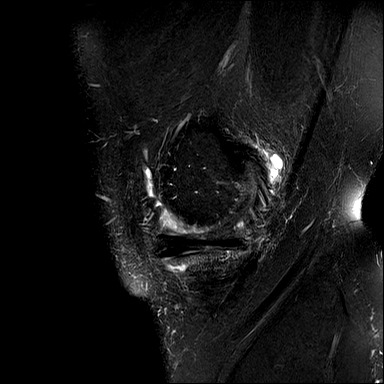
[im 11/31]
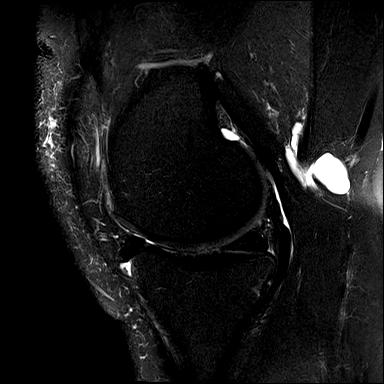
[im 16/31]
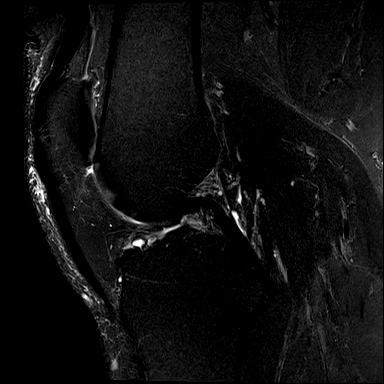
[im 21/31]
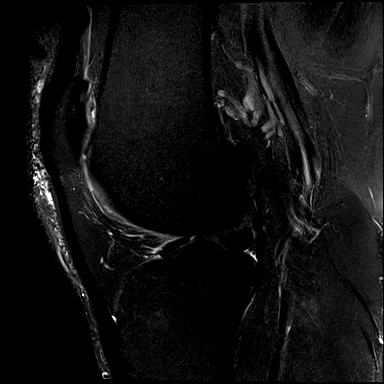
[im 26/31]
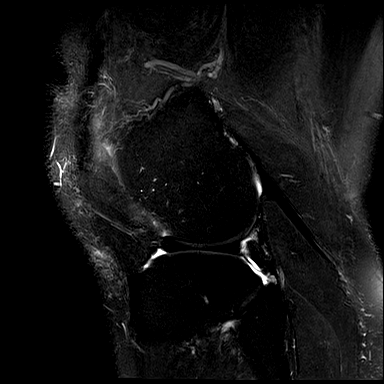
[im 31/31]
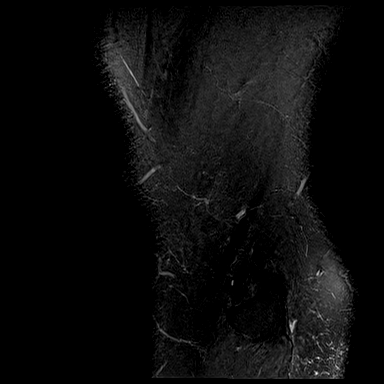

[37 of 40 positions shown; findings below may reference images not displayed]

FINDINGS: MENISCI

Medial meniscus: Horizontal undersurface tear of the body extending
to the anterior and posterior horn junctions. Additional small
peripheral longitudinal tear at the body/posterior horn junction.

Lateral meniscus:  Intact.

LIGAMENTS

Cruciates:  Intact ACL and PCL.

Collaterals: Medial collateral ligament is intact. Lateral
collateral ligament complex is intact.

CARTILAGE

Patellofemoral: Full-thickness fissuring over the medial and lateral
patellar facet. Focal partial-thickness cartilage loss over the
medial trochlea.

Medial: Mild superficial irregularity and partial-thickness
cartilage loss over the central weight-bearing medial femoral
condyle and medial tibial plateau.

Lateral:  No chondral defect.

Joint:  Trace joint effusion.

Popliteal Fossa:  Small Baker cyst.  Intact popliteus tendon.

Extensor Mechanism: Intact quadriceps tendon and patellar tendon.
Intact medial and lateral patellar retinaculum. Intact MPFL.

Bones: No focal marrow signal abnormality. No fracture or
dislocation.

Other: 1.6 x 0.6 x 2.0 cm ganglion cyst superficial to the posterior
proximal MCL. It is unclear if this communicates with the nearby
Baker cyst or with the joint space through a small defect in the
joint capsule.
IMPRESSION: 1. Horizontal undersurface tear of the medial meniscus body
extending to the anterior and posterior horn junctions. Additional
small peripheral longitudinal tear at the body/posterior horn
junction.
2. Mild medial compartment osteoarthritis.
3. 2.0 cm ganglion cyst superficial to the posterior proximal MCL.
It is unclear if this communicates with the nearby Baker cyst or
with the joint space through a small defect in the joint capsule.

## 2021-08-01 ENCOUNTER — Encounter: Payer: Self-pay | Admitting: Emergency Medicine

## 2021-08-01 ENCOUNTER — Other Ambulatory Visit: Payer: Self-pay

## 2021-08-01 ENCOUNTER — Ambulatory Visit
Admission: EM | Admit: 2021-08-01 | Discharge: 2021-08-01 | Disposition: A | Discharge status: Home / Self Care | Payer: 59 | Attending: Family Medicine | Admitting: Family Medicine

## 2021-08-01 DIAGNOSIS — R197 Diarrhea, unspecified: Secondary | ICD-10-CM

## 2021-08-01 DIAGNOSIS — R110 Nausea: Secondary | ICD-10-CM | POA: Diagnosis not present

## 2021-08-01 DIAGNOSIS — Z1152 Encounter for screening for COVID-19: Secondary | ICD-10-CM

## 2021-08-01 DIAGNOSIS — R6883 Chills (without fever): Secondary | ICD-10-CM

## 2021-08-01 DIAGNOSIS — R509 Fever, unspecified: Secondary | ICD-10-CM

## 2021-08-01 MED ORDER — OSELTAMIVIR PHOSPHATE 75 MG PO CAPS: 75.0000 mg | ORAL_CAPSULE | Freq: Two times a day (BID) | ORAL | 0 refills | Status: DC

## 2021-08-01 MED ORDER — ONDANSETRON 4 MG PO TBDP: 4.0000 mg | ORAL_TABLET | Freq: Three times a day (TID) | ORAL | 0 refills | Status: DC | PRN

## 2021-08-01 NOTE — ED Provider Notes (Signed)
[facility]    CSN: [id number] Arrival date & time: 08/01/21  0841      History   Chief Complaint Chief Complaint  Patient presents with   Diarrhea   Sore Throat    HPI Scott [name] is a 55 y.o. male.   Presenting today with 4-day history of diarrhea, sore throat, generalized body aches, fatigue, low-grade fevers.  Denies chest pain, shortness of breath, intolerance to p.o., rashes.  States he was on a cruise when he got sick and his entire family is sick with similar symptoms.  Has been taking over-the-counter antidiarrheals and pain relievers for the body aches with mild temporary relief of symptoms.  Past Medical History:  Diagnosis Date   Anemia    Anxiety    Arthritis    Back pain    pinched nerve sciatica   Diabetes mellitus without complication ([facility])    type  2   Elevated cholesterol with elevated triglycerides    Elevated liver enzymes 2014   normal now   GERD (gastroesophageal reflux disease)    Hypertension    Hypertension    Sleep apnea    uses cpap,setting of 14   Patient Active Problem List   Diagnosis Date Noted   Palpitations 02/21/2016   Past Surgical History:  Procedure Laterality Date   CATARACT EXTRACTION Left    COLONOSCOPY N/A 09/29/2012   Procedure: COLONOSCOPY;  Surgeon: Juanita Craver, MD;  [facility];  Service: Endoscopy;  Laterality: N/A;   COLONOSCOPY WITH PROPOFOL N/A 06/22/2016   Procedure: COLONOSCOPY WITH PROPOFOL;  Surgeon: Carol Ada, MD;  [facility];  Service: Endoscopy;  Laterality: N/A;   COLONOSCOPY WITH PROPOFOL N/A 05/20/2020   Procedure: COLONOSCOPY WITH PROPOFOL;  Surgeon: Carol Ada, MD;  [facility];  Service: Endoscopy;  Laterality: N/A;   ENTEROSCOPY N/A 05/20/2020   Procedure: ENTEROSCOPY;  Surgeon: Carol Ada, MD;  [facility];  Service: Endoscopy;  Laterality: N/A;   ESOPHAGOGASTRODUODENOSCOPY N/A 09/29/2012   Procedure:  ESOPHAGOGASTRODUODENOSCOPY (EGD);  Surgeon: Juanita Craver, MD;  [facility];  Service: Endoscopy;  Laterality: N/A;   ESOPHAGOGASTRODUODENOSCOPY (EGD) WITH PROPOFOL N/A 06/22/2016   Procedure: ESOPHAGOGASTRODUODENOSCOPY (EGD) WITH PROPOFOL;  Surgeon: Carol Ada, MD;  [facility];  Service: Endoscopy;  Laterality: N/A;   FRACTURE SURGERY     right wrist with screws   HERNIA REPAIR  2008   inguinal   KNEE ARTHROSCOPY Left 06/24/2020   Procedure: ARTHROSCOPY KNEE WITH PARTIAL MEDIAL MENISCECTOMY, CHONDROPLASTY,  OPEN CYSTECTOMY;  Surgeon: Dorna Leitz, MD;  Location: [facility];  Service: Orthopedics;  Laterality: Left;       Home Medications    Prior to Admission medications   Medication Sig Start Date End Date Taking? Authorizing Provider  atenolol-chlorthalidone (TENORETIC) 100-25 MG per tablet Take 1 tablet by mouth daily.   Yes [provider]  atorvastatin (LIPITOR) 20 MG tablet Take 20 mg by mouth every evening.    Yes [provider]  busPIRone (BUSPAR) 10 MG tablet Take 10 mg by mouth daily.  07/07/15  Yes [provider]  Canagliflozin-Metformin HCl ER (INVOKAMET XR) 50-500 MG TB24 Take 2 tablets by mouth daily before breakfast. Patient taking differently: Take 1 tablet by mouth in the morning and at bedtime. 07/02/16  Yes Philemon Kingdom, MD  ferrous sulfate 325 (65 FE) MG tablet Take 325 mg by mouth daily.   Yes [provider]  lisinopril (PRINIVIL,ZESTRIL) 2.5 MG tablet Take 2.5 mg by mouth every  evening.    Yes [provider]  Multiple Vitamins-Minerals (IMMUNE SYSTEM BOOSTER PO) Take 2 tablets by mouth daily. Chewables   Yes [provider]  Omega-3 Fatty Acids (FISH OIL) 1000 MG CAPS Take 1,000 mg by mouth at bedtime.   Yes [provider]  omeprazole (PRILOSEC) 40 MG capsule Take 40 mg by mouth 2 (two) times daily. 05/09/20  Yes [provider]  ondansetron (ZOFRAN-ODT) 4 MG  disintegrating tablet Take 1 tablet (4 mg total) by mouth every 8 (eight) hours as needed for nausea or vomiting. 08/01/21  Yes Volney American, PA-C  oseltamivir (TAMIFLU) 75 MG capsule Take 1 capsule (75 mg total) by mouth every 12 (twelve) hours. 08/01/21  Yes Volney American, PA-C  RYBELSUS 7 MG TABS Take 7 mg by mouth daily.  04/29/20  Yes [provider]  zolpidem (AMBIEN) 10 MG tablet Take 10 mg by mouth at bedtime. 05/06/20  Yes [provider]  fluticasone (FLONASE) 50 MCG/ACT nasal spray Place 1 spray into both nostrils daily for 14 days. 04/06/20 05/12/21  Avegno, Darrelyn Hillock, FNP  gabapentin (NEURONTIN) 300 MG capsule Take 300 mg by mouth at bedtime.     [provider]  glucose blood test strip Use to check blood sugar once a day. 07/02/16   Philemon Kingdom, MD  HYDROcodone-acetaminophen (NORCO) 10-325 MG tablet Take 1-2 tablets by mouth daily as needed (pain.).  06/06/20   [provider]  hydrOXYzine (ATARAX/VISTARIL) 25 MG tablet Take 25 mg by mouth at bedtime. 07/04/15   [provider]  LORazepam (ATIVAN) 0.5 MG tablet Take 0.5 mg by mouth at bedtime.    [provider]  meloxicam (MOBIC) 15 MG tablet Take 15 mg by mouth daily. 06/03/20   [provider]  MICROLET LANCETS MISC Use to check blood sugar. 07/02/16   Philemon Kingdom, MD  Multiple Vitamin (MULTIVITAMIN WITH MINERALS) TABS tablet Take 1 tablet by mouth daily. Multivitamin for Adults 50+ (Iron Free)    [provider]  promethazine-dextromethorphan (PROMETHAZINE-DM) 6.25-15 MG/5ML syrup Take 5 mLs by mouth 4 (four) times daily as needed for cough. 10/09/20   Scot Jun, FNP  rifaximin (XIFAXAN) 200 MG tablet Take 200 mg by mouth 3 (three) times daily.    [provider]  timolol (TIMOPTIC) 0.5 % ophthalmic solution Place 1 drop into the left eye 2 (two) times daily. 05/06/20   [provider]  tiZANidine (ZANAFLEX) 4  MG tablet Take 4 mg by mouth at bedtime. 04/29/20   [provider]  TURMERIC PO Take 2 capsules by mouth daily.    [provider]  liraglutide (VICTOZA) 18 MG/3ML SOPN Inject 1.2 mg into the skin every evening.  11/02/19  [provider]    Family History Family History  Problem Relation Age of Onset   Diabetes Mother    Hypertension Mother    Hyperlipidemia Mother    Cancer Father    Thyroid disease Sister     Social History Social History   Tobacco Use   Smoking status: Never   Smokeless tobacco: Never  Vaping Use   Vaping Use: Never used  Substance Use Topics   Alcohol use: Yes    Comment: rarely   Drug use: Yes    Types: Marijuana    Comment: rare marijuana use only last used 3 months ago     Allergies   Trulicity [dulaglutide] and Metformin and related   Review of Systems Review of  Systems Per HPI  Physical Exam Triage Vital Signs ED Triage Vitals  Enc Vitals Group     BP 08/01/21 1037 118/77     Pulse Rate 08/01/21 1037 77     Resp 08/01/21 1037 16     Temp 08/01/21 1037 99.1 F (37.3 C)     Temp Source 08/01/21 1037 Oral     SpO2 08/01/21 1037 94 %     Weight --      Height --      Head Circumference --      Peak Flow --      Pain Score 08/01/21 1040 2     Pain Loc --      Pain Edu? --      Excl. in [place]? --    No data found.  Updated Vital Signs BP 118/77 (BP Location: Right Arm)    Pulse 77    Temp 99.1 F (37.3 C) (Oral)    Resp 16    SpO2 94%   Visual Acuity Right Eye Distance:   Left Eye Distance:   Bilateral Distance:    Right Eye Near:   Left Eye Near:    Bilateral Near:     Physical Exam Vitals and nursing note reviewed.  Constitutional:      Appearance: Normal appearance.  HENT:     Head: Atraumatic.     Right Ear: Tympanic membrane normal.     Left Ear: Tympanic membrane normal.     Nose: Nose normal.     Mouth/Throat:     Mouth: Mucous membranes are moist.     Pharynx: Posterior  oropharyngeal erythema present.  Eyes:     Extraocular Movements: Extraocular movements intact.     Conjunctiva/sclera: Conjunctivae normal.  Cardiovascular:     Rate and Rhythm: Normal rate and regular rhythm.     Heart sounds: Normal heart sounds.  Pulmonary:     Effort: Pulmonary effort is normal.     Breath sounds: Normal breath sounds.  Abdominal:     General: There is no distension.     Palpations: Abdomen is soft. There is no mass.     Tenderness: There is no abdominal tenderness. There is no right CVA tenderness, left CVA tenderness, guarding or rebound.     Comments: Bowel sounds hyperactive  Musculoskeletal:        General: Normal range of motion.     Cervical back: Normal range of motion and neck supple.  Skin:    General: Skin is warm and dry.  Neurological:     General: No focal deficit present.     Mental Status: He is oriented to person, place, and time.  Psychiatric:        Mood and Affect: Mood normal.        Thought Content: Thought content normal.        Judgment: Judgment normal.   UC Treatments / Results  Labs (all labs ordered are listed, but only abnormal results are displayed) Labs Reviewed  COVID-19, FLU A+B NAA    EKG   Radiology No results found.  Procedures Procedures (including critical care time)  Medications Ordered in UC Medications - No data to display  Initial Impression / Assessment and Plan / UC Course  I have reviewed the triage vital signs and the nursing notes.  Pertinent labs & imaging results that were available during my care of the patient were reviewed by me and considered in my medical decision making (see chart  for details).     Overall vital signs and exam reassuring, suspect influenza.  COVID and flu testing pending, will treat proactively with Tamiflu, Zofran for symptomatic benefit, can continue Imodium and push fluids.  Return for acutely worsening symptoms.  Final Clinical Impressions(s) / UC Diagnoses    Final diagnoses:  Diarrhea, unspecified type  Nausea without vomiting  Chills  Fever, unspecified   Discharge Instructions   None    ED Prescriptions     Medication Sig Dispense Auth. Provider   oseltamivir (TAMIFLU) 75 MG capsule Take 1 capsule (75 mg total) by mouth every 12 (twelve) hours. 10 capsule Volney American, PA-C   ondansetron (ZOFRAN-ODT) 4 MG disintegrating tablet Take 1 tablet (4 mg total) by mouth every 8 (eight) hours as needed for nausea or vomiting. 20 tablet Volney American, [place]      PDMP not reviewed this encounter.   Volney American, [place] 08/01/21 1342

## 2021-08-01 NOTE — ED Triage Notes (Signed)
Patient c/o diarrhea and sore throat x 4 days.   Patient endorses generalized body aches and fatigue.   Patient endorses constant diarrhea. Patient endorses ABD pain.   Patient denies N/V.   Patient has used OTC antidiarrheal with minimal relief of symptoms.

## 2021-08-02 LAB — COVID-19, FLU A+B NAA
Influenza A, NAA: NOT DETECTED
Influenza B, NAA: NOT DETECTED
SARS-CoV-2, NAA: NOT DETECTED

## 2021-08-05 ENCOUNTER — Other Ambulatory Visit: Payer: Self-pay

## 2021-08-05 ENCOUNTER — Encounter: Payer: Self-pay | Admitting: Emergency Medicine

## 2021-08-05 ENCOUNTER — Ambulatory Visit
Admission: EM | Admit: 2021-08-05 | Discharge: 2021-08-05 | Disposition: A | Discharge status: Home / Self Care | Payer: 59

## 2021-08-05 DIAGNOSIS — R051 Acute cough: Secondary | ICD-10-CM

## 2021-08-05 DIAGNOSIS — R52 Pain, unspecified: Secondary | ICD-10-CM | POA: Diagnosis not present

## 2021-08-05 NOTE — ED Provider Notes (Signed)
[facility]    CSN: [id number] Arrival date & time: 08/05/21  0807      History   Chief Complaint Chief Complaint  Patient presents with   Fatigue   Generalized Body Aches   Nasal Congestion   Cough    HPI Scott [name] is a 55 y.o. male.   Patient presenting today following up on visit previously in the week for viral symptoms to include body aches, fatigue, runny nose, fever and cough.  Was also having abdominal pain, diarrhea at prior visit but states that has resolved.  He feels that his symptoms are improving but wanted to get checked out as they were lingering so long.  Has been taking over-the-counter supportive medications with mild relief temporarily.  Denies chest pain, shortness of breath, vomiting, diarrhea, sore throat, ear pain.  Multiple sick family members in the house as well.  COVID, flu testing was negative.   Past Medical History:  Diagnosis Date   Anemia    Anxiety    Arthritis    Back pain    pinched nerve sciatica   Diabetes mellitus without complication (HCC)    type  2   Elevated cholesterol with elevated triglycerides    Elevated liver enzymes 2014   normal now   GERD (gastroesophageal reflux disease)    Hypertension    Hypertension    Sleep apnea    uses cpap,setting of 14    Patient Active Problem List   Diagnosis Date Noted   Palpitations 02/21/2016    Past Surgical History:  Procedure Laterality Date   CATARACT EXTRACTION Left    COLONOSCOPY N/A 09/29/2012   Procedure: COLONOSCOPY;  Surgeon: Juanita Craver, MD;  Location: [facility];  Service: Endoscopy;  Laterality: N/A;   COLONOSCOPY WITH PROPOFOL N/A 06/22/2016   Procedure: COLONOSCOPY WITH PROPOFOL;  Surgeon: Carol Ada, MD;  Location: [facility];  Service: Endoscopy;  Laterality: N/A;   COLONOSCOPY WITH PROPOFOL N/A 05/20/2020   Procedure: COLONOSCOPY WITH PROPOFOL;  Surgeon: Carol Ada, MD;  Location: [facility];  Service: Endoscopy;  Laterality:  N/A;   ENTEROSCOPY N/A 05/20/2020   Procedure: ENTEROSCOPY;  Surgeon: Carol Ada, MD;  Location: [facility];  Service: Endoscopy;  Laterality: N/A;   ESOPHAGOGASTRODUODENOSCOPY N/A 09/29/2012   Procedure: ESOPHAGOGASTRODUODENOSCOPY (EGD);  Surgeon: Juanita Craver, MD;  Location: [facility];  Service: Endoscopy;  Laterality: N/A;   ESOPHAGOGASTRODUODENOSCOPY (EGD) WITH PROPOFOL N/A 06/22/2016   Procedure: ESOPHAGOGASTRODUODENOSCOPY (EGD) WITH PROPOFOL;  Surgeon: Carol Ada, MD;  Location: [facility];  Service: Endoscopy;  Laterality: N/A;   FRACTURE SURGERY     right wrist with screws   HERNIA REPAIR  2008   inguinal   KNEE ARTHROSCOPY Left 06/24/2020   Procedure: ARTHROSCOPY KNEE WITH PARTIAL MEDIAL MENISCECTOMY, CHONDROPLASTY,  OPEN CYSTECTOMY;  Surgeon: Dorna Leitz, MD;  Location: [facility];  Service: Orthopedics;  Laterality: Left;     Home Medications    Prior to Admission medications   Medication Sig Start Date End Date Taking? Authorizing Provider  atenolol-chlorthalidone (TENORETIC) 100-25 MG per tablet Take 1 tablet by mouth daily.    [provider]  atorvastatin (LIPITOR) 20 MG tablet Take 20 mg by mouth every evening.     [provider]  busPIRone (BUSPAR) 10 MG tablet Take 10 mg by mouth daily.  07/07/15   [provider]  Canagliflozin-Metformin HCl ER (INVOKAMET XR) 50-500 MG TB24 Take 2 tablets by mouth daily before breakfast. Patient taking differently: Take 1 tablet by mouth in  the morning and at bedtime. 07/02/16   Philemon Kingdom, MD  ferrous sulfate 325 (65 FE) MG tablet Take 325 mg by mouth daily.    [provider]  fluticasone (FLONASE) 50 MCG/ACT nasal spray Place 1 spray into both nostrils daily for 14 days. 04/06/20 05/12/21  Avegno, Darrelyn Hillock, FNP  gabapentin (NEURONTIN) 300 MG capsule Take 300 mg by mouth at bedtime.     [provider]  glucose blood test strip Use to check blood sugar once a day. 07/02/16    Philemon Kingdom, MD  HYDROcodone-acetaminophen (NORCO) 10-325 MG tablet Take 1-2 tablets by mouth daily as needed (pain.).  06/06/20   [provider]  hydrOXYzine (ATARAX/VISTARIL) 25 MG tablet Take 25 mg by mouth at bedtime. 07/04/15   [provider]  lisinopril (PRINIVIL,ZESTRIL) 2.5 MG tablet Take 2.5 mg by mouth every evening.     [provider]  LORazepam (ATIVAN) 0.5 MG tablet Take 0.5 mg by mouth at bedtime.    [provider]  meloxicam (MOBIC) 15 MG tablet Take 15 mg by mouth daily. 06/03/20   [provider]  MICROLET LANCETS MISC Use to check blood sugar. 07/02/16   Philemon Kingdom, MD  Multiple Vitamin (MULTIVITAMIN WITH MINERALS) TABS tablet Take 1 tablet by mouth daily. Multivitamin for Adults 50+ (Iron Free)    [provider]  Multiple Vitamins-Minerals (IMMUNE SYSTEM BOOSTER PO) Take 2 tablets by mouth daily. Chewables    [provider]  Omega-3 Fatty Acids (FISH OIL) 1000 MG CAPS Take 1,000 mg by mouth at bedtime.    [provider]  omeprazole (PRILOSEC) 40 MG capsule Take 40 mg by mouth 2 (two) times daily. 05/09/20   [provider]  ondansetron (ZOFRAN-ODT) 4 MG disintegrating tablet Take 1 tablet (4 mg total) by mouth every 8 (eight) hours as needed for nausea or vomiting. 08/01/21   Volney American, PA-C  oseltamivir (TAMIFLU) 75 MG capsule Take 1 capsule (75 mg total) by mouth every 12 (twelve) hours. 08/01/21   Volney American, PA-C  promethazine-dextromethorphan (PROMETHAZINE-DM) 6.25-15 MG/5ML syrup Take 5 mLs by mouth 4 (four) times daily as needed for cough. 10/09/20   Scot Jun, FNP  rifaximin (XIFAXAN) 200 MG tablet Take 200 mg by mouth 3 (three) times daily.    [provider]  RYBELSUS 7 MG TABS Take 7 mg by mouth daily.  04/29/20   [provider]  timolol (TIMOPTIC) 0.5 % ophthalmic solution Place 1 drop into the left eye 2 (two) times daily.  05/06/20   [provider]  tiZANidine (ZANAFLEX) 4 MG tablet Take 4 mg by mouth at bedtime. 04/29/20   [provider]  TURMERIC PO Take 2 capsules by mouth daily.    [provider]  zolpidem (AMBIEN) 10 MG tablet Take 10 mg by mouth at bedtime. 05/06/20   [provider]  liraglutide (VICTOZA) 18 MG/3ML SOPN Inject 1.2 mg into the skin every evening.  11/02/19  [provider]   Family History Family History  Problem Relation Age of Onset   Diabetes Mother    Hypertension Mother    Hyperlipidemia Mother    Cancer Father    Thyroid disease Sister    Social History Social History   Tobacco Use   Smoking status: Never   Smokeless tobacco: Never  Vaping Use   Vaping Use: Never used  Substance Use Topics   Alcohol use: Yes    Comment: rarely  Drug use: Yes    Types: Marijuana    Comment: rare marijuana use only last used 3 months ago   Allergies   Trazodone hcl, Trulicity [dulaglutide], and Metformin and related   Review of Systems Review of Systems Per HPI  Physical Exam Triage Vital Signs ED Triage Vitals  Enc Vitals Group     BP 08/05/21 0824 127/85     Pulse Rate 08/05/21 0824 77     Resp 08/05/21 0824 16     Temp 08/05/21 0824 98.6 F (37 C)     Temp Source 08/05/21 0824 Oral     SpO2 08/05/21 0824 95 %     Weight --      Height --      Head Circumference --      Peak Flow --      Pain Score 08/05/21 0827 4     Pain Loc --      Pain Edu? --      Excl. in [place]? --    No data found.  Updated Vital Signs BP 127/85 (BP Location: Right Arm)    Pulse 77    Temp 98.6 F (37 C) (Oral)    Resp 16    SpO2 95%   Visual Acuity Right Eye Distance:   Left Eye Distance:   Bilateral Distance:    Right Eye Near:   Left Eye Near:    Bilateral Near:     Physical Exam Vitals and nursing note reviewed.  Constitutional:      Appearance: He is well-developed.  HENT:     Head: Atraumatic.     Right Ear: External ear  normal.     Left Ear: External ear normal.     Nose: Nose normal.     Mouth/Throat:     Mouth: Mucous membranes are moist.     Pharynx: Oropharynx is clear. No oropharyngeal exudate.  Eyes:     Conjunctiva/sclera: Conjunctivae normal.     Pupils: Pupils are equal, round, and reactive to light.  Cardiovascular:     Rate and Rhythm: Normal rate and regular rhythm.  Pulmonary:     Effort: Pulmonary effort is normal. No respiratory distress.     Breath sounds: No wheezing or rales.  Abdominal:     General: Bowel sounds are normal. There is no distension.     Palpations: Abdomen is soft.     Tenderness: There is no abdominal tenderness. There is no guarding.  Musculoskeletal:        General: Normal range of motion.     Cervical back: Normal range of motion and neck supple.  Lymphadenopathy:     Cervical: No cervical adenopathy.  Skin:    General: Skin is warm and dry.  Neurological:     Mental Status: He is alert and oriented to person, place, and time.  Psychiatric:        Behavior: Behavior normal.     UC Treatments / Results  Labs (all labs ordered are listed, but only abnormal results are displayed) Labs Reviewed - No data to display  EKG   Radiology No results found.  Procedures Procedures (including critical care time)  Medications Ordered in UC Medications - No data to display  Initial Impression / Assessment and Plan / UC Course  I have reviewed the triage vital signs and the nursing notes.  Pertinent labs & imaging results that were available during my care of the patient were reviewed by me and considered in  my medical decision making (see chart for details).     Vital signs and exam very reassuring, he is overall well-appearing with no significant abnormalities noted on exam.  Appears to be resolving from viral illness.  Discussed continued supportive care, brat diet, push fluids and return for acutely worsening symptoms.  Final Clinical Impressions(s)  / UC Diagnoses   Final diagnoses:  Generalized body aches  Acute cough   Discharge Instructions   None    ED Prescriptions   None    PDMP not reviewed this encounter.   Merrie Roof [place], [place] 08/05/21 [phone]

## 2021-08-05 NOTE — ED Triage Notes (Signed)
Pt was seen on 08/01/21 he is not better. Has bodyaches, fatigue, runny nose, fever, and some cough.

## 2021-09-19 ENCOUNTER — Ambulatory Visit: Payer: 59 | Admitting: Orthopedic Surgery

## 2021-09-21 ENCOUNTER — Ambulatory Visit: Payer: 59 | Admitting: Orthopedic Surgery

## 2021-09-26 ENCOUNTER — Other Ambulatory Visit: Payer: Self-pay

## 2021-09-26 ENCOUNTER — Encounter: Payer: Self-pay | Admitting: Orthopedic Surgery

## 2021-09-26 ENCOUNTER — Ambulatory Visit (INDEPENDENT_AMBULATORY_CARE_PROVIDER_SITE_OTHER): Payer: 59 | Admitting: Orthopedic Surgery

## 2021-09-26 DIAGNOSIS — M48062 Spinal stenosis, lumbar region with neurogenic claudication: Secondary | ICD-10-CM

## 2021-09-26 NOTE — Progress Notes (Signed)
Office Visit Note   Patient: Scott [name]           Date of Birth: [birth year]           MRN: [id number] Visit Date: 09/26/2021              Requested by: Shirline Frees, MD [address],  [place] [address] PCP: Shirline Frees, MD  Chief Complaint  Patient presents with   Left Foot - Pain   Right Foot - Pain      HPI: Patient is a 56 year old gentleman who presents complaining of burning pain from his knees to his feet.  He has a history of diabetes states the pain is worse with standing and prolonged working.  Patient states his symptoms seem better if he leans forward or sits down.  Patient states he stands a lot for work.  He was concerned that this was from neuropathy.  He does take Neurontin 300 mg at night his most recent hemoglobin A1c is 6.9.  Assessment & Plan: Visit Diagnoses:  1. Spinal stenosis of lumbar region with neurogenic claudication     Plan: Patient's symptoms seem to be more symptomatic from spinal etiology than neuropathy.  We will have patient evaluated by Dr. Ernestina Patches for possible steroid injection.  Follow-Up Instructions: Return if symptoms worsen or fail to improve, for Follow-up with Dr. Ernestina Patches.   Ortho Exam  Patient is alert, oriented, no adenopathy, well-dressed, normal affect, normal respiratory effort. Examination patient does have pain with a straight leg raise bilaterally there is no focal motor weakness in either lower extremity.  Patient's symptoms are from his knees to his feet bilaterally worse with standing.  Patient has a strong palpable dorsalis pedis and posterior tibial pulse good hair growth no clinical signs of arterial insufficiency.  Previous MRI scan showed multilevel disc disease.  Imaging: No results found. No images are attached to the encounter.  Labs: Lab Results  Component Value Date   HGBA1C 6.6 (H) 06/21/2020   HGBA1C 6.1 07/02/2016     No results found for: ALBUMIN, PREALBUMIN,  CBC  No results found for: MG No results found for: VD25OH  No results found for: PREALBUMIN CBC EXTENDED Latest Ref Rng & Units 06/21/2020 06/22/2016  WBC 4.0 - 10.5 K/uL 5.1 -  RBC 4.22 - 5.81 MIL/uL 5.25 -  HGB 13.0 - 17.0 g/dL 13.6 14.3  HCT 39.0 - 52.0 % 43.5 42.0  PLT 150 - 400 K/uL 257 -     There is no height or weight on file to calculate BMI.  Orders:  No orders of the defined types were placed in this encounter.  No orders of the defined types were placed in this encounter.    Procedures: No procedures performed  Clinical Data: No additional findings.  ROS:  All other systems negative, except as noted in the HPI. Review of Systems  Objective: Vital Signs: There were no vitals taken for this visit.  Specialty Comments:  No specialty comments available.  PMFS History: Patient Active Problem List   Diagnosis Date Noted   Palpitations 02/21/2016   Past Medical History:  Diagnosis Date   Anemia    Anxiety    Arthritis    Back pain    pinched nerve sciatica   Diabetes mellitus without complication ([place])    type  2   Elevated cholesterol with elevated triglycerides    Elevated liver enzymes 2014   normal now   GERD (gastroesophageal  reflux disease)    Hypertension    Hypertension    Sleep apnea    uses cpap,setting of 14    Family History  Problem Relation Age of Onset   Diabetes Mother    Hypertension Mother    Hyperlipidemia Mother    Cancer Father    Thyroid disease Sister     Past Surgical History:  Procedure Laterality Date   CATARACT EXTRACTION Left    COLONOSCOPY N/A 09/29/2012   Procedure: COLONOSCOPY;  Surgeon: Juanita Craver, MD;  Location: [facility];  Service: Endoscopy;  Laterality: N/A;   COLONOSCOPY WITH PROPOFOL N/A 06/22/2016   Procedure: COLONOSCOPY WITH PROPOFOL;  Surgeon: Carol Ada, MD;  Location: [facility];  Service: Endoscopy;  Laterality: N/A;   COLONOSCOPY WITH PROPOFOL N/A 05/20/2020   Procedure:  COLONOSCOPY WITH PROPOFOL;  Surgeon: Carol Ada, MD;  Location: [facility];  Service: Endoscopy;  Laterality: N/A;   ENTEROSCOPY N/A 05/20/2020   Procedure: ENTEROSCOPY;  Surgeon: Carol Ada, MD;  Location: [facility];  Service: Endoscopy;  Laterality: N/A;   ESOPHAGOGASTRODUODENOSCOPY N/A 09/29/2012   Procedure: ESOPHAGOGASTRODUODENOSCOPY (EGD);  Surgeon: Juanita Craver, MD;  Location: [facility];  Service: Endoscopy;  Laterality: N/A;   ESOPHAGOGASTRODUODENOSCOPY (EGD) WITH PROPOFOL N/A 06/22/2016   Procedure: ESOPHAGOGASTRODUODENOSCOPY (EGD) WITH PROPOFOL;  Surgeon: Carol Ada, MD;  Location: [facility];  Service: Endoscopy;  Laterality: N/A;   FRACTURE SURGERY     right wrist with screws   HERNIA REPAIR  2008   inguinal   KNEE ARTHROSCOPY Left 06/24/2020   Procedure: ARTHROSCOPY KNEE WITH PARTIAL MEDIAL MENISCECTOMY, CHONDROPLASTY,  OPEN CYSTECTOMY;  Surgeon: Dorna Leitz, MD;  Location: [facility];  Service: Orthopedics;  Laterality: Left;   Social History   Occupational History   Not on file  Tobacco Use   Smoking status: Never   Smokeless tobacco: Never  Vaping Use   Vaping Use: Never used  Substance and Sexual Activity   Alcohol use: Yes    Comment: rarely   Drug use: Yes    Types: Marijuana    Comment: rare marijuana use only last used 3 months ago   Sexual activity: Yes

## 2021-10-05 ENCOUNTER — Ambulatory Visit (INDEPENDENT_AMBULATORY_CARE_PROVIDER_SITE_OTHER): Payer: 59 | Admitting: Physical Medicine and Rehabilitation

## 2021-10-05 ENCOUNTER — Other Ambulatory Visit: Payer: Self-pay

## 2021-10-05 ENCOUNTER — Encounter: Payer: Self-pay | Admitting: Physical Medicine and Rehabilitation

## 2021-10-05 VITALS — BP 121/78 | HR 86

## 2021-10-05 DIAGNOSIS — M5416 Radiculopathy, lumbar region: Secondary | ICD-10-CM

## 2021-10-05 DIAGNOSIS — M5116 Intervertebral disc disorders with radiculopathy, lumbar region: Secondary | ICD-10-CM | POA: Diagnosis not present

## 2021-10-05 DIAGNOSIS — R202 Paresthesia of skin: Secondary | ICD-10-CM | POA: Diagnosis not present

## 2021-10-05 DIAGNOSIS — M4726 Other spondylosis with radiculopathy, lumbar region: Secondary | ICD-10-CM | POA: Diagnosis not present

## 2021-10-05 NOTE — Progress Notes (Signed)
Pt state lower back pain that travels down both legs. Pt state he feels some numbness and pain behind his knees. Pt state his legs feels heavy and stiff. Pt state he standing for a long time for work. Pt state he takes pain meds to help ease his pain. ? ?Numeric Pain Rating Scale and Functional Assessment ?Average Pain 8 ?Pain Right Now 3 ?My pain is intermittent, sharp, burning, dull, tingling, and aching ?Pain is worse with: walking, sitting, standing, and some activites ?Pain improves with: medication ? ? ?In the last MONTH (on 0-10 scale) has pain interfered with the following? ? ?1. General activity like being  able to carry out your everyday physical activities such as walking, climbing stairs, carrying groceries, or moving a chair?  ?Rating(7) ? ?2. Relation with others like being able to carry out your usual social activities and roles such as  activities at home, at work and in your community. ?Rating(8) ? ?3. Enjoyment of life such that you have  been bothered by emotional problems such as feeling anxious, depressed or irritable?  ?Rating(9) ? ?

## 2021-10-05 NOTE — Progress Notes (Addendum)
Scott [name] - 56 y.o. male MRN [id number]  Date of birth: 04-15-1966  Office Visit Note: Visit Date: 10/05/2021 PCP: Shirline Frees, MD Referred by: Shirline Frees, MD  Subjective: Chief Complaint  Patient presents with   Lower Back - Pain   Right Leg - Pain, Weakness, Numbness   Left Leg - Pain, Weakness, Numbness   HPI: Scott [name] is a 56 y.o. male who comes in today at the request of Dr. Meridee Score for evaluation of chronic, worsening and severe right sided lower back pain radiating down posterior leg to heel of foot. Patient also reports chronic issues with numbness/tingling to bilateral posterior knees radiating down to feet, patient states he is concerned this could be peripheral neuropathy. Patient reports symptoms are exacerbated by walking, standing and prolonged sitting. He describes pain as sharp, shooting and burning sensation, currently rates as 7 out of 10. Patient reports some relief of pain with home exercise regimen and use of medications. Patient is currently taking Norco as needed for moderate/severe pain prescribed by his primary care provider Dr. Veverly Fells. Patient is also currently undergoing chiropractic treatments with Dr. Jackey Loge in [place], [place] and reports some relief of pain with these therapies. Patients lumbar MRI from 2021 exhibits right subarticular disc protrusion at L2-L3 with resultant moderate right lateral recess and foraminal stenosis, could affect the right L2 and L3 nerve roots. Moderate sized disc protrusion/extrusion at L3-L4 that could affect the right L3 nerve root, left foraminal disc protrusion at L4-L5 that could be affecting left L4 nerve root, and left sided disc bulge at L5-S1 causing moderate left L5 foraminal stenosis. No high grade spinal canal stenosis. Patient has history of diabetes mellitus, last A1C in 2021 was 6.6. Patient states he is a very active person and does work as Geophysicist/field seismologist. Patient states  his pain becomes so severe that he feels like his legs "give out." Patient had left knee arthroscopy with partial medial meniscectomy performed by Dr. Dorna Leitz in 2021, denies current issues and reports good recovery from procedure. Patient denies recent trauma or falls.    Review of Systems  Musculoskeletal:  Positive for back pain.  Neurological:  Positive for tingling. Negative for sensory change, focal weakness and weakness.  All other systems reviewed and are negative.  Otherwise per HPI.  Assessment & Plan: Visit Diagnoses:    ICD-10-CM   1. Lumbar radiculopathy  M54.16 Ambulatory referral to Physical Medicine Rehab    2. Intervertebral disc disorders with radiculopathy, lumbar region  M51.16 Ambulatory referral to Physical Medicine Rehab    3. Other spondylosis with radiculopathy, lumbar region  M47.26 Ambulatory referral to Physical Medicine Rehab    4. Paresthesia of skin  R20.2 Ambulatory referral to Physical Medicine Rehab       Plan: Findings:  Chronic, worsening and severe right sided lower back pain radiating down posterior leg to heel of foot and numbness/tingling to bilateral posterior knees down to feet. Patient continues to have excruciating and debilitating pain despite good conservative therapies such as home exercise regimen, chiropractic treatments and use of medications. Patients clinical presentation and exam are consistent with right S1 nerve pattern. We also can't completely rule out peripheral neuropathy at this time. We feel the next step is to perform diagnostic and hopefully therapeutic right L5-S1 interlaminar epidural steroid injection under fluoroscopic guidance. Patient is not currently on long term anticoagulant therapy. Epidural steroid injection procedure discussed with patient in detail today. We also discussed medication management with  patient and will look at increasing dose of Gabapentin as needed. Patient encouraged to remain active and to continue  with chiropractic treatments as tolerated. No red flag symptoms noted upon exam today.     Meds & Orders: No orders of the defined types were placed in this encounter.   Orders Placed This Encounter  Procedures   Ambulatory referral to Physical Medicine Rehab    Follow-up: Return for Right L5-S1 interlaminar epidural steroid injection.   Procedures: No procedures performed      Clinical History: MRI LUMBAR SPINE WITHOUT CONTRAST     COMPARISON:  None available.   FINDINGS: Segmentation: Standard. Lowest well-formed disc space labeled the L5-S1 level.   Alignment: Moderate levoscoliosis with straightening of the normal lumbar lordosis. 2-3 mm retrolisthesis of L1 on L2 and L2 on L3.   Vertebrae: Vertebral body height maintained without evidence for acute or chronic fracture. Bone marrow signal intensity within normal limits. Subcentimeter benign hemangioma noted within the L5 vertebral body. No other discrete or worrisome osseous lesions. Discogenic reactive endplate changes present about the L2-3 interspace related to scoliotic curvature. No other abnormal marrow edema.   Conus medullaris and cauda equina: Conus extends to the L1 level. Conus and cauda equina appear normal.   Paraspinal and other soft tissues: Unremarkable.   Disc levels:   T11-12: Mild diffuse disc bulge with disc desiccation. Right greater than left facet hypertrophy. No spinal stenosis. Mild left with mild-to-moderate right foraminal narrowing.   T12-L1: Mild diffuse disc bulge with disc desiccation. No significant spinal stenosis. Foramina remain patent.   L1-2: Trace retrolisthesis. Diffuse disc bulge with disc desiccation and intervertebral disc space narrowing. Disc bulging eccentric to the left with mild reactive endplate changes. Moderate right with mild left facet hypertrophy. No significant spinal stenosis. Foramina remain patent.   L2-3: Retrolisthesis. Diffuse disc bulge with disc  desiccation and intervertebral disc space narrowing. Disc bulging eccentric to the right with associated right-sided reactive marginal endplate spurring. Superimposed small right subarticular disc protrusion extends into the right lateral recess (series 13, images 16, 17). Superimposed mild facet hypertrophy. Resultant moderate right lateral recess narrowing, potentially affecting the descending right L3 nerve root. Central canal remains patent. Moderate right L2 foraminal stenosis. No significant left foraminal narrowing.   L3-4: Diffuse disc bulge with disc desiccation and intervertebral disc space narrowing. Superimposed reactive endplate changes. There is a probable moderate-sized right extraforaminal disc protrusion/extrusion, which could potentially affect the exiting right L3 nerve root (series 13, image 24). Moderate bilateral facet hypertrophy. Resultant mild lateral recess narrowing bilaterally, greater on the right. Central canal remains patent. Moderate right with mild to moderate left L3 foraminal stenosis.   L4-5: Diffuse disc bulge with disc desiccation. Mild endplate changes. Superimposed small left foraminal disc protrusion closely approximates the left L4 nerve root (series 13, image 31). Moderate facet hypertrophy. Mild narrowing of the left lateral recess without significant spinal stenosis. Mild right with moderate left L4 foraminal narrowing.   L5-S1: Mild disc bulge with disc desiccation. Superimposed small central disc protrusion mildly indents the ventral thecal sac. Prominent left-sided reactive endplate changes. Moderate left worse than right facet hypertrophy. No significant spinal stenosis. Moderate left L5 foraminal narrowing. No significant right foraminal encroachment.   IMPRESSION: 1. Right eccentric disc bulge with superimposed right subarticular disc protrusion at L2-3 with resultant moderate right lateral recess and foraminal stenosis. Either  the right L2 or L3 nerve roots could be affected. 2. Moderate-sized right extraforaminal disc protrusion/extrusion at L3-4,  which could also affect the right L3 nerve root. 3. Small left foraminal disc protrusion at L4-5, closely approximating and potentially irritating the left L4 nerve root. 4. Left eccentric disc bulge with facet hypertrophy at L5-S1 with resultant moderate left L5 foraminal stenosis.     Electronically Signed   By: Jeannine Boga M.D.   On: 10/29/2019 03:32   He reports that he has never smoked. He has never used smokeless tobacco. No results for input(s): "HGBA1C", "LABURIC" in the last 8760 hours.  Objective:  VS:  HT:    WT:   BMI:     BP:121/78  HR:86bpm  TEMP: ( )  RESP:  Physical Exam Vitals and nursing note reviewed.  HENT:     Head: Normocephalic and atraumatic.     Right Ear: External ear normal.     Left Ear: External ear normal.     Nose: Nose normal.     Mouth/Throat:     Mouth: Mucous membranes are moist.  Eyes:     Extraocular Movements: Extraocular movements intact.  Cardiovascular:     Rate and Rhythm: Normal rate.     Pulses: Normal pulses.  Pulmonary:     Effort: Pulmonary effort is normal.  Abdominal:     General: Abdomen is flat. There is no distension.  Musculoskeletal:        General: Tenderness present.     Cervical back: Normal range of motion.     Comments: Pt rises from seated position to standing without difficulty. Good lumbar range of motion. Strong distal strength without clonus, no pain upon palpation of greater trochanters. Dysesthesias noted to right S1 dermatome. Sensation intact bilaterally. Walks independently, gait steady.   Skin:    General: Skin is warm and dry.     Capillary Refill: Capillary refill takes less than 2 seconds.  Neurological:     General: No focal deficit present.     Mental Status: He is alert and oriented to person, place, and time.  Psychiatric:        Mood and Affect: Mood normal.         Behavior: Behavior normal.     Ortho Exam  Imaging: No results found.  Past Medical/Family/Surgical/Social History: Medications & Allergies reviewed per EMR, new medications updated. Patient Active Problem List   Diagnosis Date Noted   Palpitations 02/21/2016   Past Medical History:  Diagnosis Date   Anemia    Anxiety    Arthritis    Back pain    pinched nerve sciatica   Diabetes mellitus without complication (HCC)    type  2   Elevated cholesterol with elevated triglycerides    Elevated liver enzymes 2014   normal now   GERD (gastroesophageal reflux disease)    Hypertension    Hypertension    Sleep apnea    uses cpap,setting of 14   Family History  Problem Relation Age of Onset   Diabetes Mother    Hypertension Mother    Hyperlipidemia Mother    Cancer Father    Thyroid disease Sister    Past Surgical History:  Procedure Laterality Date   CATARACT EXTRACTION Left    COLONOSCOPY N/A 09/29/2012   Procedure: COLONOSCOPY;  Surgeon: Juanita Craver, MD;  Location: [facility];  Service: Endoscopy;  Laterality: N/A;   COLONOSCOPY WITH PROPOFOL N/A 06/22/2016   Procedure: COLONOSCOPY WITH PROPOFOL;  Surgeon: Carol Ada, MD;  Location: [facility];  Service: Endoscopy;  Laterality: N/A;   COLONOSCOPY WITH PROPOFOL  N/A 05/20/2020   Procedure: COLONOSCOPY WITH PROPOFOL;  Surgeon: Carol Ada, MD;  Location: [facility];  Service: Endoscopy;  Laterality: N/A;   ENTEROSCOPY N/A 05/20/2020   Procedure: ENTEROSCOPY;  Surgeon: Carol Ada, MD;  Location: [facility];  Service: Endoscopy;  Laterality: N/A;   ESOPHAGOGASTRODUODENOSCOPY N/A 09/29/2012   Procedure: ESOPHAGOGASTRODUODENOSCOPY (EGD);  Surgeon: Juanita Craver, MD;  Location: [facility];  Service: Endoscopy;  Laterality: N/A;   ESOPHAGOGASTRODUODENOSCOPY (EGD) WITH PROPOFOL N/A 06/22/2016   Procedure: ESOPHAGOGASTRODUODENOSCOPY (EGD) WITH PROPOFOL;  Surgeon: Carol Ada, MD;  Location: [facility];   Service: Endoscopy;  Laterality: N/A;   FRACTURE SURGERY     right wrist with screws   HERNIA REPAIR  2008   inguinal   KNEE ARTHROSCOPY Left 06/24/2020   Procedure: ARTHROSCOPY KNEE WITH PARTIAL MEDIAL MENISCECTOMY, CHONDROPLASTY,  OPEN CYSTECTOMY;  Surgeon: Dorna Leitz, MD;  Location: [facility];  Service: Orthopedics;  Laterality: Left;   Social History   Occupational History   Not on file  Tobacco Use   Smoking status: Never   Smokeless tobacco: Never  Vaping Use   Vaping Use: Never used  Substance and Sexual Activity   Alcohol use: Yes    Comment: rarely   Drug use: Yes    Types: Marijuana    Comment: rare marijuana use only last used 3 months ago   Sexual activity: Yes

## 2021-10-17 ENCOUNTER — Encounter (INDEPENDENT_AMBULATORY_CARE_PROVIDER_SITE_OTHER): Payer: Self-pay | Admitting: *Deleted

## 2021-10-30 ENCOUNTER — Other Ambulatory Visit: Payer: Self-pay

## 2021-10-30 ENCOUNTER — Encounter: Payer: Self-pay | Admitting: Physical Medicine and Rehabilitation

## 2021-10-30 ENCOUNTER — Ambulatory Visit (INDEPENDENT_AMBULATORY_CARE_PROVIDER_SITE_OTHER): Payer: 59 | Admitting: Physical Medicine and Rehabilitation

## 2021-10-30 ENCOUNTER — Ambulatory Visit: Payer: Self-pay

## 2021-10-30 VITALS — BP 116/79 | HR 78

## 2021-10-30 DIAGNOSIS — M5416 Radiculopathy, lumbar region: Secondary | ICD-10-CM | POA: Diagnosis not present

## 2021-10-30 MED ORDER — METHYLPREDNISOLONE ACETATE 80 MG/ML IJ SUSP
80.0000 mg | Freq: Once | INTRAMUSCULAR | Status: AC
  Administered 2021-10-30: 80 mg

## 2021-10-30 NOTE — Patient Instructions (Signed)

## 2021-10-30 NOTE — Progress Notes (Signed)
Pt state lower back pain that travels down both legs and knees. Pt state he standing for a long time for work. Pt state he takes pain meds to help ease his pain. ? ?Numeric Pain Rating Scale and Functional Assessment ?Average Pain 6 ? ? ?In the last MONTH (on 0-10 scale) has pain interfered with the following? ? ?1. General activity like being  able to carry out your everyday physical activities such as walking, climbing stairs, carrying groceries, or moving a chair?  ?Rating(8) ? ? ?+Driver, -BT, -Dye Allergies. ? ?

## 2021-10-31 NOTE — Progress Notes (Signed)
? ?Scott [name] - 56 y.o. male MRN [id number]  Date of birth: 03-04-1966 ? ?Office Visit Note: ?Visit Date: 10/30/2021 ?PCP: Shirline Frees, MD ?Referred by: Shirline Frees, MD ? ?Subjective: ?Chief Complaint  ?Patient presents with  ? Lower Back - Pain  ? Right Leg - Pain  ? Left Leg - Pain  ? Right Knee - Pain  ? Left Knee - Pain  ? ?HPI:  Scott [name] is a 56 y.o. male who comes in today at the request of Barnet Pall, FNP for planned Right L5-S1 Lumbar Interlaminar epidural steroid injection with fluoroscopic guidance.  The patient has failed conservative care including home exercise, medications, time and activity modification.  This injection will be diagnostic and hopefully therapeutic.  Please see requesting physician notes for further details and justification. ? ?ROS Otherwise per HPI. ? ?Assessment & Plan: ?Visit Diagnoses:  ?  ICD-10-CM   ?1. Lumbar radiculopathy  M54.16 XR C-ARM NO REPORT  ?  Epidural Steroid injection  ?  methylPREDNISolone acetate (DEPO-MEDROL) injection 80 mg  ?  ?  ?Plan: No additional findings.  ? ?Meds & Orders:  ?Meds ordered this encounter  ?Medications  ? methylPREDNISolone acetate (DEPO-MEDROL) injection 80 mg  ?  ?Orders Placed This Encounter  ?Procedures  ? XR C-ARM NO REPORT  ? Epidural Steroid injection  ?  ?Follow-up: Return if symptoms worsen or fail to improve.  ? ?Procedures: ?No procedures performed  ?Lumbar Epidural Steroid Injection - Interlaminar Approach with Fluoroscopic Guidance ? ?Patient: Scott [name]      ?Date of Birth: 1966-04-11 ?MRN: [id number] ?PCP: Shirline Frees, MD      ?Visit Date: 10/30/2021 ?  ?Universal Protocol:    ? ?Consent Given By: the patient ? ?Position: PRONE ? ?Additional Comments: ?Vital signs were monitored before and after the procedure. ?Patient was prepped and draped in the usual sterile fashion. ?The correct patient, procedure, and site was verified. ? ? ?Injection Procedure Details:  ? ?Procedure diagnoses:  Lumbar radiculopathy [M54.16]  ? ?Meds Administered:  ?Meds ordered this encounter  ?Medications  ? methylPREDNISolone acetate (DEPO-MEDROL) injection 80 mg  ?  ? ?Laterality: Right ? ?Location/Site:  L5-S1 ? ?Needle: 3.5 in., 20 ga. Tuohy ? ?Needle Placement: Paramedian epidural ? ?Findings:  ? -Comments: Excellent flow of contrast into the epidural space. ? ?Procedure Details: ?Using a paramedian approach from the side mentioned above, the region overlying the inferior lamina was localized under fluoroscopic visualization and the soft tissues overlying this structure were infiltrated with 4 ml. of 1% Lidocaine without Epinephrine. The Tuohy needle was inserted into the epidural space using a paramedian approach.  ? ?The epidural space was localized using loss of resistance along with counter oblique bi-planar fluoroscopic views.  After negative aspirate for air, blood, and CSF, a 2 ml. volume of Isovue-250 was injected into the epidural space and the flow of contrast was observed. Radiographs were obtained for documentation purposes.   ? ?The injectate was administered into the level noted above. ? ? ?Additional Comments:  ?The patient tolerated the procedure well ?Dressing: 2 x 2 sterile gauze and Band-Aid ?  ? ?Post-procedure details: ?Patient was observed during the procedure. ?Post-procedure instructions were reviewed. ? ?Patient left the clinic in stable condition.   ? ?Clinical History: ?MRI LUMBAR SPINE WITHOUT CONTRAST ?  ?  ?COMPARISON:  None available. ?  ?FINDINGS: ?Segmentation: Standard. Lowest well-formed disc space labeled the ?L5-S1 level. ?  ?Alignment: Moderate levoscoliosis with straightening of the normal ?lumbar lordosis. 2-3 mm  retrolisthesis of L1 on L2 and L2 on L3. ?  ?Vertebrae: Vertebral body height maintained without evidence for ?acute or chronic fracture. Bone marrow signal intensity within ?normal limits. Subcentimeter benign hemangioma noted within the L5 ?vertebral body. No other  discrete or worrisome osseous lesions. ?Discogenic reactive endplate changes present about the L2-3 ?interspace related to scoliotic curvature. No other abnormal marrow ?edema. ?  ?Conus medullaris and cauda equina: Conus extends to the L1 level. ?Conus and cauda equina appear normal. ?  ?Paraspinal and other soft tissues: Unremarkable. ?  ?Disc levels: ?  ?T11-12: Mild diffuse disc bulge with disc desiccation. Right greater ?than left facet hypertrophy. No spinal stenosis. Mild left with ?mild-to-moderate right foraminal narrowing. ?  ?T12-L1: Mild diffuse disc bulge with disc desiccation. No ?significant spinal stenosis. Foramina remain patent. ?  ?L1-2: Trace retrolisthesis. Diffuse disc bulge with disc desiccation ?and intervertebral disc space narrowing. Disc bulging eccentric to ?the left with mild reactive endplate changes. Moderate right with ?mild left facet hypertrophy. No significant spinal stenosis. ?Foramina remain patent. ?  ?L2-3: Retrolisthesis. Diffuse disc bulge with disc desiccation and ?intervertebral disc space narrowing. Disc bulging eccentric to the ?right with associated right-sided reactive marginal endplate ?spurring. Superimposed small right subarticular disc protrusion ?extends into the right lateral recess (series 13, images 16, 17). ?Superimposed mild facet hypertrophy. Resultant moderate right ?lateral recess narrowing, potentially affecting the descending right ?L3 nerve root. Central canal remains patent. Moderate right L2 ?foraminal stenosis. No significant left foraminal narrowing. ?  ?L3-4: Diffuse disc bulge with disc desiccation and intervertebral ?disc space narrowing. Superimposed reactive endplate changes. There ?is a probable moderate-sized right extraforaminal disc ?protrusion/extrusion, which could potentially affect the exiting ?right L3 nerve root (series 13, image 24). Moderate bilateral facet ?hypertrophy. Resultant mild lateral recess narrowing bilaterally, ?greater  on the right. Central canal remains patent. Moderate right ?with mild to moderate left L3 foraminal stenosis. ?  ?L4-5: Diffuse disc bulge with disc desiccation. Mild endplate ?changes. Superimposed small left foraminal disc protrusion closely ?approximates the left L4 nerve root (series 13, image 31). Moderate ?facet hypertrophy. Mild narrowing of the left lateral recess without ?significant spinal stenosis. Mild right with moderate left L4 ?foraminal narrowing. ?  ?L5-S1: Mild disc bulge with disc desiccation. Superimposed small ?central disc protrusion mildly indents the ventral thecal sac. ?Prominent left-sided reactive endplate changes. Moderate left worse ?than right facet hypertrophy. No significant spinal stenosis. ?Moderate left L5 foraminal narrowing. No significant right foraminal ?encroachment. ?  ?IMPRESSION: ?1. Right eccentric disc bulge with superimposed right subarticular ?disc protrusion at L2-3 with resultant moderate right lateral recess ?and foraminal stenosis. Either the right L2 or L3 nerve roots could ?be affected. ?2. Moderate-sized right extraforaminal disc protrusion/extrusion at ?L3-4, which could also affect the right L3 nerve root. ?3. Small left foraminal disc protrusion at L4-5, closely ?approximating and potentially irritating the left L4 nerve root. ?4. Left eccentric disc bulge with facet hypertrophy at L5-S1 with ?resultant moderate left L5 foraminal stenosis. ?  ?  ?Electronically Signed ?  By: Jeannine Boga M.D. ?  On: 10/29/2019 03:32  ? ? ? ?Objective:  VS:  HT:    WT:   BMI:     BP:116/79  HR:78bpm  TEMP: ( )  RESP:  ?Physical Exam ?Vitals and nursing note reviewed.  ?Constitutional:   ?   General: He is not in acute distress. ?   Appearance: Normal appearance. He is obese. He is not ill-appearing.  ?HENT:  ?  Head: Normocephalic and atraumatic.  ?   Right Ear: External ear normal.  ?   Left Ear: External ear normal.  ?   Nose: No congestion.  ?Eyes:  ?    Extraocular Movements: Extraocular movements intact.  ?Cardiovascular:  ?   Rate and Rhythm: Normal rate.  ?   Pulses: Normal pulses.  ?Pulmonary:  ?   Effort: Pulmonary effort is normal. No respiratory distress.

## 2021-10-31 NOTE — Procedures (Signed)
Lumbar Epidural Steroid Injection - Interlaminar Approach with Fluoroscopic Guidance ? ?Patient: Scott [name]      ?Date of Birth: 1966-08-01 ?MRN: [id number] ?PCP: Shirline Frees, MD      ?Visit Date: 10/30/2021 ?  ?Universal Protocol:    ? ?Consent Given By: the patient ? ?Position: PRONE ? ?Additional Comments: ?Vital signs were monitored before and after the procedure. ?Patient was prepped and draped in the usual sterile fashion. ?The correct patient, procedure, and site was verified. ? ? ?Injection Procedure Details:  ? ?Procedure diagnoses: Lumbar radiculopathy [M54.16]  ? ?Meds Administered:  ?Meds ordered this encounter  ?Medications  ? methylPREDNISolone acetate (DEPO-MEDROL) injection 80 mg  ?  ? ?Laterality: Right ? ?Location/Site:  L5-S1 ? ?Needle: 3.5 in., 20 ga. Tuohy ? ?Needle Placement: Paramedian epidural ? ?Findings:  ? -Comments: Excellent flow of contrast into the epidural space. ? ?Procedure Details: ?Using a paramedian approach from the side mentioned above, the region overlying the inferior lamina was localized under fluoroscopic visualization and the soft tissues overlying this structure were infiltrated with 4 ml. of 1% Lidocaine without Epinephrine. The Tuohy needle was inserted into the epidural space using a paramedian approach.  ? ?The epidural space was localized using loss of resistance along with counter oblique bi-planar fluoroscopic views.  After negative aspirate for air, blood, and CSF, a 2 ml. volume of Isovue-250 was injected into the epidural space and the flow of contrast was observed. Radiographs were obtained for documentation purposes.   ? ?The injectate was administered into the level noted above. ? ? ?Additional Comments:  ?The patient tolerated the procedure well ?Dressing: 2 x 2 sterile gauze and Band-Aid ?  ? ?Post-procedure details: ?Patient was observed during the procedure. ?Post-procedure instructions were reviewed. ? ?Patient left the clinic in stable  condition.  ?

## 2021-12-25 ENCOUNTER — Ambulatory Visit (INDEPENDENT_AMBULATORY_CARE_PROVIDER_SITE_OTHER): Payer: 59 | Admitting: Gastroenterology

## 2022-01-12 ENCOUNTER — Telehealth: Payer: Self-pay | Admitting: Physical Medicine and Rehabilitation

## 2022-01-12 NOTE — Telephone Encounter (Signed)
Patient called. He would like to [facility] his appointment. [phone]

## 2022-01-16 ENCOUNTER — Encounter: Payer: Self-pay | Admitting: Physical Medicine and Rehabilitation

## 2022-01-16 ENCOUNTER — Ambulatory Visit: Payer: 59 | Admitting: Physical Medicine and Rehabilitation

## 2022-01-16 VITALS — BP 113/75 | HR 69

## 2022-01-16 DIAGNOSIS — M25561 Pain in right knee: Secondary | ICD-10-CM

## 2022-01-16 DIAGNOSIS — M4726 Other spondylosis with radiculopathy, lumbar region: Secondary | ICD-10-CM | POA: Diagnosis not present

## 2022-01-16 DIAGNOSIS — M5416 Radiculopathy, lumbar region: Secondary | ICD-10-CM | POA: Diagnosis not present

## 2022-01-16 DIAGNOSIS — M5116 Intervertebral disc disorders with radiculopathy, lumbar region: Secondary | ICD-10-CM

## 2022-01-16 DIAGNOSIS — M25562 Pain in left knee: Secondary | ICD-10-CM

## 2022-01-16 DIAGNOSIS — R202 Paresthesia of skin: Secondary | ICD-10-CM | POA: Diagnosis not present

## 2022-01-16 DIAGNOSIS — G8929 Other chronic pain: Secondary | ICD-10-CM

## 2022-01-16 NOTE — Progress Notes (Unsigned)
Pt state lower back pain that travels down both legs and knees. Pt state he standing for a long time for work. Pt state he takes pain meds to help ease his pain. Pt has hx of inj on 10/30/21 pt state it helped and lasted two months. Pt state his knees still has pain and weakness.  Numeric Pain Rating Scale and Functional Assessment Average Pain 9 Pain Right Now 7 My pain is intermittent, sharp, stabbing, tingling, and aching Pain is worse with: walking, bending, standing, and some activites Pain improves with: heat/ice, medication, and injections   In the last MONTH (on 0-10 scale) has pain interfered with the following?  1. General activity like being  able to carry out your everyday physical activities such as walking, climbing stairs, carrying groceries, or moving a chair?  Rating(5)  2. Relation with others like being able to carry out your usual social activities and roles such as  activities at home, at work and in your community. Rating(6)  3. Enjoyment of life such that you have  been bothered by emotional problems such as feeling anxious, depressed or irritable?  Rating(7)

## 2022-01-16 NOTE — Progress Notes (Unsigned)
Khanh Tanori - 56 y.o. male MRN [id number]  Date of birth: 1965-09-09  Office Visit Note: Visit Date: 01/16/2022 PCP: Shirline Frees, MD Referred by: Shirline Frees, MD  Subjective: Chief Complaint  Patient presents with   Lower Back - Pain   Right Knee - Pain   Left Knee - Pain   HPI: Yann Biehn is a 56 y.o. male who comes in today for evaluation of chronic, worsening and severe bilateral leg pain radiating to knees and down to feet, left greater than right. Patient also reports continued numbness/tingling to feet. Patient reports symptoms are exacerbated by walking, standing and prolonged sitting.  Patient reports some relief of pain with home exercise regimen, rest and use medications. Patient has attended chiropractic treatments in the past with Dr. Jackey Loge in [place], [place] and reports some relief of pain with these therapies. Patients lumbar MRI from 2021 exhibits right subarticular disc protrusion at L2-L3 with resultant moderate right lateral recess and foraminal stenosis, could affect the right L2 and L3 nerve roots. Moderate sized disc protrusion/extrusion at L3-L4 that could affect the right L3 nerve root, left foraminal disc protrusion at L4-L5 that could be affecting left L4 nerve root, and left sided disc bulge at L5-S1 causing moderate left L5 foraminal stenosis. No high grade spinal canal stenosis. Patient states he is a very active person, currently works as Geophysicist/field seismologist and is standing/walking for prolonged periods of time. Patient also states he is actively working on weight loss. Patient had right L5-S1 interlaminar epidural steroid injection performed in our office on 10/30/2021 and reports significant relief pain for 2 months. Patient denies focal weakness. Patient denies recent trauma or falls.   Patient states continued issues with bilateral knee pain, left greater than right. Reports his knees feel as if they are going to "give out" when  walking, states increased pain when walking up stairs. MRI of left knee from 2021 exhibits horizontal undersurface tear of the medial meniscus body extending to the anterior and posterior horn junctions. Patient had left knee arthroscopy with partial medial meniscectomy performed by Dr. Dorna Leitz in 2021. Patient reports recent lumbar epidural steroid injection seemed to help bilateral knee pain, however the discomfort has returned. Patient has been seen in the past by Dr. Meridee Score in our office.    Review of Systems  Musculoskeletal:  Positive for joint pain.  Neurological:  Positive for tingling. Negative for sensory change, focal weakness and weakness.  All other systems reviewed and are negative.  Otherwise per HPI.  Assessment & Plan: Visit Diagnoses:    ICD-10-CM   1. Lumbar radiculopathy  M54.16 Ambulatory referral to Physical Medicine Rehab    2. Intervertebral disc disorders with radiculopathy, lumbar region  M51.16 Ambulatory referral to Physical Medicine Rehab    3. Other spondylosis with radiculopathy, lumbar region  M47.26 Ambulatory referral to Physical Medicine Rehab    4. Paresthesia of skin  R20.2 Ambulatory referral to Physical Medicine Rehab    5. Bilateral chronic knee pain  M25.561 Ambulatory referral to Physical Medicine Rehab   M25.562    G89.29        Plan: Findings:  1. Chronic, worsening and severe bilateral leg pain radiating to knees and down to feet, left greater than right. Patient continues to have severe pain despite good conservative therapies such as chiropractic treatments, home exercise regimen, rest and use of medications. Patients clinical presentation and exam are complex, differentials could include lumbar radiculopathy, mechanical knee issues and diabetic  neuropathy. Patient did get significant relief of pain with previous lumbar epidural steroid injection in March. We believe the next step is to perform diagnostic and hopefully therapeutic left  L5-S1 interlaminar epidural steroid injection under fluoroscopic guidance. Patient is not currently taking anticoagulant medications. Patient encouraged to remain active to continue with weight loss regimen.   2. Chronic, worsening and severe pain to bilateral knees, left greater than right. If bilateral knee discomfort continues post lumbar epidural steroid injection I did feel it would be beneficial for him to follow up with Dr. Sharol Given for re-evaluation.     Meds & Orders: No orders of the defined types were placed in this encounter.   Orders Placed This Encounter  Procedures   Ambulatory referral to Physical Medicine Rehab    Follow-up: Return for Left L5-S1 interlaminar epidural steroid injection .   Procedures: No procedures performed      Clinical History: MRI LUMBAR SPINE WITHOUT CONTRAST     COMPARISON:  None available.   FINDINGS: Segmentation: Standard. Lowest well-formed disc space labeled the L5-S1 level.   Alignment: Moderate levoscoliosis with straightening of the normal lumbar lordosis. 2-3 mm retrolisthesis of L1 on L2 and L2 on L3.   Vertebrae: Vertebral body height maintained without evidence for acute or chronic fracture. Bone marrow signal intensity within normal limits. Subcentimeter benign hemangioma noted within the L5 vertebral body. No other discrete or worrisome osseous lesions. Discogenic reactive endplate changes present about the L2-3 interspace related to scoliotic curvature. No other abnormal marrow edema.   Conus medullaris and cauda equina: Conus extends to the L1 level. Conus and cauda equina appear normal.   Paraspinal and other soft tissues: Unremarkable.   Disc levels:   T11-12: Mild diffuse disc bulge with disc desiccation. Right greater than left facet hypertrophy. No spinal stenosis. Mild left with mild-to-moderate right foraminal narrowing.   T12-L1: Mild diffuse disc bulge with disc desiccation. No significant spinal stenosis.  Foramina remain patent.   L1-2: Trace retrolisthesis. Diffuse disc bulge with disc desiccation and intervertebral disc space narrowing. Disc bulging eccentric to the left with mild reactive endplate changes. Moderate right with mild left facet hypertrophy. No significant spinal stenosis. Foramina remain patent.   L2-3: Retrolisthesis. Diffuse disc bulge with disc desiccation and intervertebral disc space narrowing. Disc bulging eccentric to the right with associated right-sided reactive marginal endplate spurring. Superimposed small right subarticular disc protrusion extends into the right lateral recess (series 13, images 16, 17). Superimposed mild facet hypertrophy. Resultant moderate right lateral recess narrowing, potentially affecting the descending right L3 nerve root. Central canal remains patent. Moderate right L2 foraminal stenosis. No significant left foraminal narrowing.   L3-4: Diffuse disc bulge with disc desiccation and intervertebral disc space narrowing. Superimposed reactive endplate changes. There is a probable moderate-sized right extraforaminal disc protrusion/extrusion, which could potentially affect the exiting right L3 nerve root (series 13, image 24). Moderate bilateral facet hypertrophy. Resultant mild lateral recess narrowing bilaterally, greater on the right. Central canal remains patent. Moderate right with mild to moderate left L3 foraminal stenosis.   L4-5: Diffuse disc bulge with disc desiccation. Mild endplate changes. Superimposed small left foraminal disc protrusion closely approximates the left L4 nerve root (series 13, image 31). Moderate facet hypertrophy. Mild narrowing of the left lateral recess without significant spinal stenosis. Mild right with moderate left L4 foraminal narrowing.   L5-S1: Mild disc bulge with disc desiccation. Superimposed small central disc protrusion mildly indents the ventral thecal sac. Prominent left-sided reactive  endplate  changes. Moderate left worse than right facet hypertrophy. No significant spinal stenosis. Moderate left L5 foraminal narrowing. No significant right foraminal encroachment.   IMPRESSION: 1. Right eccentric disc bulge with superimposed right subarticular disc protrusion at L2-3 with resultant moderate right lateral recess and foraminal stenosis. Either the right L2 or L3 nerve roots could be affected. 2. Moderate-sized right extraforaminal disc protrusion/extrusion at L3-4, which could also affect the right L3 nerve root. 3. Small left foraminal disc protrusion at L4-5, closely approximating and potentially irritating the left L4 nerve root. 4. Left eccentric disc bulge with facet hypertrophy at L5-S1 with resultant moderate left L5 foraminal stenosis.     Electronically Signed   By: Jeannine Boga M.D.   On: 10/29/2019 03:32   He reports that he has never smoked. He has never used smokeless tobacco. No results for input(s): "HGBA1C", "LABURIC" in the last 8760 hours.  Objective:  VS:  HT:    WT:   BMI:     BP:113/75  HR:69bpm  TEMP: ( )  RESP:  Physical Exam Vitals and nursing note reviewed.  HENT:     Head: Normocephalic and atraumatic.     Right Ear: External ear normal.     Left Ear: External ear normal.     Nose: Nose normal.     Mouth/Throat:     Mouth: Mucous membranes are moist.  Eyes:     Extraocular Movements: Extraocular movements intact.  Cardiovascular:     Rate and Rhythm: Normal rate.     Pulses: Normal pulses.  Pulmonary:     Effort: Pulmonary effort is normal.  Abdominal:     General: Abdomen is flat. There is no distension.  Musculoskeletal:        General: Tenderness present.     Cervical back: Normal range of motion.     Comments: Pt rises from seated position to standing without difficulty. Good lumbar range of motion. Strong distal strength without clonus, no pain upon palpation of greater trochanters. Sensation intact  bilaterally. Walks independently, gait steady.   Skin:    General: Skin is warm and dry.     Capillary Refill: Capillary refill takes less than 2 seconds.  Neurological:     General: No focal deficit present.     Mental Status: He is alert and oriented to person, place, and time.  Psychiatric:        Mood and Affect: Mood normal.        Behavior: Behavior normal.     Ortho Exam  Imaging: No results found.  Past Medical/Family/Surgical/Social History: Medications & Allergies reviewed per EMR, new medications updated. Patient Active Problem List   Diagnosis Date Noted   Palpitations 02/21/2016   Past Medical History:  Diagnosis Date   Anemia    Anxiety    Arthritis    Back pain    pinched nerve sciatica   Diabetes mellitus without complication (HCC)    type  2   Elevated cholesterol with elevated triglycerides    Elevated liver enzymes 2014   normal now   GERD (gastroesophageal reflux disease)    Hypertension    Hypertension    Sleep apnea    uses cpap,setting of 14   Family History  Problem Relation Age of Onset   Diabetes Mother    Hypertension Mother    Hyperlipidemia Mother    Cancer Father    Thyroid disease Sister    Past Surgical History:  Procedure Laterality Date   CATARACT  EXTRACTION Left    COLONOSCOPY N/A 09/29/2012   Procedure: COLONOSCOPY;  Surgeon: Juanita Craver, MD;  Location: [facility];  Service: Endoscopy;  Laterality: N/A;   COLONOSCOPY WITH PROPOFOL N/A 06/22/2016   Procedure: COLONOSCOPY WITH PROPOFOL;  Surgeon: Carol Ada, MD;  Location: [facility];  Service: Endoscopy;  Laterality: N/A;   COLONOSCOPY WITH PROPOFOL N/A 05/20/2020   Procedure: COLONOSCOPY WITH PROPOFOL;  Surgeon: Carol Ada, MD;  Location: [facility];  Service: Endoscopy;  Laterality: N/A;   ENTEROSCOPY N/A 05/20/2020   Procedure: ENTEROSCOPY;  Surgeon: Carol Ada, MD;  Location: [facility];  Service: Endoscopy;  Laterality: N/A;   ESOPHAGOGASTRODUODENOSCOPY  N/A 09/29/2012   Procedure: ESOPHAGOGASTRODUODENOSCOPY (EGD);  Surgeon: Juanita Craver, MD;  Location: [facility];  Service: Endoscopy;  Laterality: N/A;   ESOPHAGOGASTRODUODENOSCOPY (EGD) WITH PROPOFOL N/A 06/22/2016   Procedure: ESOPHAGOGASTRODUODENOSCOPY (EGD) WITH PROPOFOL;  Surgeon: Carol Ada, MD;  Location: [facility];  Service: Endoscopy;  Laterality: N/A;   FRACTURE SURGERY     right wrist with screws   HERNIA REPAIR  2008   inguinal   KNEE ARTHROSCOPY Left 06/24/2020   Procedure: ARTHROSCOPY KNEE WITH PARTIAL MEDIAL MENISCECTOMY, CHONDROPLASTY,  OPEN CYSTECTOMY;  Surgeon: Dorna Leitz, MD;  Location: [facility];  Service: Orthopedics;  Laterality: Left;   Social History   Occupational History   Not on file  Tobacco Use   Smoking status: Never   Smokeless tobacco: Never  Vaping Use   Vaping Use: Never used  Substance and Sexual Activity   Alcohol use: Yes    Comment: rarely   Drug use: Yes    Types: Marijuana    Comment: rare marijuana use only last used 3 months ago   Sexual activity: Yes

## 2022-01-24 ENCOUNTER — Telehealth: Payer: Self-pay | Admitting: Physical Medicine and Rehabilitation

## 2022-01-24 NOTE — Telephone Encounter (Signed)
Patient returned call asked for a call back. The number to contact patient is [phone]

## 2022-03-05 ENCOUNTER — Ambulatory Visit: Payer: 59 | Admitting: Physical Medicine and Rehabilitation

## 2022-03-05 ENCOUNTER — Encounter: Payer: Self-pay | Admitting: Physical Medicine and Rehabilitation

## 2022-03-05 ENCOUNTER — Ambulatory Visit: Payer: Self-pay

## 2022-03-05 DIAGNOSIS — M5416 Radiculopathy, lumbar region: Secondary | ICD-10-CM

## 2022-03-05 MED ORDER — METHYLPREDNISOLONE ACETATE 80 MG/ML IJ SUSP
80.0000 mg | Freq: Once | INTRAMUSCULAR | Status: AC
  Administered 2022-03-05: 80 mg

## 2022-03-05 NOTE — Progress Notes (Signed)
Scott [name] - 56 y.o. male MRN [id number]  Date of birth: [birth year]  Office Visit Note: Visit Date: 03/05/2022 PCP: Shirline Frees, MD Referred by: Shirline Frees, MD  Subjective: Chief Complaint  Patient presents with   Lower Back - Pain   Left Leg - Pain   Left Knee - Pain   HPI:  Scott [name] is a 56 y.o. male who comes in today at the request of Barnet Pall, FNP for planned Left L5-S1 Lumbar Interlaminar epidural steroid injection with fluoroscopic guidance.  The patient has failed conservative care including home exercise, medications, time and activity modification.  This injection will be diagnostic and hopefully therapeutic.  Please see requesting physician notes for further details and justification.   ROS Otherwise per HPI.  Assessment & Plan: Visit Diagnoses:    ICD-10-CM   1. Lumbar radiculopathy  M54.16 XR C-ARM NO REPORT    Epidural Steroid injection    methylPREDNISolone acetate (DEPO-MEDROL) injection 80 mg      Plan: No additional findings.   Meds & Orders:  Meds ordered this encounter  Medications   methylPREDNISolone acetate (DEPO-MEDROL) injection 80 mg    Orders Placed This Encounter  Procedures   XR C-ARM NO REPORT   Epidural Steroid injection    Follow-up: Return for visit to requesting provider as needed.   Procedures: No procedures performed  Lumbar Epidural Steroid Injection - Interlaminar Approach with Fluoroscopic Guidance  Patient: Scott [name]      Date of Birth: [birth year] MRN: [id number] PCP: Shirline Frees, MD      Visit Date: 03/05/2022   Universal Protocol:     Consent Given By: the patient  Position: PRONE  Additional Comments: Vital signs were monitored before and after the procedure. Patient was prepped and draped in the usual sterile fashion. The correct patient, procedure, and site was verified.   Injection Procedure Details:   Procedure diagnoses: Lumbar radiculopathy [M54.16]    Meds Administered:  Meds ordered this encounter  Medications   methylPREDNISolone acetate (DEPO-MEDROL) injection 80 mg     Laterality: Left  Location/Site:  L5-S1  Needle: 4.5 in., 20 ga. Tuohy  Needle Placement: Paramedian epidural  Findings:   -Comments: Excellent flow of contrast into the epidural space.  Procedure Details: Using a paramedian approach from the side mentioned above, the region overlying the inferior lamina was localized under fluoroscopic visualization and the soft tissues overlying this structure were infiltrated with 4 ml. of 1% Lidocaine without Epinephrine. The Tuohy needle was inserted into the epidural space using a paramedian approach.   The epidural space was localized using loss of resistance along with counter oblique bi-planar fluoroscopic views.  After negative aspirate for air, blood, and CSF, a 2 ml. volume of Isovue-250 was injected into the epidural space and the flow of contrast was observed. Radiographs were obtained for documentation purposes.    The injectate was administered into the level noted above.   Additional Comments:  The patient tolerated the procedure well Dressing: 2 x 2 sterile gauze and Band-Aid    Post-procedure details: Patient was observed during the procedure. Post-procedure instructions were reviewed.  Patient left the clinic in stable condition.   Clinical History: MRI LUMBAR SPINE WITHOUT CONTRAST     COMPARISON:  None available.   FINDINGS: Segmentation: Standard. Lowest well-formed disc space labeled the L5-S1 level.   Alignment: Moderate levoscoliosis with straightening of the normal lumbar lordosis. 2-3 mm retrolisthesis of L1 on L2 and L2 on L3.   Vertebrae:  Vertebral body height maintained without evidence for acute or chronic fracture. Bone marrow signal intensity within normal limits. Subcentimeter benign hemangioma noted within the L5 vertebral body. No other discrete or worrisome osseous  lesions. Discogenic reactive endplate changes present about the L2-3 interspace related to scoliotic curvature. No other abnormal marrow edema.   Conus medullaris and cauda equina: Conus extends to the L1 level. Conus and cauda equina appear normal.   Paraspinal and other soft tissues: Unremarkable.   Disc levels:   T11-12: Mild diffuse disc bulge with disc desiccation. Right greater than left facet hypertrophy. No spinal stenosis. Mild left with mild-to-moderate right foraminal narrowing.   T12-L1: Mild diffuse disc bulge with disc desiccation. No significant spinal stenosis. Foramina remain patent.   L1-2: Trace retrolisthesis. Diffuse disc bulge with disc desiccation and intervertebral disc space narrowing. Disc bulging eccentric to the left with mild reactive endplate changes. Moderate right with mild left facet hypertrophy. No significant spinal stenosis. Foramina remain patent.   L2-3: Retrolisthesis. Diffuse disc bulge with disc desiccation and intervertebral disc space narrowing. Disc bulging eccentric to the right with associated right-sided reactive marginal endplate spurring. Superimposed small right subarticular disc protrusion extends into the right lateral recess (series 13, images 16, 17). Superimposed mild facet hypertrophy. Resultant moderate right lateral recess narrowing, potentially affecting the descending right L3 nerve root. Central canal remains patent. Moderate right L2 foraminal stenosis. No significant left foraminal narrowing.   L3-4: Diffuse disc bulge with disc desiccation and intervertebral disc space narrowing. Superimposed reactive endplate changes. There is a probable moderate-sized right extraforaminal disc protrusion/extrusion, which could potentially affect the exiting right L3 nerve root (series 13, image 24). Moderate bilateral facet hypertrophy. Resultant mild lateral recess narrowing bilaterally, greater on the right. Central canal  remains patent. Moderate right with mild to moderate left L3 foraminal stenosis.   L4-5: Diffuse disc bulge with disc desiccation. Mild endplate changes. Superimposed small left foraminal disc protrusion closely approximates the left L4 nerve root (series 13, image 31). Moderate facet hypertrophy. Mild narrowing of the left lateral recess without significant spinal stenosis. Mild right with moderate left L4 foraminal narrowing.   L5-S1: Mild disc bulge with disc desiccation. Superimposed small central disc protrusion mildly indents the ventral thecal sac. Prominent left-sided reactive endplate changes. Moderate left worse than right facet hypertrophy. No significant spinal stenosis. Moderate left L5 foraminal narrowing. No significant right foraminal encroachment.   IMPRESSION: 1. Right eccentric disc bulge with superimposed right subarticular disc protrusion at L2-3 with resultant moderate right lateral recess and foraminal stenosis. Either the right L2 or L3 nerve roots could be affected. 2. Moderate-sized right extraforaminal disc protrusion/extrusion at L3-4, which could also affect the right L3 nerve root. 3. Small left foraminal disc protrusion at L4-5, closely approximating and potentially irritating the left L4 nerve root. 4. Left eccentric disc bulge with facet hypertrophy at L5-S1 with resultant moderate left L5 foraminal stenosis.     Electronically Signed   By: Jeannine Boga M.D.   On: 10/29/2019 03:32     Objective:  VS:  HT:    WT:   BMI:     BP:   HR: bpm  TEMP: ( )  RESP:  Physical Exam Vitals and nursing note reviewed.  Constitutional:      General: He is not in acute distress.    Appearance: Normal appearance. He is not ill-appearing.  HENT:     Head: Normocephalic and atraumatic.     Right Ear: External ear normal.  Left Ear: External ear normal.     Nose: No congestion.  Eyes:     Extraocular Movements: Extraocular movements intact.   Cardiovascular:     Rate and Rhythm: Normal rate.     Pulses: Normal pulses.  Pulmonary:     Effort: Pulmonary effort is normal. No respiratory distress.  Abdominal:     General: There is no distension.     Palpations: Abdomen is soft.  Musculoskeletal:        General: No tenderness or signs of injury.     Cervical back: Neck supple.     Right lower leg: No edema.     Left lower leg: No edema.     Comments: Patient has good distal strength without clonus.  Skin:    Findings: No erythema or rash.  Neurological:     General: No focal deficit present.     Mental Status: He is alert and oriented to person, place, and time.     Sensory: No sensory deficit.     Motor: No weakness or abnormal muscle tone.     Coordination: Coordination normal.  Psychiatric:        Mood and Affect: Mood normal.        Behavior: Behavior normal.      Imaging: XR C-ARM NO REPORT  Result Date: 03/05/2022 Please see Notes tab for imaging impression.

## 2022-03-05 NOTE — Patient Instructions (Signed)

## 2022-03-05 NOTE — Progress Notes (Signed)
Pt state lower back pain that travels down left legs and knees. Pt state he standing for a long time for work. Pt state he takes pain meds to help ease his pain.  Numeric Pain Rating Scale and Functional Assessment Average Pain 2   In the last MONTH (on 0-10 scale) has pain interfered with the following?  1. General activity like being  able to carry out your everyday physical activities such as walking, climbing stairs, carrying groceries, or moving a chair?  Rating(9)   +Driver, -BT, -Dye Allergies.

## 2022-03-05 NOTE — Procedures (Signed)
Lumbar Epidural Steroid Injection - Interlaminar Approach with Fluoroscopic Guidance  Patient: Scott [name]      Date of Birth: Sep 21, 1965 MRN: [id number] PCP: Shirline Frees, MD      Visit Date: 03/05/2022   Universal Protocol:     Consent Given By: the patient  Position: PRONE  Additional Comments: Vital signs were monitored before and after the procedure. Patient was prepped and draped in the usual sterile fashion. The correct patient, procedure, and site was verified.   Injection Procedure Details:   Procedure diagnoses: Lumbar radiculopathy [M54.16]   Meds Administered:  Meds ordered this encounter  Medications   methylPREDNISolone acetate (DEPO-MEDROL) injection 80 mg     Laterality: Left  Location/Site:  L5-S1  Needle: 4.5 in., 20 ga. Tuohy  Needle Placement: Paramedian epidural  Findings:   -Comments: Excellent flow of contrast into the epidural space.  Procedure Details: Using a paramedian approach from the side mentioned above, the region overlying the inferior lamina was localized under fluoroscopic visualization and the soft tissues overlying this structure were infiltrated with 4 ml. of 1% Lidocaine without Epinephrine. The Tuohy needle was inserted into the epidural space using a paramedian approach.   The epidural space was localized using loss of resistance along with counter oblique bi-planar fluoroscopic views.  After negative aspirate for air, blood, and CSF, a 2 ml. volume of Isovue-250 was injected into the epidural space and the flow of contrast was observed. Radiographs were obtained for documentation purposes.    The injectate was administered into the level noted above.   Additional Comments:  The patient tolerated the procedure well Dressing: 2 x 2 sterile gauze and Band-Aid    Post-procedure details: Patient was observed during the procedure. Post-procedure instructions were reviewed.  Patient left the clinic in stable  condition.

## 2022-03-10 ENCOUNTER — Emergency Department (HOSPITAL_COMMUNITY): Payer: 59

## 2022-03-10 ENCOUNTER — Ambulatory Visit
Admission: RE | Admit: 2022-03-10 | Discharge: 2022-03-10 | Disposition: A | Discharge status: Home / Self Care | Payer: 59 | Source: Ambulatory Visit | Attending: Family Medicine | Admitting: Family Medicine

## 2022-03-10 ENCOUNTER — Encounter (HOSPITAL_COMMUNITY): Payer: Self-pay

## 2022-03-10 ENCOUNTER — Emergency Department (HOSPITAL_COMMUNITY)
Admission: EM | Admit: 2022-03-10 | Discharge: 2022-03-10 | Disposition: A | Discharge status: Home / Self Care | Payer: 59 | Attending: Student | Admitting: Student

## 2022-03-10 VITALS — BP 113/73 | HR 70 | Temp 98.6°F | Resp 18

## 2022-03-10 DIAGNOSIS — I10 Essential (primary) hypertension: Secondary | ICD-10-CM | POA: Insufficient documentation

## 2022-03-10 DIAGNOSIS — Z7984 Long term (current) use of oral hypoglycemic drugs: Secondary | ICD-10-CM | POA: Insufficient documentation

## 2022-03-10 DIAGNOSIS — R519 Headache, unspecified: Secondary | ICD-10-CM

## 2022-03-10 DIAGNOSIS — Z79899 Other long term (current) drug therapy: Secondary | ICD-10-CM | POA: Insufficient documentation

## 2022-03-10 DIAGNOSIS — E119 Type 2 diabetes mellitus without complications: Secondary | ICD-10-CM | POA: Insufficient documentation

## 2022-03-10 LAB — BASIC METABOLIC PANEL
Anion gap: 7 (ref 5–15)
BUN: 14 mg/dL (ref 6–20)
CO2: 29 mmol/L (ref 22–32)
Calcium: 9.3 mg/dL (ref 8.9–10.3)
Chloride: 103 mmol/L (ref 98–111)
Creatinine, Ser: 0.68 mg/dL (ref 0.61–1.24)
GFR, Estimated: >60 mL/min (ref >60)
Glucose, Bld: 142 mg/dL — ABNORMAL HIGH (ref 70–99)
Potassium: 3.3 mmol/L — ABNORMAL LOW (ref 3.5–5.1)
Sodium: 139 mmol/L (ref 135–145)

## 2022-03-10 LAB — CBC
HCT: 46.8 % (ref 39.0–52.0)
Hemoglobin: 15.5 g/dL (ref 13.0–17.0)
MCH: 28.5 pg (ref 26.0–34.0)
MCHC: 33.1 g/dL (ref 30.0–36.0)
MCV: 86 fL (ref 80.0–100.0)
Platelets: 241 10*3/uL (ref 150–400)
RBC: 5.44 MIL/uL (ref 4.22–5.81)
RDW: 12.2 % (ref 11.5–15.5)
WBC: 6.9 10*3/uL (ref 4.0–10.5)
nRBC: 0 % (ref 0.0–0.2)

## 2022-03-10 MED ORDER — PROCHLORPERAZINE EDISYLATE 10 MG/2ML IJ SOLN
10.0000 mg | Freq: Once | INTRAMUSCULAR | Status: AC
  Administered 2022-03-10: 10 mg via INTRAVENOUS
  Filled 2022-03-10: qty 2

## 2022-03-10 MED ORDER — SODIUM CHLORIDE 0.9 % IV BOLUS
1000.0000 mL | Freq: Once | INTRAVENOUS | Status: AC
  Administered 2022-03-10: 1000 mL via INTRAVENOUS

## 2022-03-10 MED ORDER — IOHEXOL 350 MG/ML SOLN
75.0000 mL | Freq: Once | INTRAVENOUS | Status: AC | PRN
  Administered 2022-03-10: 75 mL via INTRAVENOUS

## 2022-03-10 MED ORDER — DIPHENHYDRAMINE HCL 50 MG/ML IJ SOLN
25.0000 mg | Freq: Once | INTRAMUSCULAR | Status: AC
  Administered 2022-03-10: 25 mg via INTRAVENOUS
  Filled 2022-03-10: qty 1

## 2022-03-10 MED ORDER — KETOROLAC TROMETHAMINE 15 MG/ML IJ SOLN
15.0000 mg | Freq: Once | INTRAMUSCULAR | Status: AC
  Administered 2022-03-10: 15 mg via INTRAVENOUS
  Filled 2022-03-10: qty 1

## 2022-03-10 NOTE — Discharge Instructions (Signed)
Your work-up in the ER today was reassuring for any emergent concerns.  I recommend that you follow-up with your primary care doctor next week for further evaluation and management of your symptoms.  Return if development of any new or worsening symptoms.

## 2022-03-10 NOTE — ED Triage Notes (Signed)
Headache x 3 days.  States he has never had a headache like this before.  States lights bother him.  Has tried tylenol and motrin without relief.  States he had an epidural shot done on Monday

## 2022-03-10 NOTE — ED Triage Notes (Signed)
Pt states that he has had a progressively worsening headache since Thursday. Pt endorses photosensitivity. Pt states no relief with tylenol or ibuprofen.

## 2022-03-10 NOTE — ED Notes (Signed)
Patient is being discharged from the Urgent Care and sent to the Emergency Department via private vehicle . Per NP, patient is in need of higher level of care due to headache that patient states is the worse headache of his life. Patient is aware and verbalizes understanding of plan of care.  Vitals:   03/10/22 1418  BP: 113/73  Pulse: 70  Resp: 18  Temp: 98.6 F (37 C)  SpO2: 97%

## 2022-03-10 NOTE — Discharge Instructions (Addendum)
Go to the emergency department for further evaluation

## 2022-03-10 NOTE — ED Provider Notes (Signed)
[facility]    CSN: [id number] Arrival date & time: 03/10/22  1351      History   Chief Complaint Chief Complaint  Patient presents with   Headache    Entered by patient    HPI Scott [name] is a 56 y.o. male.   The history is provided by the patient.   She presents for complaints of headache that been present for the past 3 days.  Patient states "this is the worst headache of my life".  Patient states he is never experienced a headache of this nature before.  Patient complains of lightheadedness, dizziness, photophobia, intermittent shortness of breath, and twitching in his face.  Patient states that he did have a lumbar epidural 2 days prior to his symptoms starting.  He states that he has had epidurals in the past, but has never experienced headaches such as this, although he knows this is a possible side effect.  Patient denies fever, chills, chest pain, difficulty breathing, abdominal pain, nausea, vomiting, or diarrhea.  Patient states that he took ibuprofen, but that has not helped his symptoms at this time.  Past Medical History:  Diagnosis Date   Anemia    Anxiety    Arthritis    Back pain    pinched nerve sciatica   Diabetes mellitus without complication (HCC)    type  2   Elevated cholesterol with elevated triglycerides    Elevated liver enzymes 2014   normal now   GERD (gastroesophageal reflux disease)    Hypertension    Hypertension    Sleep apnea    uses cpap,setting of 14    Patient Active Problem List   Diagnosis Date Noted   Palpitations 02/21/2016    Past Surgical History:  Procedure Laterality Date   CATARACT EXTRACTION Left    COLONOSCOPY N/A 09/29/2012   Procedure: COLONOSCOPY;  Surgeon: Juanita Craver, MD;  Location: [facility];  Service: Endoscopy;  Laterality: N/A;   COLONOSCOPY WITH PROPOFOL N/A 06/22/2016   Procedure: COLONOSCOPY WITH PROPOFOL;  Surgeon: Carol Ada, MD;  Location: [facility];  Service: Endoscopy;   Laterality: N/A;   COLONOSCOPY WITH PROPOFOL N/A 05/20/2020   Procedure: COLONOSCOPY WITH PROPOFOL;  Surgeon: Carol Ada, MD;  Location: [facility];  Service: Endoscopy;  Laterality: N/A;   ENTEROSCOPY N/A 05/20/2020   Procedure: ENTEROSCOPY;  Surgeon: Carol Ada, MD;  Location: [facility];  Service: Endoscopy;  Laterality: N/A;   ESOPHAGOGASTRODUODENOSCOPY N/A 09/29/2012   Procedure: ESOPHAGOGASTRODUODENOSCOPY (EGD);  Surgeon: Juanita Craver, MD;  Location: [facility];  Service: Endoscopy;  Laterality: N/A;   ESOPHAGOGASTRODUODENOSCOPY (EGD) WITH PROPOFOL N/A 06/22/2016   Procedure: ESOPHAGOGASTRODUODENOSCOPY (EGD) WITH PROPOFOL;  Surgeon: Carol Ada, MD;  Location: [facility];  Service: Endoscopy;  Laterality: N/A;   FRACTURE SURGERY     right wrist with screws   HERNIA REPAIR  2008   inguinal   KNEE ARTHROSCOPY Left 06/24/2020   Procedure: ARTHROSCOPY KNEE WITH PARTIAL MEDIAL MENISCECTOMY, CHONDROPLASTY,  OPEN CYSTECTOMY;  Surgeon: Dorna Leitz, MD;  Location: [facility];  Service: Orthopedics;  Laterality: Left;       Home Medications    Prior to Admission medications   Medication Sig Start Date End Date Taking? Authorizing Provider  atenolol-chlorthalidone (TENORETIC) 100-25 MG per tablet Take 1 tablet by mouth daily.    [provider]  atorvastatin (LIPITOR) 20 MG tablet Take 20 mg by mouth every evening.     [provider]  busPIRone (BUSPAR) 10 MG tablet Take 10 mg  by mouth daily.  07/07/15   [provider]  Canagliflozin-Metformin HCl ER (INVOKAMET XR) 50-500 MG TB24 Take 2 tablets by mouth daily before breakfast. Patient taking differently: Take 1 tablet by mouth in the morning and at bedtime. 07/02/16   Philemon Kingdom, MD  ferrous sulfate 325 (65 FE) MG tablet Take 325 mg by mouth daily.    [provider]  fluticasone (FLONASE) 50 MCG/ACT nasal spray Place 1 spray into both nostrils daily for 14 days. 04/06/20 05/12/21  Avegno,  Darrelyn Hillock, FNP  gabapentin (NEURONTIN) 300 MG capsule Take 300 mg by mouth at bedtime.     [provider]  glucose blood test strip Use to check blood sugar once a day. 07/02/16   Philemon Kingdom, MD  HYDROcodone-acetaminophen (NORCO) 10-325 MG tablet Take 1-2 tablets by mouth daily as needed (pain.).  06/06/20   [provider]  hydrOXYzine (ATARAX/VISTARIL) 25 MG tablet Take 25 mg by mouth at bedtime. 07/04/15   [provider]  lisinopril (PRINIVIL,ZESTRIL) 2.5 MG tablet Take 2.5 mg by mouth every evening.     [provider]  LORazepam (ATIVAN) 0.5 MG tablet Take 0.5 mg by mouth at bedtime.    [provider]  meloxicam (MOBIC) 15 MG tablet Take 15 mg by mouth daily. 06/03/20   [provider]  MICROLET LANCETS MISC Use to check blood sugar. 07/02/16   Philemon Kingdom, MD  Multiple Vitamin (MULTIVITAMIN WITH MINERALS) TABS tablet Take 1 tablet by mouth daily. Multivitamin for Adults 50+ (Iron Free)    [provider]  Multiple Vitamins-Minerals (IMMUNE SYSTEM BOOSTER PO) Take 2 tablets by mouth daily. Chewables    [provider]  Omega-3 Fatty Acids (FISH OIL) 1000 MG CAPS Take 1,000 mg by mouth at bedtime.    [provider]  omeprazole (PRILOSEC) 40 MG capsule Take 40 mg by mouth 2 (two) times daily. 05/09/20   [provider]  ondansetron (ZOFRAN-ODT) 4 MG disintegrating tablet Take 1 tablet (4 mg total) by mouth every 8 (eight) hours as needed for nausea or vomiting. 08/01/21   Volney American, PA-C  oseltamivir (TAMIFLU) 75 MG capsule Take 1 capsule (75 mg total) by mouth every 12 (twelve) hours. 08/01/21   Volney American, PA-C  promethazine-dextromethorphan (PROMETHAZINE-DM) 6.25-15 MG/5ML syrup Take 5 mLs by mouth 4 (four) times daily as needed for cough. 10/09/20   Scot Jun, FNP  rifaximin (XIFAXAN) 200 MG tablet Take 200 mg by mouth 3 (three) times daily.    [provider]  RYBELSUS 7 MG TABS Take 7 mg by mouth daily.  04/29/20   [provider]  timolol (TIMOPTIC) 0.5 % ophthalmic solution Place 1 drop into the left eye 2 (two) times daily. 05/06/20   [provider]  tiZANidine (ZANAFLEX) 4 MG tablet Take 4 mg by mouth at bedtime. 04/29/20   [provider]  TURMERIC PO Take 2 capsules by mouth daily.    [provider]  zolpidem (AMBIEN) 10 MG tablet Take 10 mg by mouth at bedtime. 05/06/20   [provider]  liraglutide (VICTOZA) 18 MG/3ML SOPN Inject 1.2 mg into the skin every evening.  11/02/19  [provider]    Family History Family History  Problem Relation Age of Onset   Diabetes Mother    Hypertension Mother    Hyperlipidemia Mother    Cancer Father    Thyroid disease Sister     Social History Social History  Tobacco Use   Smoking status: Never   Smokeless tobacco: Never  Vaping Use   Vaping Use: Never used  Substance Use Topics   Alcohol use: Yes    Comment: rarely   Drug use: Yes    Types: Marijuana    Comment: rare marijuana use only last used 3 months ago     Allergies   Trazodone hcl, Trulicity [dulaglutide], and Metformin and related   Review of Systems Review of Systems Per HPI  Physical Exam Triage Vital Signs ED Triage Vitals  Enc Vitals Group     BP 03/10/22 1418 113/73     Pulse Rate 03/10/22 1418 70     Resp 03/10/22 1418 18     Temp 03/10/22 1418 98.6 F (37 C)     Temp Source 03/10/22 1418 Oral     SpO2 03/10/22 1418 97 %     Weight --      Height --      Head Circumference --      Peak Flow --      Pain Score 03/10/22 1419 8     Pain Loc --      Pain Edu? --      Excl. in [place]? --    No data found.  Updated Vital Signs BP 113/73 (BP Location: Right Arm)   Pulse 70   Temp 98.6 F (37 C) (Oral)   Resp 18   SpO2 97%   Visual Acuity Right Eye Distance:   Left Eye Distance:   Bilateral Distance:    Right Eye Near:   Left  Eye Near:    Bilateral Near:     Physical Exam Vitals and nursing note reviewed.  Constitutional:      General: He is in acute distress (Due to headache pain).     Appearance: He is well-developed.     Comments: Patient is sitting in the exam room with the lights off due to photophobia  HENT:     Head: Normocephalic.  Eyes:     Extraocular Movements: Extraocular movements intact.     Right eye: Normal extraocular motion.     Pupils: Pupils are equal, round, and reactive to light.  Cardiovascular:     Rate and Rhythm: Normal rate and regular rhythm.  Pulmonary:     Effort: Pulmonary effort is normal.     Breath sounds: Normal breath sounds. No wheezing.  Abdominal:     General: Bowel sounds are normal.     Palpations: Abdomen is soft.     Tenderness: There is no abdominal tenderness.  Musculoskeletal:     Cervical back: Normal range of motion. No rigidity.  Skin:    General: Skin is warm and dry.  Neurological:     Mental Status: He is alert and oriented to person, place, and time.     GCS: GCS eye subscore is 4. GCS verbal subscore is 5. GCS motor subscore is 6.     Cranial Nerves: No cranial nerve deficit.     Sensory: No sensory deficit.  Psychiatric:        Mood and Affect: Mood normal. Mood is not anxious.        Behavior: Behavior normal.      UC Treatments / Results  Labs (all labs ordered are listed, but only abnormal results are displayed) Labs Reviewed - No data to display  EKG   Radiology No results found.  Procedures Procedures (including critical care time)  Medications Ordered in  UC Medications - No data to display  Initial Impression / Assessment and Plan / UC Course  I have reviewed the triage vital signs and the nursing notes.  Pertinent labs & imaging results that were available during my care of the patient were reviewed by me and considered in my medical decision making (see chart for details).  Patient presents for complaints of  headache that is been present for the past 3 days.  When this provider walked in the room the exam room lights were off.  Patient states "this is the worst headache of air experience in my life".  His neurological exam is within normal limits at this time.  There is concern for neurological involvement based on his current complaints and symptoms.  Discussion with patient that it would be appropriate for him to go to the emergency department for further evaluation as resources are limited in this setting.  Patient's vital signs are stable, patient able to travel to the ER via private vehicle.  Patient verbalizes understanding. Final Clinical Impressions(s) / UC Diagnoses   Final diagnoses:  Intractable headache, unspecified chronicity pattern, unspecified headache type     Discharge Instructions      Go to the emergency department for further evaluation.     ED Prescriptions   None    PDMP not reviewed this encounter.   Tish Men, NP 03/10/22 1458

## 2022-03-10 NOTE — ED Provider Notes (Signed)
[facility] - Inpatient EMERGENCY DEPARTMENT Provider Note   CSN: [id number] Arrival date & time: 03/10/22  1508     History  Chief Complaint  Patient presents with   Headache    Scott [name] is a 56 y.o. male.  Patient with history of diabetes and hypertension presents today with complaints of headache. He states that on Monday 7/31 he had a lumbar epidural steroid injection without complication for lumbar radiculopathy. States that he felt fine afterwards until Wednesday afternoon when he was working outside in his yard and he developed a gradual headache that he described as severe. States that he has never had a headache like this before. States that the headache is frontal in nature and feels like 'someone is squeezing my brain.' Endorses associated photophobia and phonophobia. Denies any fevers, chills, neck pain, blurred vision, nausea, or vomiting. States that he has had several epidural injections previously but has never had headaches after. Denies any back pain, numbness/tingling, loss of bowel or bladder function, or saddle paresthesias.  States that he took his wife's migraine medication with some relief but it was short-lived.  Additionally, he states that he has been under a significant amount of stress recently and has not been sleeping adequately or drinking enough water.  Also states that he spends his days looking at screens.  Was seen initially at urgent care and sent here for further evaluation.  The history is provided by the patient. No language interpreter was used.  Headache      Home Medications Prior to Admission medications   Medication Sig Start Date End Date Taking? Authorizing Provider  atenolol-chlorthalidone (TENORETIC) 100-25 MG per tablet Take 1 tablet by mouth daily.    [provider]  atorvastatin (LIPITOR) 20 MG tablet Take 20 mg by mouth every evening.     [provider]  busPIRone (BUSPAR) 10 MG tablet Take 10 mg by mouth daily.   07/07/15   [provider]  Canagliflozin-Metformin HCl ER (INVOKAMET XR) 50-500 MG TB24 Take 2 tablets by mouth daily before breakfast. Patient taking differently: Take 1 tablet by mouth in the morning and at bedtime. 07/02/16   Philemon Kingdom, MD  ferrous sulfate 325 (65 FE) MG tablet Take 325 mg by mouth daily.    [provider]  fluticasone (FLONASE) 50 MCG/ACT nasal spray Place 1 spray into both nostrils daily for 14 days. 04/06/20 05/12/21  Avegno, Darrelyn Hillock, FNP  gabapentin (NEURONTIN) 300 MG capsule Take 300 mg by mouth at bedtime.     [provider]  glucose blood test strip Use to check blood sugar once a day. 07/02/16   Philemon Kingdom, MD  HYDROcodone-acetaminophen (NORCO) 10-325 MG tablet Take 1-2 tablets by mouth daily as needed (pain.).  06/06/20   [provider]  hydrOXYzine (ATARAX/VISTARIL) 25 MG tablet Take 25 mg by mouth at bedtime. 07/04/15   [provider]  lisinopril (PRINIVIL,ZESTRIL) 2.5 MG tablet Take 2.5 mg by mouth every evening.     [provider]  LORazepam (ATIVAN) 0.5 MG tablet Take 0.5 mg by mouth at bedtime.    [provider]  meloxicam (MOBIC) 15 MG tablet Take 15 mg by mouth daily. 06/03/20   [provider]  MICROLET LANCETS MISC Use to check blood sugar. 07/02/16   Philemon Kingdom, MD  Multiple Vitamin (MULTIVITAMIN WITH MINERALS) TABS tablet Take 1 tablet by mouth daily. Multivitamin for Adults 50+ (Iron Free)    [provider]  Multiple Vitamins-Minerals (Dillon  PO) Take 2 tablets by mouth daily. Chewables    [provider]  Omega-3 Fatty Acids (FISH OIL) 1000 MG CAPS Take 1,000 mg by mouth at bedtime.    [provider]  omeprazole (PRILOSEC) 40 MG capsule Take 40 mg by mouth 2 (two) times daily. 05/09/20   [provider]  ondansetron (ZOFRAN-ODT) 4 MG disintegrating tablet Take 1 tablet (4 mg total) by mouth every 8 (eight)  hours as needed for nausea or vomiting. 08/01/21   Volney American, PA-C  oseltamivir (TAMIFLU) 75 MG capsule Take 1 capsule (75 mg total) by mouth every 12 (twelve) hours. 08/01/21   Volney American, PA-C  promethazine-dextromethorphan (PROMETHAZINE-DM) 6.25-15 MG/5ML syrup Take 5 mLs by mouth 4 (four) times daily as needed for cough. 10/09/20   Scot Jun, FNP  rifaximin (XIFAXAN) 200 MG tablet Take 200 mg by mouth 3 (three) times daily.    [provider]  RYBELSUS 7 MG TABS Take 7 mg by mouth daily.  04/29/20   [provider]  timolol (TIMOPTIC) 0.5 % ophthalmic solution Place 1 drop into the left eye 2 (two) times daily. 05/06/20   [provider]  tiZANidine (ZANAFLEX) 4 MG tablet Take 4 mg by mouth at bedtime. 04/29/20   [provider]  TURMERIC PO Take 2 capsules by mouth daily.    [provider]  zolpidem (AMBIEN) 10 MG tablet Take 10 mg by mouth at bedtime. 05/06/20   [provider]  liraglutide (VICTOZA) 18 MG/3ML SOPN Inject 1.2 mg into the skin every evening.  11/02/19  [provider]      Allergies    Trazodone hcl, Trulicity [dulaglutide], and Metformin and related    Review of Systems   Review of Systems  Neurological:  Positive for headaches.  All other systems reviewed and are negative.   Physical Exam Updated Vital Signs BP 131/79   Pulse 78   Temp 98.6 F (37 C) (Oral)   Resp 16   Ht '6\' 1"'$  (1.854 m)   Wt 130.6 kg   SpO2 99%   BMI 38.00 kg/m  Physical Exam Vitals and nursing note reviewed.  Constitutional:      General: He is not in acute distress.    Appearance: Normal appearance. He is normal weight. He is not ill-appearing, toxic-appearing or diaphoretic.  HENT:     Head: Normocephalic and atraumatic.  Eyes:     Extraocular Movements: Extraocular movements intact.     Right eye: Normal extraocular motion and no nystagmus.     Left eye: Normal extraocular motion and no  nystagmus.     Pupils: Pupils are equal, round, and reactive to light.     Right eye: Pupil is round and reactive.     Left eye: Pupil is round and reactive.  Neck:     Meningeal: Brudzinski's sign and Kernig's sign absent.     Comments: No meningismus Cardiovascular:     Rate and Rhythm: Normal rate and regular rhythm.     Heart sounds: Normal heart sounds.  Pulmonary:     Effort: Pulmonary effort is normal. No respiratory distress.     Breath sounds: Normal breath sounds.  Abdominal:     Palpations: Abdomen is soft.  Musculoskeletal:        General: Normal range of motion.     Cervical back: Normal range of motion and neck supple. No rigidity.     Comments: No tenderness to palpation over the  lumbar spine, no fluctuance, erythema, or induration.  No overlying skin changes.  Lymphadenopathy:     Cervical: No cervical adenopathy.  Skin:    General: Skin is warm and dry.  Neurological:     General: No focal deficit present.     Mental Status: He is alert and oriented to person, place, and time.     GCS: GCS eye subscore is 4. GCS verbal subscore is 5. GCS motor subscore is 6.     Sensory: Sensation is intact.     Motor: Motor function is intact.     Coordination: Coordination is intact.     Gait: Gait is intact.     Comments: Alert and oriented to self, place, time and event.    Speech is fluent, clear without dysarthria or dysphasia.    Strength 5/5 in upper/lower extremities   Sensation intact in upper/lower extremities   Ambulatory with normal gait   CN I not tested  CN II grossly intact visual fields bilaterally. Did not visualize posterior eye.  CN III, IV, VI PERRLA and EOMs intact bilaterally  CN V Intact sensation to sharp and light touch to the face  CN VII facial movements symmetric  CN VIII not tested  CN IX, X no uvula deviation, symmetric rise of soft palate  CN XI 5/5 SCM and trapezius strength bilaterally  CN XII Midline tongue protrusion, symmetric L/R  movements   Psychiatric:        Mood and Affect: Mood normal.        Behavior: Behavior normal.     ED Results / Procedures / Treatments   Labs (all labs ordered are listed, but only abnormal results are displayed) Labs Reviewed  BASIC METABOLIC PANEL - Abnormal; Notable for the following components:      Result Value   Potassium 3.3 (*)    Glucose, Bld 142 (*)    All other components within normal limits  CBC    EKG None  Radiology CT ANGIO HEAD NECK W WO CM  Result Date: 03/10/2022 CLINICAL DATA:  Sudden onset severe headache EXAM: CT ANGIOGRAPHY HEAD AND NECK TECHNIQUE: Multidetector CT imaging of the head and neck was performed using the standard protocol during bolus administration of intravenous contrast. Multiplanar CT image reconstructions and MIPs were obtained to evaluate the vascular anatomy. Carotid stenosis measurements (when applicable) are obtained utilizing NASCET criteria, using the distal internal carotid diameter as the denominator. RADIATION DOSE REDUCTION: This exam was performed according to the departmental dose-optimization program which includes automated exposure control, adjustment of the mA and/or kV according to patient size and/or use of iterative reconstruction technique. CONTRAST:  18m OMNIPAQUE IOHEXOL 350 MG/ML SOLN COMPARISON:  None Available. FINDINGS: CT HEAD FINDINGS Brain: There is no mass, hemorrhage or extra-axial collection. The size and configuration of the ventricles and extra-axial CSF spaces are normal. There is no acute or chronic infarction. The brain parenchyma is normal. Skull: The visualized skull base, calvarium and extracranial soft tissues are normal. Sinuses/Orbits: No fluid levels or advanced mucosal thickening of the visualized paranasal sinuses. No mastoid or middle ear effusion. The orbits are normal. CTA NECK FINDINGS SKELETON: There is no bony spinal canal stenosis. No lytic or blastic lesion. OTHER NECK: Normal pharynx, larynx and  major salivary glands. No cervical lymphadenopathy. Unremarkable thyroid gland. UPPER CHEST: No pneumothorax or pleural effusion. No nodules or masses. AORTIC ARCH: There is no calcific atherosclerosis of the aortic arch. There is no aneurysm, dissection or hemodynamically significant  stenosis of the visualized portion of the aorta. Conventional 3 vessel aortic branching pattern. The visualized proximal subclavian arteries are widely patent. RIGHT CAROTID SYSTEM: Normal without aneurysm, dissection or stenosis. LEFT CAROTID SYSTEM: Normal without aneurysm, dissection or stenosis. VERTEBRAL ARTERIES: Left dominant configuration. Both origins are clearly patent. There is no dissection, occlusion or flow-limiting stenosis to the skull base (V1-V3 segments). CTA HEAD FINDINGS POSTERIOR CIRCULATION: --Vertebral arteries: Normal V4 segments. --Inferior cerebellar arteries: Normal. --Basilar artery: Normal. --Superior cerebellar arteries: Normal. --Posterior cerebral arteries (PCA): Normal. ANTERIOR CIRCULATION: --Intracranial internal carotid arteries: Normal. --Anterior cerebral arteries (ACA): Normal. Both A1 segments are present. Patent anterior communicating artery (a-comm). --Middle cerebral arteries (MCA): Normal. VENOUS SINUSES: As permitted by contrast timing, patent. ANATOMIC VARIANTS: None Review of the MIP images confirms the above findings. IMPRESSION: Normal CTA of the head and neck. Electronically Signed   By: Ulyses Jarred M.D.   On: 03/10/2022 20:57   CT Head Wo Contrast  Result Date: 03/10/2022 CLINICAL DATA:  Headache, sudden, severe EXAM: CT HEAD WITHOUT CONTRAST TECHNIQUE: Contiguous axial images were obtained from the base of the skull through the vertex without intravenous contrast. RADIATION DOSE REDUCTION: This exam was performed according to the departmental dose-optimization program which includes automated exposure control, adjustment of the mA and/or kV according to patient size and/or use  of iterative reconstruction technique. COMPARISON:  None Available. FINDINGS: Brain: No evidence of acute infarction, hemorrhage, hydrocephalus, extra-axial collection or mass lesion/mass effect. Vascular: No hyperdense vessel or unexpected calcification. Skull: Normal. Negative for fracture or focal lesion. Sinuses/Orbits: No acute finding. Other: None. IMPRESSION: No acute intracranial abnormality. Electronically Signed   By: Valentino Saxon M.D.   On: 03/10/2022 18:07    Procedures Procedures    Medications Ordered in ED Medications  sodium chloride 0.9 % bolus 1,000 mL (0 mLs Intravenous Stopped 03/10/22 1946)  prochlorperazine (COMPAZINE) injection 10 mg (10 mg Intravenous Given 03/10/22 1904)  diphenhydrAMINE (BENADRYL) injection 25 mg (25 mg Intravenous Given 03/10/22 1910)  ketorolac (TORADOL) 15 MG/ML injection 15 mg (15 mg Intravenous Given 03/10/22 1907)  iohexol (OMNIPAQUE) 350 MG/ML injection 75 mL (75 mLs Intravenous Contrast Given 03/10/22 2001)    ED Course/ Medical Decision Making/ A&P                           Medical Decision Making Amount and/or Complexity of Data Reviewed Labs: ordered. Radiology: ordered.  Risk Prescription drug management.   This patient presents to the ED for concern of headache, this involves an extensive number of treatment options, and is a complaint that carries with it a high risk of complications and morbidity.  The differential diagnosis includes spinal headache, CVA, intracranial space occupying lesion, tension headache, dehydration.  This is not an exhaustive differential.   Co morbidities that complicate the patient evaluation  History of diabetes and hypertension   Additional history obtained:  Additional history obtained from epic chart review   Lab Tests:  I Ordered, and personally interpreted labs.  The pertinent results include: K 3.3, glucose 142.  No other acute laboratory findings   Imaging Studies ordered:  I ordered  imaging studies including CT head, CTA head and neck I independently visualized and interpreted imaging which showed  No acute findings I agree with the radiologist interpretation   Problem List / ED Course / Critical interventions / Medication management  I ordered medication including compazine, benadryl, fluids, and toradol  for headache  Reevaluation  of the patient after these medicines showed that the patient resolved I have reviewed the patients home medicines and have made adjustments as needed  Test / Admission - Considered:  Scott [name] presents with headache Given the large differential diagnosis for Scott [name], the decision making in this case is of high complexity.  After evaluating all of the data points in this case, the presentation of Scott [name] is NOT consistent with skull fracture, meningitis/encephalitis, SAH/sentinel bleed, Intracranial Hemorrhage (ICH) (subdural/epidural), acute obstructive hydrocephalus, space occupying lesions, CVA, Basilar/vertebral artery dissection, preeclampsia, cerebral venous thrombosis, hypertensive emergency, temporal Arteritis, Idiopathic Intracranial Hypertension (pseudotumor cerebri).  After fluids and headache cocktail, patient states that his headache has resolved. Given that this headache started about 48 hours following an epidural injection, some concern that this could be a spinal headache, however denies any nausea/vomiting. Also states that his headache is not relieved by lying flat and denies any associated neck pain or stiffness. Also he states that he has been under more stress recently and has not been sleeping well or staying hydration. Given that he describes the headache as squeezing, I have some suspicion that this may actually be a tension headache. He is afebrile, non-toxic appearing, and in no acute distress with reassuring vital signs.  He is also alert and oriented and neurologically intact without  focal deficits.  He also denies any back pain, numbness/tingling in his extremities, weakness, fevers, or chills.  No concern for cauda equina.  Physical exam does not reveal any fluctuance or induration or overlying skin changes that would be concerning for an abscess or CSF leak.  Given that his symptoms improved with headache cocktail, no further emergent concerns at this time.  He is stable for discharge.  Recommended close PCP follow-up for further evaluation.  Also given strict return precautions.  Patient is understanding and amenable with plan, discharged in stable condition.   Strict return and follow-up precautions have been given by me personally or by detailed written instructions verbalized by nursing staff using the teach back method to patient/family/caregiver.  Data Reviewed/Counseling: I have reviewed the patient's vital signs, nursing notes, and other relevant tests/information. I had a detailed discussion regarding the historical points, exam findings, and any diagnostic results supporting the discharge diagnosis. I also discussed the need for outpatient follow-up and the need to return to the ED if symptoms worsen or if there are any questions or concerns that arise at home  Findings and plan of care discussed with supervising physician Dr. Matilde Sprang who is in agreement.    Final Clinical Impression(s) / ED Diagnoses Final diagnoses:  Acute nonintractable headache, unspecified headache type    Rx / DC Orders ED Discharge Orders     None     An After Visit Summary was printed and given to the patient.     Nestor Lewandowsky 03/10/22 2216    Teressa Lower, MD 03/13/22 [phone]

## 2022-04-13 ENCOUNTER — Ambulatory Visit
Admission: RE | Admit: 2022-04-13 | Discharge: 2022-04-13 | Disposition: A | Discharge status: Home / Self Care | Payer: 59 | Source: Ambulatory Visit

## 2022-04-13 ENCOUNTER — Ambulatory Visit (INDEPENDENT_AMBULATORY_CARE_PROVIDER_SITE_OTHER): Payer: 59

## 2022-04-13 VITALS — BP 146/103 | HR 72 | Temp 98.6°F | Resp 18

## 2022-04-13 DIAGNOSIS — R051 Acute cough: Secondary | ICD-10-CM | POA: Diagnosis not present

## 2022-04-13 DIAGNOSIS — J209 Acute bronchitis, unspecified: Secondary | ICD-10-CM | POA: Diagnosis not present

## 2022-04-13 DIAGNOSIS — R059 Cough, unspecified: Secondary | ICD-10-CM | POA: Diagnosis not present

## 2022-04-13 MED ORDER — PROMETHAZINE-DM 6.25-15 MG/5ML PO SYRP: 5.0000 mL | ORAL_SOLUTION | Freq: Four times a day (QID) | ORAL | 0 refills | Status: DC | PRN

## 2022-04-13 MED ORDER — DOXYCYCLINE HYCLATE 100 MG PO CAPS: 100.0000 mg | ORAL_CAPSULE | Freq: Two times a day (BID) | ORAL | 0 refills | Status: DC

## 2022-04-13 MED ORDER — PREDNISONE 20 MG PO TABS: 20.0000 mg | ORAL_TABLET | Freq: Two times a day (BID) | ORAL | 0 refills | Status: DC

## 2022-04-13 MED ORDER — ALBUTEROL SULFATE HFA 108 (90 BASE) MCG/ACT IN AERS: 1.0000 | INHALATION_SPRAY | Freq: Four times a day (QID) | RESPIRATORY_TRACT | 0 refills | Status: DC | PRN

## 2022-04-13 MED ORDER — PROMETHAZINE-DM 6.25-15 MG/5ML PO SYRP
5.0000 mL | ORAL_SOLUTION | Freq: Four times a day (QID) | ORAL | 0 refills | Status: DC | PRN
Start: 2022-04-13 — End: 2022-04-29

## 2022-04-13 MED ORDER — PREDNISONE 20 MG PO TABS: 20.0000 mg | ORAL_TABLET | Freq: Two times a day (BID) | ORAL | 0 refills | Status: AC

## 2022-04-13 NOTE — ED Triage Notes (Signed)
Pt reports cough, chest congestion,  and sinus pain x 1 week; wheezing x 4 days. Wheezing is worse in the morning. States he return from [place] and [place] 2 days ago. Mucinex gives no relief.

## 2022-04-13 NOTE — ED Provider Notes (Signed)
[place]   MRN: [id number] DOB: 1965/10/01  Subjective:   Chief Complaint;  Chief Complaint  Patient presents with   Appointment    1100 Cough, wheezing, and sinus pain. - Entered by patient   Scott [name] is a 55 y.o. male with history of hypertension presenting for Pt reports cough, chest congestion,  and sinus pain x 1 week; wheezing x 4 days. Wheezing is worse in the morning. States he return from [place] and [place] 2 days ago and many people on the plane were coughing. Mucinex gives no relief.  He denies fever, history of bronchitis or pneumonia.  No current facility-administered medications for this encounter.  Current Outpatient Medications:    Acetaminophen-guaiFENesin (MUCINEX COLD & FLU PO), Take by mouth., Disp: , Rfl:    albuterol (VENTOLIN HFA) 108 (90 Base) MCG/ACT inhaler, Inhale 1-2 puffs into the lungs every 6 (six) hours as needed for wheezing or shortness of breath., Disp: 18 g, Rfl: 0   doxycycline (VIBRAMYCIN) 100 MG capsule, Take 1 capsule (100 mg total) by mouth 2 (two) times daily., Disp: 20 capsule, Rfl: 0   predniSONE (DELTASONE) 20 MG tablet, Take 1 tablet (20 mg total) by mouth 2 (two) times daily with a meal for 5 days., Disp: 10 tablet, Rfl: 0   promethazine-dextromethorphan (PROMETHAZINE-DM) 6.25-15 MG/5ML syrup, Take 5 mLs by mouth 4 (four) times daily as needed for cough., Disp: 118 mL, Rfl: 0   atenolol-chlorthalidone (TENORETIC) 100-25 MG per tablet, Take 1 tablet by mouth daily., Disp: , Rfl:    atorvastatin (LIPITOR) 20 MG tablet, Take 20 mg by mouth every evening. , Disp: , Rfl:    busPIRone (BUSPAR) 10 MG tablet, Take 10 mg by mouth daily. , Disp: , Rfl: 0   Canagliflozin-Metformin HCl ER (INVOKAMET XR) 50-500 MG TB24, Take 2 tablets by mouth daily before breakfast. (Patient taking differently: Take 1 tablet by mouth in the morning and at bedtime.), Disp: 60 tablet, Rfl: 3   ferrous sulfate 325 (65 FE) MG tablet, Take 325  mg by mouth daily., Disp: , Rfl:    fluticasone (FLONASE) 50 MCG/ACT nasal spray, Place 1 spray into both nostrils daily for 14 days., Disp: 16 g, Rfl: 0   gabapentin (NEURONTIN) 300 MG capsule, Take 300 mg by mouth at bedtime. , Disp: , Rfl:    glucose blood test strip, Use to check blood sugar once a day., Disp: 100 each, Rfl: 12   HYDROcodone-acetaminophen (NORCO) 10-325 MG tablet, Take 1-2 tablets by mouth daily as needed (pain.). , Disp: , Rfl:    hydrOXYzine (ATARAX/VISTARIL) 25 MG tablet, Take 25 mg by mouth at bedtime., Disp: , Rfl: 0   lisinopril (PRINIVIL,ZESTRIL) 2.5 MG tablet, Take 2.5 mg by mouth every evening. , Disp: , Rfl:    LORazepam (ATIVAN) 0.5 MG tablet, Take 0.5 mg by mouth at bedtime., Disp: , Rfl:    meloxicam (MOBIC) 15 MG tablet, Take 15 mg by mouth daily., Disp: , Rfl:    MICROLET LANCETS MISC, Use to check blood sugar., Disp: 100 each, Rfl: 2   Multiple Vitamin (MULTIVITAMIN WITH MINERALS) TABS tablet, Take 1 tablet by mouth daily. Multivitamin for Adults 50+ (Iron Free), Disp: , Rfl:    Multiple Vitamins-Minerals (IMMUNE SYSTEM BOOSTER PO), Take 2 tablets by mouth daily. Chewables, Disp: , Rfl:    Omega-3 Fatty Acids (FISH OIL) 1000 MG CAPS, Take 1,000 mg by mouth at bedtime., Disp: , Rfl:    omeprazole (PRILOSEC) 40  MG capsule, Take 40 mg by mouth 2 (two) times daily., Disp: , Rfl:    ondansetron (ZOFRAN-ODT) 4 MG disintegrating tablet, Take 1 tablet (4 mg total) by mouth every 8 (eight) hours as needed for nausea or vomiting., Disp: 20 tablet, Rfl: 0   oseltamivir (TAMIFLU) 75 MG capsule, Take 1 capsule (75 mg total) by mouth every 12 (twelve) hours., Disp: 10 capsule, Rfl: 0   rifaximin (XIFAXAN) 200 MG tablet, Take 200 mg by mouth 3 (three) times daily., Disp: , Rfl:    RYBELSUS 7 MG TABS, Take 7 mg by mouth daily. , Disp: , Rfl:    timolol (TIMOPTIC) 0.5 % ophthalmic solution, Place 1 drop into the left eye 2 (two) times daily., Disp: , Rfl:    tiZANidine  (ZANAFLEX) 4 MG tablet, Take 4 mg by mouth at bedtime., Disp: , Rfl:    TURMERIC PO, Take 2 capsules by mouth daily., Disp: , Rfl:    zolpidem (AMBIEN) 10 MG tablet, Take 10 mg by mouth at bedtime., Disp: , Rfl:    Allergies  Allergen Reactions   Trazodone Hcl     Other reaction(s): rapid heartbeat   Trulicity [Dulaglutide] Other (See Comments)    Weakness - severe   Metformin And Related Diarrhea    Diarrhea not frequent    Past Medical History:  Diagnosis Date   Anemia    Anxiety    Arthritis    Back pain    pinched nerve sciatica   Diabetes mellitus without complication (HCC)    type  2   Elevated cholesterol with elevated triglycerides    Elevated liver enzymes 2014   normal now   GERD (gastroesophageal reflux disease)    Hypertension    Hypertension    Sleep apnea    uses cpap,setting of 14     Review of Systems  All other systems reviewed and are negative.    Objective:   Vitals: BP (!) 146/103 (BP Location: Right Arm)   Pulse 72   Temp 98.6 F (37 C) (Oral)   Resp 18   SpO2 98%   Physical Exam Constitutional:      Appearance: Normal appearance. He is not toxic-appearing.  HENT:     Head: Atraumatic.     Right Ear: Tympanic membrane and external ear normal.     Left Ear: Tympanic membrane and external ear normal.     Nose: Nose normal. No congestion.     Mouth/Throat:     Mouth: Mucous membranes are moist.     Pharynx: No posterior oropharyngeal erythema.  Eyes:     General: No scleral icterus.       Right eye: No discharge.        Left eye: No discharge.     Conjunctiva/sclera: Conjunctivae normal.  Cardiovascular:     Rate and Rhythm: Normal rate and regular rhythm.  Pulmonary:     Effort: Pulmonary effort is normal. No respiratory distress.     Breath sounds: No stridor. Wheezing present. No rhonchi or rales.     Comments: Upper airway rhonchi clears on cough.  Mild forced expiratory wheeze on cough.  Patient is speaking in full sentences  oxygen saturation is 98% on room air Chest:     Chest wall: No tenderness.  Skin:    General: Skin is warm and dry.  Neurological:     Mental Status: He is alert.  Psychiatric:        Mood and Affect: Mood normal.  DG Chest 2 View  Result Date: 04/13/2022 CLINICAL DATA:  Cough for 2 weeks EXAM: CHEST - 2 VIEW COMPARISON:  None Available. FINDINGS: The heart size and mediastinal contours are within normal limits. Both lungs are clear. The visualized skeletal structures are unremarkable. IMPRESSION: No active cardiopulmonary disease. Electronically Signed   By: Kathreen Devoid M.D.   On: 04/13/2022 11:50       Assessment and Plan :   1. Acute cough   2. Acute bronchitis, unspecified organism     Meds ordered this encounter  Medications   predniSONE (DELTASONE) 20 MG tablet    Sig: Take 1 tablet (20 mg total) by mouth 2 (two) times daily with a meal for 5 days.    Dispense:  10 tablet    Refill:  0    Order Specific Question:   Supervising Provider    Answer:   Chase Picket [id number]   doxycycline (VIBRAMYCIN) 100 MG capsule    Sig: Take 1 capsule (100 mg total) by mouth 2 (two) times daily.    Dispense:  20 capsule    Refill:  0    Order Specific Question:   Supervising Provider    Answer:   Chase Picket [id number]   promethazine-dextromethorphan (PROMETHAZINE-DM) 6.25-15 MG/5ML syrup    Sig: Take 5 mLs by mouth 4 (four) times daily as needed for cough.    Dispense:  118 mL    Refill:  0    Order Specific Question:   Supervising Provider    Answer:   Chase Picket [[id number]]   albuterol (VENTOLIN HFA) 108 (90 Base) MCG/ACT inhaler    Sig: Inhale 1-2 puffs into the lungs every 6 (six) hours as needed for wheezing or shortness of breath.    Dispense:  18 g    Refill:  0    Order Specific Question:   Supervising Provider    Answer:   Chase Picket [[id number]]    MDM:  Scott [name] is a 56 y.o. male presenting for acute upper airway congestion and  productive cough with yellow sputum for the past week not improving.  Patient's exam is unremarkable except for rhonchi and expiratory wheezing.  His blood pressure was slightly elevated however he has been taking phenylephrine and his Mucinex.  Symptoms have persisted beyond a week although I could not rule out viral only infection with his broad recent exposure to multiple ill people will treat with antibiotics and steroid.  Cough medicine for night.  I discussed todays findings, treatment plan, follow up and return instructions. Questions were answered. Patient/representative stated understanding of the instructions and patient is stable for discharge.  Leida Lauth FNP-C MSN    Hezzie Bump, NP 04/13/22 [phone]

## 2022-04-13 NOTE — Discharge Instructions (Signed)
Take steroid and antibiotics as directed stop phenylephrine as it will raise your blood pressure.  Drink plenty of fluids and use cough suppression syrup at night as needed.  Use the inhaler if needed for acute shortness of breath.  Return if not improving or worse at any time for reevaluation.  Your chest x-ray today was normal.

## 2022-04-16 DIAGNOSIS — S233XXA Sprain of ligaments of thoracic spine, initial encounter: Secondary | ICD-10-CM | POA: Diagnosis not present

## 2022-04-16 DIAGNOSIS — S338XXA Sprain of other parts of lumbar spine and pelvis, initial encounter: Secondary | ICD-10-CM | POA: Diagnosis not present

## 2022-04-16 DIAGNOSIS — S134XXA Sprain of ligaments of cervical spine, initial encounter: Secondary | ICD-10-CM | POA: Diagnosis not present

## 2022-04-29 ENCOUNTER — Ambulatory Visit
Admission: RE | Admit: 2022-04-29 | Discharge: 2022-04-29 | Disposition: A | Discharge status: Home / Self Care | Payer: 59 | Source: Ambulatory Visit | Attending: Physician Assistant | Admitting: Physician Assistant

## 2022-04-29 VITALS — BP 112/75 | HR 80 | Temp 99.2°F | Resp 18 | Ht 73.0 in | Wt 278.0 lb

## 2022-04-29 DIAGNOSIS — J209 Acute bronchitis, unspecified: Secondary | ICD-10-CM | POA: Diagnosis not present

## 2022-04-29 MED ORDER — PROMETHAZINE-DM 6.25-15 MG/5ML PO SYRP
5.0000 mL | ORAL_SOLUTION | Freq: Four times a day (QID) | ORAL | 0 refills | Status: DC | PRN
Start: 2022-04-29 — End: 2022-11-20

## 2022-04-29 MED ORDER — AZITHROMYCIN 250 MG PO TABS: 250.0000 mg | ORAL_TABLET | Freq: Every day | ORAL | 0 refills | Status: DC

## 2022-04-29 NOTE — ED Triage Notes (Signed)
Pt states that he was seen recently. Pt states that he still has a cough. X2 weeks

## 2022-04-29 NOTE — ED Provider Notes (Signed)
[facility]    CSN: [id number] Arrival date & time: 04/29/22  1057      History   Chief Complaint Chief Complaint  Patient presents with   Cough    Cough x2 weeks    HPI Scott [name] is a 56 y.o. male.   Patient here today for evaluation of continued cough after recently being diagnosed with bronchitis.  He reports that he has not had fever and overall does not feel poorly other than some fatigue.  He does note that he has had trouble sleeping at night due to cough.  Cough is occasionally productive.  He does not report any nausea, vomiting or diarrhea.  He states he did have improvement of symptoms while taking steroids.  The history is provided by the patient.  Cough Associated symptoms: no chills, no ear pain, no eye discharge, no fever, no shortness of breath, no sore throat and no wheezing     Past Medical History:  Diagnosis Date   Anemia    Anxiety    Arthritis    Back pain    pinched nerve sciatica   Diabetes mellitus without complication (HCC)    type  2   Elevated cholesterol with elevated triglycerides    Elevated liver enzymes 2014   normal now   GERD (gastroesophageal reflux disease)    Hypertension    Hypertension    Sleep apnea    uses cpap,setting of 14    Patient Active Problem List   Diagnosis Date Noted   Palpitations 02/21/2016    Past Surgical History:  Procedure Laterality Date   CATARACT EXTRACTION Left    COLONOSCOPY N/A 09/29/2012   Procedure: COLONOSCOPY;  Surgeon: Juanita Craver, MD;  Location: [facility];  Service: Endoscopy;  Laterality: N/A;   COLONOSCOPY WITH PROPOFOL N/A 06/22/2016   Procedure: COLONOSCOPY WITH PROPOFOL;  Surgeon: Carol Ada, MD;  Location: [facility];  Service: Endoscopy;  Laterality: N/A;   COLONOSCOPY WITH PROPOFOL N/A 05/20/2020   Procedure: COLONOSCOPY WITH PROPOFOL;  Surgeon: Carol Ada, MD;  Location: [facility];  Service: Endoscopy;  Laterality: N/A;   ENTEROSCOPY N/A  05/20/2020   Procedure: ENTEROSCOPY;  Surgeon: Carol Ada, MD;  Location: [facility];  Service: Endoscopy;  Laterality: N/A;   ESOPHAGOGASTRODUODENOSCOPY N/A 09/29/2012   Procedure: ESOPHAGOGASTRODUODENOSCOPY (EGD);  Surgeon: Juanita Craver, MD;  Location: [facility];  Service: Endoscopy;  Laterality: N/A;   ESOPHAGOGASTRODUODENOSCOPY (EGD) WITH PROPOFOL N/A 06/22/2016   Procedure: ESOPHAGOGASTRODUODENOSCOPY (EGD) WITH PROPOFOL;  Surgeon: Carol Ada, MD;  Location: [facility];  Service: Endoscopy;  Laterality: N/A;   FRACTURE SURGERY     right wrist with screws   HERNIA REPAIR  2008   inguinal   KNEE ARTHROSCOPY Left 06/24/2020   Procedure: ARTHROSCOPY KNEE WITH PARTIAL MEDIAL MENISCECTOMY, CHONDROPLASTY,  OPEN CYSTECTOMY;  Surgeon: Dorna Leitz, MD;  Location: [facility];  Service: Orthopedics;  Laterality: Left;       Home Medications    Prior to Admission medications   Medication Sig Start Date End Date Taking? Authorizing Provider  Acetaminophen-guaiFENesin [facility] COLD & FLU PO) Take by mouth.   Yes [provider]  albuterol (VENTOLIN HFA) 108 (90 Base) MCG/ACT inhaler Inhale 1-2 puffs into the lungs every 6 (six) hours as needed for wheezing or shortness of breath. 04/13/22  Yes Hezzie Bump, NP  atenolol-chlorthalidone (TENORETIC) 100-25 MG per tablet Take 1 tablet by mouth daily.   Yes [provider]  atorvastatin (LIPITOR) 20 MG tablet Take 20 mg  by mouth every evening.    Yes [provider]  azithromycin (ZITHROMAX) 250 MG tablet Take 1 tablet (250 mg total) by mouth daily. Take first 2 tablets together, then 1 every day until finished. 04/29/22  Yes Francene Finders, PA-C  busPIRone (BUSPAR) 10 MG tablet Take 10 mg by mouth daily.  07/07/15  Yes [provider]  Canagliflozin-Metformin HCl ER (INVOKAMET XR) 50-500 MG TB24 Take 2 tablets by mouth daily before breakfast. Patient taking differently: Take 1 tablet by mouth in the morning and  at bedtime. 07/02/16  Yes Philemon Kingdom, MD  doxycycline (VIBRAMYCIN) 100 MG capsule Take 1 capsule (100 mg total) by mouth 2 (two) times daily. 04/13/22  Yes Hezzie Bump, NP  ferrous sulfate 325 (65 FE) MG tablet Take 325 mg by mouth daily.   Yes [provider]  gabapentin (NEURONTIN) 300 MG capsule Take 300 mg by mouth at bedtime.    Yes [provider]  glucose blood test strip Use to check blood sugar once a day. 07/02/16  Yes Philemon Kingdom, MD  HYDROcodone-acetaminophen (NORCO) 10-325 MG tablet Take 1-2 tablets by mouth daily as needed (pain.).  06/06/20  Yes [provider]  hydrOXYzine (ATARAX/VISTARIL) 25 MG tablet Take 25 mg by mouth at bedtime. 07/04/15  Yes [provider]  lisinopril (PRINIVIL,ZESTRIL) 2.5 MG tablet Take 2.5 mg by mouth every evening.    Yes [provider]  LORazepam (ATIVAN) 0.5 MG tablet Take 0.5 mg by mouth at bedtime.   Yes [provider]  meloxicam (MOBIC) 15 MG tablet Take 15 mg by mouth daily. 06/03/20  Yes [provider]  MICROLET LANCETS MISC Use to check blood sugar. 07/02/16  Yes Philemon Kingdom, MD  Multiple Vitamin (MULTIVITAMIN WITH MINERALS) TABS tablet Take 1 tablet by mouth daily. Multivitamin for Adults 50+ (Iron Free)   Yes [provider]  Multiple Vitamins-Minerals (IMMUNE SYSTEM BOOSTER PO) Take 2 tablets by mouth daily. Chewables   Yes [provider]  Omega-3 Fatty Acids (FISH OIL) 1000 MG CAPS Take 1,000 mg by mouth at bedtime.   Yes [provider]  omeprazole (PRILOSEC) 40 MG capsule Take 40 mg by mouth 2 (two) times daily. 05/09/20  Yes [provider]  ondansetron (ZOFRAN-ODT) 4 MG disintegrating tablet Take 1 tablet (4 mg total) by mouth every 8 (eight) hours as needed for nausea or vomiting. 08/01/21  Yes Volney American, PA-C  oseltamivir (TAMIFLU) 75 MG capsule Take 1 capsule (75 mg total) by mouth every 12 (twelve) hours.  08/01/21  Yes Volney American, PA-C  promethazine-dextromethorphan (PROMETHAZINE-DM) 6.25-15 MG/5ML syrup Take 5 mLs by mouth 4 (four) times daily as needed for cough. 04/29/22  Yes Francene Finders, PA-C  rifaximin (XIFAXAN) 200 MG tablet Take 200 mg by mouth 3 (three) times daily.   Yes [provider]  RYBELSUS 7 MG TABS Take 7 mg by mouth daily.  04/29/20  Yes [provider]  timolol (TIMOPTIC) 0.5 % ophthalmic solution Place 1 drop into the left eye 2 (two) times daily. 05/06/20  Yes [provider]  tiZANidine (ZANAFLEX) 4 MG tablet Take 4 mg by mouth at bedtime. 04/29/20  Yes [provider]  TURMERIC PO Take 2 capsules by mouth daily.   Yes [provider]  zolpidem (AMBIEN) 10 MG tablet Take 10 mg by mouth at bedtime. 05/06/20  Yes [provider]  fluticasone (FLONASE) 50 MCG/ACT nasal spray Place 1 spray into both nostrils  daily for 14 days. 04/06/20 05/12/21  Avegno, Darrelyn Hillock, FNP  liraglutide (VICTOZA) 18 MG/3ML SOPN Inject 1.2 mg into the skin every evening.  11/02/19  [provider]    Family History Family History  Problem Relation Age of Onset   Diabetes Mother    Hypertension Mother    Hyperlipidemia Mother    Cancer Father    Thyroid disease Sister     Social History Social History   Tobacco Use   Smoking status: Never   Smokeless tobacco: Never  Vaping Use   Vaping Use: Never used  Substance Use Topics   Alcohol use: Yes    Comment: rarely   Drug use: Yes    Types: Marijuana    Comment: rare marijuana use only last used 3 months ago     Allergies   Trazodone hcl, Trulicity [dulaglutide], and Metformin and related   Review of Systems Review of Systems  Constitutional:  Negative for chills and fever.  HENT:  Positive for congestion. Negative for ear pain and sore throat.   Eyes:  Negative for discharge and redness.  Respiratory:  Positive for cough. Negative for shortness of breath and  wheezing.   Gastrointestinal:  Negative for abdominal pain, nausea and vomiting.     Physical Exam Triage Vital Signs ED Triage Vitals  Enc Vitals Group     BP      Pulse      Resp      Temp      Temp src      SpO2      Weight      Height      Head Circumference      Peak Flow      Pain Score      Pain Loc      Pain Edu?      Excl. in [place]?    No data found.  Updated Vital Signs BP 112/75 (BP Location: Right Arm)   Pulse 80   Temp 99.2 F (37.3 C) (Oral)   Resp 18   Ht '6\' 1"'$  (1.854 m)   Wt 278 lb (126.1 kg)   SpO2 94%   BMI 36.68 kg/m     Physical Exam Vitals and nursing note reviewed.  Constitutional:      General: He is not in acute distress.    Appearance: Normal appearance. He is not ill-appearing.  HENT:     Head: Normocephalic and atraumatic.     Nose: Nose normal. No congestion.     Mouth/Throat:     Mouth: Mucous membranes are moist.     Pharynx: Oropharynx is clear. No oropharyngeal exudate or posterior oropharyngeal erythema.  Eyes:     Conjunctiva/sclera: Conjunctivae normal.  Cardiovascular:     Rate and Rhythm: Normal rate and regular rhythm.     Heart sounds: Normal heart sounds. No murmur heard. Pulmonary:     Effort: Pulmonary effort is normal. No respiratory distress.     Breath sounds: Normal breath sounds. No wheezing, rhonchi or rales.  Skin:    General: Skin is warm and dry.  Neurological:     Mental Status: He is alert.  Psychiatric:        Mood and Affect: Mood normal.        Thought Content: Thought content normal.      UC Treatments / Results  Labs (all labs ordered are listed, but only abnormal results are displayed) Labs Reviewed - No data to display  EKG   Radiology No results found.  Procedures Procedures (including critical care time)  Medications Ordered in UC Medications - No data to display  Initial Impression / Assessment and Plan / UC Course  I have reviewed the triage vital signs and the nursing  notes.  Pertinent labs & imaging results that were available during my care of the patient were reviewed by me and considered in my medical decision making (see chart for details).    Suspect residual bronchitis.  Cough syrup refilled to hopefully help with cough at night and Z-Pak also prescribed for anti-inflammatory properties as well as coverage for atypical pneumonia given duration of symptoms.  Recommended follow-up if no gradual improvement as he may need repeat x-ray at that time.  Discussed option for x-ray today outpatient at [facility] however patient declines.  Encouraged follow-up with any further concerns.  Final Clinical Impressions(s) / UC Diagnoses   Final diagnoses:  Acute bronchitis, unspecified organism   Discharge Instructions   None    ED Prescriptions     Medication Sig Dispense Auth. Provider   azithromycin (ZITHROMAX) 250 MG tablet Take 1 tablet (250 mg total) by mouth daily. Take first 2 tablets together, then 1 every day until finished. 6 tablet Francene Finders, PA-C   promethazine-dextromethorphan (PROMETHAZINE-DM) 6.25-15 MG/5ML syrup Take 5 mLs by mouth 4 (four) times daily as needed for cough. 118 mL Francene Finders, PA-C      PDMP not reviewed this encounter.   Francene Finders, PA-C 04/29/22 1141

## 2022-05-07 ENCOUNTER — Ambulatory Visit: Payer: 59

## 2022-05-15 DIAGNOSIS — G4733 Obstructive sleep apnea (adult) (pediatric): Secondary | ICD-10-CM | POA: Diagnosis not present

## 2022-05-24 DIAGNOSIS — H40021 Open angle with borderline findings, high risk, right eye: Secondary | ICD-10-CM | POA: Diagnosis not present

## 2022-05-24 DIAGNOSIS — E119 Type 2 diabetes mellitus without complications: Secondary | ICD-10-CM | POA: Diagnosis not present

## 2022-05-24 DIAGNOSIS — H401122 Primary open-angle glaucoma, left eye, moderate stage: Secondary | ICD-10-CM | POA: Diagnosis not present

## 2022-05-28 DIAGNOSIS — H02886 Meibomian gland dysfunction of left eye, unspecified eyelid: Secondary | ICD-10-CM | POA: Diagnosis not present

## 2022-05-28 DIAGNOSIS — H02883 Meibomian gland dysfunction of right eye, unspecified eyelid: Secondary | ICD-10-CM | POA: Diagnosis not present

## 2022-05-28 DIAGNOSIS — Z961 Presence of intraocular lens: Secondary | ICD-10-CM | POA: Diagnosis not present

## 2022-05-28 DIAGNOSIS — Z9889 Other specified postprocedural states: Secondary | ICD-10-CM | POA: Diagnosis not present

## 2022-05-28 DIAGNOSIS — H179 Unspecified corneal scar and opacity: Secondary | ICD-10-CM | POA: Diagnosis not present

## 2022-10-01 ENCOUNTER — Ambulatory Visit
Admission: EM | Admit: 2022-10-01 | Discharge: 2022-10-01 | Disposition: A | Discharge status: Home / Self Care | Payer: 59

## 2022-10-01 ENCOUNTER — Ambulatory Visit: Payer: 59

## 2022-10-01 DIAGNOSIS — K649 Unspecified hemorrhoids: Secondary | ICD-10-CM | POA: Diagnosis not present

## 2022-10-01 DIAGNOSIS — K6289 Other specified diseases of anus and rectum: Secondary | ICD-10-CM

## 2022-10-01 DIAGNOSIS — K623 Rectal prolapse: Secondary | ICD-10-CM

## 2022-10-01 MED ORDER — DOCUSATE SODIUM 100 MG PO CAPS: 100.0000 mg | ORAL_CAPSULE | Freq: Two times a day (BID) | ORAL | 0 refills | Status: DC

## 2022-10-01 MED ORDER — HYDROCORTISONE ACETATE 25 MG RE SUPP: 25.0000 mg | Freq: Two times a day (BID) | RECTAL | 1 refills | Status: DC

## 2022-10-01 MED ORDER — BENZOCAINE 20 % RE OINT
TOPICAL_OINTMENT | RECTAL | 0 refills | Status: DC | PRN
Start: 2022-10-01 — End: 2023-08-12

## 2022-10-01 NOTE — Discharge Instructions (Addendum)
Take medication as prescribed. Increase fluids.  Try to drink at least 8-10 8 ounce glasses of water daily.  This will help prevent constipation. Recommend increasing the fruit and fiber intake in your diet.  Try to have at least 1-2 serving of fruits and vegetables with every meal to prevent constipation. Avoid straining with bowel movements as this will worsen the rectal prolapse. Void heavy lifting or straining. As discussed, it is recommended that you follow-up with gastroenterology as soon as possible for further evaluation. Please go to the emergency department if you develop worsening rectal pain, fever, rectal bleeding, with nausea, vomiting, or other concerns. Follow-up as needed.

## 2022-10-01 NOTE — ED Provider Notes (Signed)
[facility]    CSN: [id number] Arrival date & time: 10/01/22  0955      History   Chief Complaint Chief Complaint  Patient presents with   Appointment    1000   Hemorrhoids    HPI Scott [name] is a 57 y.o. male.   The history is provided by the patient.   The patient presents for complaints of hemorrhoids and possible rectal prolapse that been present for the past 3 days.  Patient has a history of internal and external hemorrhoids.  He states over the last several days, he has had worsening rectal pain and itching.  Patient states that he has IBS which causes him to be both constipated and to have diarrhea.  He denies rectal bleeding, abdominal pain, nausea, vomiting, or diarrhea. Of note the patient is lactose intolerant.  He has used hydrocortisone injectable foam.  He states he has been using over-the-counter hemorrhoid cream which gives him no relief at this time.  Patient has seen GI in the past and states he has had at least 2 or 3 colonoscopies.  He states he was told by GI, that he would need surgery.  Past Medical History:  Diagnosis Date   Anemia    Anxiety    Arthritis    Back pain    pinched nerve sciatica   Diabetes mellitus without complication (HCC)    type  2   Elevated cholesterol with elevated triglycerides    Elevated liver enzymes 2014   normal now   GERD (gastroesophageal reflux disease)    Hypertension    Hypertension    Sleep apnea    uses cpap,setting of 14    Patient Active Problem List   Diagnosis Date Noted   Palpitations 02/21/2016    Past Surgical History:  Procedure Laterality Date   CATARACT EXTRACTION Left    COLONOSCOPY N/A 09/29/2012   Procedure: COLONOSCOPY;  Surgeon: Juanita Craver, MD;  Location: [facility];  Service: Endoscopy;  Laterality: N/A;   COLONOSCOPY WITH PROPOFOL N/A 06/22/2016   Procedure: COLONOSCOPY WITH PROPOFOL;  Surgeon: Carol Ada, MD;  Location: [facility];  Service: Endoscopy;   Laterality: N/A;   COLONOSCOPY WITH PROPOFOL N/A 05/20/2020   Procedure: COLONOSCOPY WITH PROPOFOL;  Surgeon: Carol Ada, MD;  Location: [facility];  Service: Endoscopy;  Laterality: N/A;   ENTEROSCOPY N/A 05/20/2020   Procedure: ENTEROSCOPY;  Surgeon: Carol Ada, MD;  Location: [facility];  Service: Endoscopy;  Laterality: N/A;   ESOPHAGOGASTRODUODENOSCOPY N/A 09/29/2012   Procedure: ESOPHAGOGASTRODUODENOSCOPY (EGD);  Surgeon: Juanita Craver, MD;  Location: [facility];  Service: Endoscopy;  Laterality: N/A;   ESOPHAGOGASTRODUODENOSCOPY (EGD) WITH PROPOFOL N/A 06/22/2016   Procedure: ESOPHAGOGASTRODUODENOSCOPY (EGD) WITH PROPOFOL;  Surgeon: Carol Ada, MD;  Location: [facility];  Service: Endoscopy;  Laterality: N/A;   FRACTURE SURGERY     right wrist with screws   HERNIA REPAIR  2008   inguinal   KNEE ARTHROSCOPY Left 06/24/2020   Procedure: ARTHROSCOPY KNEE WITH PARTIAL MEDIAL MENISCECTOMY, CHONDROPLASTY,  OPEN CYSTECTOMY;  Surgeon: Dorna Leitz, MD;  Location: [facility];  Service: Orthopedics;  Laterality: Left;       Home Medications    Prior to Admission medications   Medication Sig Start Date End Date Taking? Authorizing Provider  benzocaine (AMERICAINE) 20 % rectal ointment Place rectally every 3 (three) hours as needed for pain. 10/01/22  Yes Chevy Sweigert-Warren, Alda Lea, NP  Dapagliflozin Prop-metFORMIN (XIGDUO XR PO) Take by mouth.   Yes [provider]  docusate sodium (COLACE) 100 MG capsule Take 1 capsule (100 mg total) by mouth every 12 (twelve) hours. 10/01/22  Yes Murriel Eidem-Warren, Alda Lea, NP  Eszopiclone 3 MG TABS Take 3 mg by mouth at bedtime. 09/26/22  Yes [provider]  hydrocortisone (ANUSOL-HC) 25 MG suppository Place 1 suppository (25 mg total) rectally 2 (two) times daily. 10/01/22  Yes Sayge Brienza-Warren, Alda Lea, NP  MOUNJARO 10 MG/0.5ML Pen 0.5 ml Subcutaneous 0nce a week for 30 days 09/24/22  Yes [provider]  tizanidine  (ZANAFLEX) 2 MG capsule Take 2 mg by mouth 3 (three) times daily.   Yes [provider]  Acetaminophen-guaiFENesin [facility] COLD & FLU PO) Take by mouth.    [provider]  albuterol (VENTOLIN HFA) 108 (90 Base) MCG/ACT inhaler Inhale 1-2 puffs into the lungs every 6 (six) hours as needed for wheezing or shortness of breath. 04/13/22   Hezzie Bump, NP  atenolol-chlorthalidone (TENORETIC) 100-25 MG per tablet Take 1 tablet by mouth daily.    [provider]  atorvastatin (LIPITOR) 20 MG tablet Take 20 mg by mouth every evening.     [provider]  azithromycin (ZITHROMAX) 250 MG tablet Take 1 tablet (250 mg total) by mouth daily. Take first 2 tablets together, then 1 every day until finished. 04/29/22   Francene Finders, PA-C  busPIRone (BUSPAR) 10 MG tablet Take 10 mg by mouth daily.  07/07/15   [provider]  Canagliflozin-Metformin HCl ER (INVOKAMET XR) 50-500 MG TB24 Take 2 tablets by mouth daily before breakfast. Patient taking differently: Take 1 tablet by mouth in the morning and at bedtime. 07/02/16   Philemon Kingdom, MD  doxycycline (VIBRAMYCIN) 100 MG capsule Take 1 capsule (100 mg total) by mouth 2 (two) times daily. 04/13/22   Hezzie Bump, NP  ferrous sulfate 325 (65 FE) MG tablet Take 325 mg by mouth daily.    [provider]  fluticasone (FLONASE) 50 MCG/ACT nasal spray Place 1 spray into both nostrils daily for 14 days. 04/06/20 05/12/21  Avegno, Darrelyn Hillock, FNP  gabapentin (NEURONTIN) 300 MG capsule Take 300 mg by mouth at bedtime.     [provider]  glucose blood test strip Use to check blood sugar once a day. 07/02/16   Philemon Kingdom, MD  HYDROcodone-acetaminophen (NORCO) 10-325 MG tablet Take 1-2 tablets by mouth daily as needed (pain.).  06/06/20   [provider]  hydrOXYzine (ATARAX/VISTARIL) 25 MG tablet Take 25 mg by mouth at bedtime. 07/04/15   [provider]  lisinopril  (PRINIVIL,ZESTRIL) 2.5 MG tablet Take 2.5 mg by mouth every evening.     [provider]  LORazepam (ATIVAN) 0.5 MG tablet Take 0.5 mg by mouth at bedtime.    [provider]  meloxicam (MOBIC) 15 MG tablet Take 15 mg by mouth daily. 06/03/20   [provider]  MICROLET LANCETS MISC Use to check blood sugar. 07/02/16   Philemon Kingdom, MD  Multiple Vitamin (MULTIVITAMIN WITH MINERALS) TABS tablet Take 1 tablet by mouth daily. Multivitamin for Adults 50+ (Iron Free)    [provider]  Multiple Vitamins-Minerals (IMMUNE SYSTEM BOOSTER PO) Take 2 tablets by mouth daily. Chewables    [provider]  Omega-3 Fatty Acids (FISH OIL) 1000 MG CAPS Take 1,000 mg by mouth at bedtime.    [provider]  omeprazole (PRILOSEC) 40 MG capsule Take 40 mg by mouth 2 (two) times daily. 05/09/20   [provider]  ondansetron (  ZOFRAN-ODT) 4 MG disintegrating tablet Take 1 tablet (4 mg total) by mouth every 8 (eight) hours as needed for nausea or vomiting. 08/01/21   Volney American, PA-C  oseltamivir (TAMIFLU) 75 MG capsule Take 1 capsule (75 mg total) by mouth every 12 (twelve) hours. 08/01/21   Volney American, PA-C  promethazine-dextromethorphan (PROMETHAZINE-DM) 6.25-15 MG/5ML syrup Take 5 mLs by mouth 4 (four) times daily as needed for cough. 04/29/22   Francene Finders, PA-C  rifaximin (XIFAXAN) 200 MG tablet Take 200 mg by mouth 3 (three) times daily.    [provider]  RYBELSUS 7 MG TABS Take 7 mg by mouth daily.  04/29/20   [provider]  timolol (TIMOPTIC) 0.5 % ophthalmic solution Place 1 drop into the left eye 2 (two) times daily. 05/06/20   [provider]  tiZANidine (ZANAFLEX) 4 MG tablet Take 4 mg by mouth at bedtime. 04/29/20   [provider]  TURMERIC PO Take 2 capsules by mouth daily.    [provider]  zolpidem (AMBIEN) 10 MG tablet Take 10 mg by mouth at bedtime. 05/06/20    [provider]  liraglutide (VICTOZA) 18 MG/3ML SOPN Inject 1.2 mg into the skin every evening.  11/02/19  [provider]    Family History Family History  Problem Relation Age of Onset   Diabetes Mother    Hypertension Mother    Hyperlipidemia Mother    Cancer Father    Thyroid disease Sister     Social History Social History   Tobacco Use   Smoking status: Never   Smokeless tobacco: Never  Vaping Use   Vaping Use: Never used  Substance Use Topics   Alcohol use: Yes    Comment: rarely   Drug use: Yes    Types: Marijuana    Comment: rare marijuana use only last used 3 months ago     Allergies   Lactose intolerance (gi), Trazodone hcl, Trulicity [dulaglutide], and Metformin and related   Review of Systems Review of Systems Per HPI  Physical Exam Triage Vital Signs ED Triage Vitals  Enc Vitals Group     BP 10/01/22 1021 114/74     Pulse Rate 10/01/22 1021 74     Resp 10/01/22 1021 18     Temp 10/01/22 1021 98.7 F (37.1 C)     Temp Source 10/01/22 1021 Oral     SpO2 10/01/22 1021 98 %     Weight --      Height --      Head Circumference --      Peak Flow --      Pain Score 10/01/22 1020 10     Pain Loc --      Pain Edu? --      Excl. in [place]? --    No data found.  Updated Vital Signs BP 114/74 (BP Location: Right Arm)   Pulse 74   Temp 98.7 F (37.1 C) (Oral)   Resp 18   SpO2 98%   Visual Acuity Right Eye Distance:   Left Eye Distance:   Bilateral Distance:    Right Eye Near:   Left Eye Near:    Bilateral Near:     Physical Exam Vitals and nursing note reviewed.  Constitutional:      Appearance: Normal appearance.     Comments: Appears uncomfortable due to rectal pain  HENT:     Head: Normocephalic.  Eyes:     Extraocular Movements: Extraocular movements  intact.     Pupils: Pupils are equal, round, and reactive to light.  Cardiovascular:     Rate and Rhythm: Normal rate and regular rhythm.     Pulses: Normal  pulses.     Heart sounds: Normal heart sounds.  Pulmonary:     Effort: Pulmonary effort is normal.     Breath sounds: Normal breath sounds.  Abdominal:     General: Bowel sounds are normal.     Palpations: Abdomen is soft.     Tenderness: There is no abdominal tenderness.  Genitourinary:    Rectum: External hemorrhoid present.     Comments: Meg, RT in to chaperone.  Rectal prolapse present. Left perirectal external hemorrhoid nonthrombosed, tender, nonbleeding, no anal fissures present. Musculoskeletal:     Cervical back: Normal range of motion.  Lymphadenopathy:     Cervical: No cervical adenopathy.  Skin:    General: Skin is warm and dry.  Neurological:     General: No focal deficit present.     Mental Status: He is alert and oriented to person, place, and time.  Psychiatric:        Mood and Affect: Mood normal.        Behavior: Behavior normal.      UC Treatments / Results  Labs (all labs ordered are listed, but only abnormal results are displayed) Labs Reviewed - No data to display  EKG   Radiology No results found.  Procedures Procedures (including critical care time)  Medications Ordered in UC Medications - No data to display  Initial Impression / Assessment and Plan / UC Course  I have reviewed the triage vital signs and the nursing notes.  Pertinent labs & imaging results that were available during my care of the patient were reviewed by me and considered in my medical decision making (see chart for details).  Visual diagnosis of external nonthrombosed nonbleeding hemorrhoid and rectal prolapse noticed on exam.  Will treat patient with Colace 100 mg to help with bowel movements, Anusol 25 mg suppositories for hemorrhoid pain, and Americaine 20% rectal gel to help with rectal pain or discomfort.  Advised patient that he should follow-up with gastroenterology for further evaluation.  He has seen GI in the past, but no recent follow-up.  Supportive care  recommendations were discussed with the patient, along with strict ER follow-up precautions.  Patient verbalizes understanding and is in agreement with this plan of care.  All questions were answered.  Patient stable for discharge.  Final Clinical Impressions(s) / UC Diagnoses   Final diagnoses:  Hemorrhoids, unspecified hemorrhoid type  Anal or rectal pain  Rectal prolapse     Discharge Instructions      Take medication as prescribed. Increase fluids.  Try to drink at least 8-10 8 ounce glasses of water daily.  This will help prevent constipation. Recommend increasing the fruit and fiber intake in your diet.  Try to have at least 1-2 serving of fruits and vegetables with every meal to prevent constipation. Avoid straining with bowel movements as this will worsen the rectal prolapse. Void heavy lifting or straining. As discussed, it is recommended that you follow-up with gastroenterology as soon as possible for further evaluation. Please go to the emergency department if you develop worsening rectal pain, fever, rectal bleeding, with nausea, vomiting, or other concerns. Follow-up as needed.     ED Prescriptions     Medication Sig Dispense Auth. Provider   hydrocortisone (ANUSOL-HC) 25 MG suppository Place 1 suppository (25 mg  total) rectally 2 (two) times daily. 24 suppository Emonie Espericueta-Warren, Alda Lea, NP   docusate sodium (COLACE) 100 MG capsule Take 1 capsule (100 mg total) by mouth every 12 (twelve) hours. 60 capsule Kaymon Denomme-Warren, Alda Lea, NP   benzocaine (AMERICAINE) 20 % rectal ointment Place rectally every 3 (three) hours as needed for pain. 56 g Monna Crean-Warren, Alda Lea, NP      PDMP not reviewed this encounter.   Tish Men, NP 10/01/22 1100

## 2022-10-01 NOTE — ED Triage Notes (Signed)
Pt reports he has rectal prolapse x 3 days. Ibuprofen gives some relief; OTC hemorrhoids cream gives no relief.

## 2022-10-02 ENCOUNTER — Ambulatory Visit: Payer: 59 | Admitting: Surgery

## 2022-11-02 ENCOUNTER — Ambulatory Visit
Admission: RE | Admit: 2022-11-02 | Discharge: 2022-11-02 | Disposition: A | Discharge status: Home / Self Care | Payer: 59 | Source: Ambulatory Visit | Attending: Family Medicine | Admitting: Family Medicine

## 2022-11-02 ENCOUNTER — Other Ambulatory Visit: Payer: Self-pay

## 2022-11-02 VITALS — BP 113/74 | HR 83 | Temp 99.2°F | Resp 20

## 2022-11-02 DIAGNOSIS — J0100 Acute maxillary sinusitis, unspecified: Secondary | ICD-10-CM | POA: Diagnosis not present

## 2022-11-02 MED ORDER — PROMETHAZINE-DM 6.25-15 MG/5ML PO SYRP: 5.0000 mL | ORAL_SOLUTION | Freq: Four times a day (QID) | ORAL | 0 refills | Status: DC | PRN

## 2022-11-02 MED ORDER — AMOXICILLIN-POT CLAVULANATE 875-125 MG PO TABS
1.0000 | ORAL_TABLET | Freq: Two times a day (BID) | ORAL | 0 refills | Status: DC
Start: 2022-11-02 — End: 2022-11-20

## 2022-11-02 NOTE — ED Provider Notes (Signed)
[facility]    CSN: [id number] Arrival date & time: 11/02/22  1425      History   Chief Complaint Chief Complaint  Patient presents with   Facial Pain    Congestion, cold symptoms, possible sinus infection - Entered by patient    HPI Scott [name] is a 57 y.o. male.   Presenting today with about a week and a half of progressively worsening congestion, sore throat, body aches, low-grade fevers and worsening sinus pain and pressure.  Denies chest pain, shortness of breath, abdominal pain, nausea vomiting or diarrhea.  So far trying over-the-counter cold and congestion medications, Flonase with minimal relief.  No known sick contacts recently.    Past Medical History:  Diagnosis Date   Anemia    Anxiety    Arthritis    Back pain    pinched nerve sciatica   Diabetes mellitus without complication (HCC)    type  2   Elevated cholesterol with elevated triglycerides    Elevated liver enzymes 2014   normal now   GERD (gastroesophageal reflux disease)    Hypertension    Hypertension    Sleep apnea    uses cpap,setting of 14    Patient Active Problem List   Diagnosis Date Noted   Palpitations 02/21/2016    Past Surgical History:  Procedure Laterality Date   CATARACT EXTRACTION Left    COLONOSCOPY N/A 09/29/2012   Procedure: COLONOSCOPY;  Surgeon: Juanita Craver, MD;  Location: [facility];  Service: Endoscopy;  Laterality: N/A;   COLONOSCOPY WITH PROPOFOL N/A 06/22/2016   Procedure: COLONOSCOPY WITH PROPOFOL;  Surgeon: Carol Ada, MD;  Location: [facility];  Service: Endoscopy;  Laterality: N/A;   COLONOSCOPY WITH PROPOFOL N/A 05/20/2020   Procedure: COLONOSCOPY WITH PROPOFOL;  Surgeon: Carol Ada, MD;  Location: [facility];  Service: Endoscopy;  Laterality: N/A;   ENTEROSCOPY N/A 05/20/2020   Procedure: ENTEROSCOPY;  Surgeon: Carol Ada, MD;  Location: [facility];  Service: Endoscopy;  Laterality: N/A;   ESOPHAGOGASTRODUODENOSCOPY N/A  09/29/2012   Procedure: ESOPHAGOGASTRODUODENOSCOPY (EGD);  Surgeon: Juanita Craver, MD;  Location: [facility];  Service: Endoscopy;  Laterality: N/A;   ESOPHAGOGASTRODUODENOSCOPY (EGD) WITH PROPOFOL N/A 06/22/2016   Procedure: ESOPHAGOGASTRODUODENOSCOPY (EGD) WITH PROPOFOL;  Surgeon: Carol Ada, MD;  Location: [facility];  Service: Endoscopy;  Laterality: N/A;   FRACTURE SURGERY     right wrist with screws   HERNIA REPAIR  2008   inguinal   KNEE ARTHROSCOPY Left 06/24/2020   Procedure: ARTHROSCOPY KNEE WITH PARTIAL MEDIAL MENISCECTOMY, CHONDROPLASTY,  OPEN CYSTECTOMY;  Surgeon: Dorna Leitz, MD;  Location: [facility];  Service: Orthopedics;  Laterality: Left;       Home Medications    Prior to Admission medications   Medication Sig Start Date End Date Taking? Authorizing Provider  amoxicillin-clavulanate (AUGMENTIN) 875-125 MG tablet Take 1 tablet by mouth every 12 (twelve) hours. 11/02/22  Yes Volney American, PA-C  HYDROcodone-acetaminophen [facility]) 10-325 MG tablet Take 1-2 tablets by mouth daily as needed (pain.).  06/06/20  Yes [provider]  promethazine-dextromethorphan (PROMETHAZINE-DM) 6.25-15 MG/5ML syrup Take 5 mLs by mouth 4 (four) times daily as needed. 11/02/22  Yes Volney American, PA-C  Acetaminophen-guaiFENesin [facility] COLD & FLU PO) Take by mouth.    [provider]  albuterol (VENTOLIN HFA) 108 (90 Base) MCG/ACT inhaler Inhale 1-2 puffs into the lungs every 6 (six) hours as needed for wheezing or shortness of breath. 04/13/22   Hezzie Bump, NP  atenolol-chlorthalidone [facility])  100-25 MG per tablet Take 1 tablet by mouth daily.    [provider]  atorvastatin (LIPITOR) 20 MG tablet Take 20 mg by mouth every evening.     [provider]  azithromycin (ZITHROMAX) 250 MG tablet Take 1 tablet (250 mg total) by mouth daily. Take first 2 tablets together, then 1 every day until finished. 04/29/22   Francene Finders, PA-C   benzocaine (AMERICAINE) 20 % rectal ointment Place rectally every 3 (three) hours as needed for pain. 10/01/22   Leath-Warren, Alda Lea, NP  busPIRone (BUSPAR) 10 MG tablet Take 10 mg by mouth daily.  07/07/15   [provider]  Canagliflozin-Metformin HCl ER (INVOKAMET XR) 50-500 MG TB24 Take 2 tablets by mouth daily before breakfast. Patient taking differently: Take 1 tablet by mouth in the morning and at bedtime. 07/02/16   Philemon Kingdom, MD  Dapagliflozin Prop-metFORMIN (XIGDUO XR PO) Take by mouth.    [provider]  docusate sodium (COLACE) 100 MG capsule Take 1 capsule (100 mg total) by mouth every 12 (twelve) hours. 10/01/22   Leath-Warren, Alda Lea, NP  doxycycline (VIBRAMYCIN) 100 MG capsule Take 1 capsule (100 mg total) by mouth 2 (two) times daily. 04/13/22   Hezzie Bump, NP  Eszopiclone 3 MG TABS Take 3 mg by mouth at bedtime. 09/26/22   [provider]  ferrous sulfate 325 (65 FE) MG tablet Take 325 mg by mouth daily.    [provider]  fluticasone (FLONASE) 50 MCG/ACT nasal spray Place 1 spray into both nostrils daily for 14 days. 04/06/20 05/12/21  Avegno, Darrelyn Hillock, FNP  gabapentin (NEURONTIN) 300 MG capsule Take 300 mg by mouth as needed.    [provider]  glucose blood test strip Use to check blood sugar once a day. 07/02/16   Philemon Kingdom, MD  hydrocortisone (ANUSOL-HC) 25 MG suppository Place 1 suppository (25 mg total) rectally 2 (two) times daily. 10/01/22   Leath-Warren, Alda Lea, NP  hydrOXYzine (ATARAX/VISTARIL) 25 MG tablet Take 25 mg by mouth at bedtime. 07/04/15   [provider]  lisinopril (PRINIVIL,ZESTRIL) 2.5 MG tablet Take 2.5 mg by mouth every evening.     [provider]  LORazepam (ATIVAN) 0.5 MG tablet Take 0.5 mg by mouth at bedtime.    [provider]  meloxicam (MOBIC) 15 MG tablet Take 15 mg by mouth daily. 06/03/20   [provider]  MICROLET LANCETS MISC Use to  check blood sugar. 07/02/16   Philemon Kingdom, MD  [facility] 10 MG/0.5ML Pen 0.5 ml Subcutaneous 0nce a week for 30 days Patient not taking: Reported on 11/02/2022 09/24/22   [provider]  Multiple Vitamin (MULTIVITAMIN WITH MINERALS) TABS tablet Take 1 tablet by mouth daily. Multivitamin for Adults 50+ (Iron Free)    [provider]  Multiple Vitamins-Minerals (IMMUNE SYSTEM BOOSTER PO) Take 2 tablets by mouth daily. Chewables    [provider]  Omega-3 Fatty Acids (FISH OIL) 1000 MG CAPS Take 1,000 mg by mouth at bedtime.    [provider]  omeprazole (PRILOSEC) 40 MG capsule Take 40 mg by mouth 2 (two) times daily. 05/09/20   [provider]  ondansetron (ZOFRAN-ODT) 4 MG disintegrating tablet Take 1 tablet (4 mg total) by mouth every 8 (eight) hours as needed for nausea or vomiting. 08/01/21   Volney American, PA-C  oseltamivir (TAMIFLU) 75 MG capsule Take 1 capsule (75 mg total) by mouth every 12 (twelve) hours. 08/01/21  Volney American, PA-C  promethazine-dextromethorphan (PROMETHAZINE-DM) 6.25-15 MG/5ML syrup Take 5 mLs by mouth 4 (four) times daily as needed for cough. 04/29/22   Francene Finders, PA-C  rifaximin (XIFAXAN) 200 MG tablet Take 200 mg by mouth 3 (three) times daily.    [provider]  RYBELSUS 7 MG TABS Take 7 mg by mouth daily.  04/29/20   [provider]  timolol (TIMOPTIC) 0.5 % ophthalmic solution Place 1 drop into the left eye 2 (two) times daily. 05/06/20   [provider]  tizanidine (ZANAFLEX) 2 MG capsule Take 2 mg by mouth 3 (three) times daily. As needed.    [provider]  tiZANidine (ZANAFLEX) 4 MG tablet Take 4 mg by mouth at bedtime. 04/29/20   [provider]  TURMERIC PO Take 2 capsules by mouth daily.    [provider]  zolpidem (AMBIEN) 10 MG tablet Take 10 mg by mouth at bedtime. 05/06/20   [provider]  liraglutide (VICTOZA) 18  MG/3ML SOPN Inject 1.2 mg into the skin every evening.  11/02/19  [provider]    Family History Family History  Problem Relation Age of Onset   Diabetes Mother    Hypertension Mother    Hyperlipidemia Mother    Cancer Father    Thyroid disease Sister     Social History Social History   Tobacco Use   Smoking status: Never   Smokeless tobacco: Never  Vaping Use   Vaping Use: Never used  Substance Use Topics   Alcohol use: Yes    Comment: rarely   Drug use: Yes    Types: Marijuana    Comment: rare marijuana use only last used 3 months ago     Allergies   Lactose intolerance (gi), Trazodone hcl, Trulicity [dulaglutide], and Metformin and related   Review of Systems Review of Systems PER HPI  Physical Exam Triage Vital Signs ED Triage Vitals  Enc Vitals Group     BP 11/02/22 1453 113/74     Pulse Rate 11/02/22 1453 83     Resp 11/02/22 1453 20     Temp 11/02/22 1453 99.2 F (37.3 C)     Temp Source 11/02/22 1453 Oral     SpO2 11/02/22 1453 94 %     Weight --      Height --      Head Circumference --      Peak Flow --      Pain Score 11/02/22 1446 4     Pain Loc --      Pain Edu? --      Excl. in [place]? --    No data found.  Updated Vital Signs BP 113/74 (BP Location: Right Arm)   Pulse 83   Temp 99.2 F (37.3 C) (Oral)   Resp 20   SpO2 94%   Visual Acuity Right Eye Distance:   Left Eye Distance:   Bilateral Distance:    Right Eye Near:   Left Eye Near:    Bilateral Near:     Physical Exam Vitals and nursing note reviewed.  Constitutional:      Appearance: He is well-developed.  HENT:     Head: Atraumatic.     Right Ear: External ear normal.     Left Ear: External ear normal.     Nose: Congestion present.     Mouth/Throat:     Pharynx: Posterior oropharyngeal erythema present. No oropharyngeal exudate.  Eyes:  Conjunctiva/sclera: Conjunctivae normal.     Pupils: Pupils are equal, round, and reactive to light.   Cardiovascular:     Rate and Rhythm: Normal rate and regular rhythm.  Pulmonary:     Effort: Pulmonary effort is normal. No respiratory distress.     Breath sounds: No wheezing or rales.  Musculoskeletal:        General: Normal range of motion.     Cervical back: Normal range of motion and neck supple.  Lymphadenopathy:     Cervical: No cervical adenopathy.  Skin:    General: Skin is warm and dry.  Neurological:     Mental Status: He is alert and oriented to person, place, and time.  Psychiatric:        Behavior: Behavior normal.      UC Treatments / Results  Labs (all labs ordered are listed, but only abnormal results are displayed) Labs Reviewed - No data to display  EKG   Radiology No results found.  Procedures Procedures (including critical care time)  Medications Ordered in UC Medications - No data to display  Initial Impression / Assessment and Plan / UC Course  I have reviewed the triage vital signs and the nursing notes.  Pertinent labs & imaging results that were available during my care of the patient were reviewed by me and considered in my medical decision making (see chart for details).     Will treat for sinusitis given duration and worsening course with Augmentin, Phenergan DM, Flonase, sinus rinses, supportive home care.  Return for worsening symptoms.  Final Clinical Impressions(s) / UC Diagnoses   Final diagnoses:  Acute non-recurrent maxillary sinusitis     Discharge Instructions      Perform sinus rinses with warm saline several times daily until feeling much better.  Flonase twice daily, over-the-counter cold and congestion medications as needed and antihistamines daily.  I have also sent in a course of antibiotics, complete the whole course.    ED Prescriptions     Medication Sig Dispense Auth. Provider   amoxicillin-clavulanate (AUGMENTIN) 875-125 MG tablet Take 1 tablet by mouth every 12 (twelve) hours. 14 tablet Volney American, [place]   promethazine-dextromethorphan (PROMETHAZINE-DM) 6.25-15 MG/5ML syrup Take 5 mLs by mouth 4 (four) times daily as needed. 100 mL Volney American, [place]      PDMP not reviewed this encounter.   Volney American, [place] 11/02/22 1525

## 2022-11-02 NOTE — ED Triage Notes (Signed)
Pt reports sneezing, coughing, body ache x1 week and reports a few days ago increased nasal congestion. Denies fever. Home covid test negative.

## 2022-11-02 NOTE — Discharge Instructions (Signed)
Perform sinus rinses with warm saline several times daily until feeling much better.  Flonase twice daily, over-the-counter cold and congestion medications as needed and antihistamines daily.  I have also sent in a course of antibiotics, complete the whole course.

## 2022-11-20 ENCOUNTER — Ambulatory Visit
Admission: RE | Admit: 2022-11-20 | Discharge: 2022-11-20 | Disposition: A | Discharge status: Home / Self Care | Payer: 59 | Source: Ambulatory Visit | Attending: Nurse Practitioner | Admitting: Nurse Practitioner

## 2022-11-20 VITALS — BP 119/74 | HR 71 | Temp 98.6°F | Resp 20

## 2022-11-20 DIAGNOSIS — J029 Acute pharyngitis, unspecified: Secondary | ICD-10-CM | POA: Insufficient documentation

## 2022-11-20 DIAGNOSIS — J039 Acute tonsillitis, unspecified: Secondary | ICD-10-CM

## 2022-11-20 LAB — POCT RAPID STREP A (OFFICE): Rapid Strep A Screen: NEGATIVE

## 2022-11-20 MED ORDER — CEPHALEXIN 500 MG PO CAPS: 500.0000 mg | ORAL_CAPSULE | Freq: Two times a day (BID) | ORAL | 0 refills | Status: AC

## 2022-11-20 MED ORDER — LIDOCAINE VISCOUS HCL 2 % MT SOLN: 15.0000 mL | OROMUCOSAL | 0 refills | Status: DC | PRN

## 2022-11-20 NOTE — Discharge Instructions (Signed)
The rapid strep throat test today is negative.  The throat culture is pending.  While we are waiting for the throat culture to come back, please start the Keflex to treat the bacteria that I think is causing your symptoms.  You can also use a lidocaine rinses as needed for throat pain.  Make sure you change your toothbrush today or tomorrow to prevent reinfection.  You are considered contagious until you have been on the antibiotics for 24 hours.

## 2022-11-20 NOTE — ED Triage Notes (Signed)
Pt reports he is having throat pain, swollen lymph nodes, and hard to swallow x 2 days. Took ibuprofen which gave some relief.

## 2022-11-20 NOTE — ED Provider Notes (Signed)
[facility]    CSN: [id number] Arrival date & time: 11/20/22  4098      History   Chief Complaint Chief Complaint  Patient presents with   Sore Throat    Entered by patient    HPI Scott [name] is a 57 y.o. male.   Patient presents today for 2-day history of tactile fevers, body aches and chills, sore throat, right-sided swollen lymph nodes in his neck, and 5 episodes of diarrhea yesterday.  Reports his appetite has been decreased because it is hard to swallow.  Has also been more tired than normal.,  Shortness of breath, chest pain, stuffy nose or runny nose, abdominal pain, nausea, or vomiting.  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  He took ibuprofen which did seem to help with the fever.  Patient reports approximately 3 weeks ago, he was treated for a sinus infection with Augmentin and symptoms have fully improved.    Past Medical History:  Diagnosis Date   Anemia    Anxiety    Arthritis    Back pain    pinched nerve sciatica   Diabetes mellitus without complication    type  2   Elevated cholesterol with elevated triglycerides    Elevated liver enzymes 2014   normal now   GERD (gastroesophageal reflux disease)    Hypertension    Hypertension    Sleep apnea    uses cpap,setting of 14    Patient Active Problem List   Diagnosis Date Noted   Palpitations 02/21/2016    Past Surgical History:  Procedure Laterality Date   CATARACT EXTRACTION Left    COLONOSCOPY N/A 09/29/2012   Procedure: COLONOSCOPY;  Surgeon: Charna Elizabeth, MD;  Location: [facility];  Service: Endoscopy;  Laterality: N/A;   COLONOSCOPY WITH PROPOFOL N/A 06/22/2016   Procedure: COLONOSCOPY WITH PROPOFOL;  Surgeon: Jeani Hawking, MD;  Location: [facility];  Service: Endoscopy;  Laterality: N/A;   COLONOSCOPY WITH PROPOFOL N/A 05/20/2020   Procedure: COLONOSCOPY WITH PROPOFOL;  Surgeon: Jeani Hawking, MD;   Location: [facility];  Service: Endoscopy;  Laterality: N/A;   ENTEROSCOPY N/A 05/20/2020   Procedure: ENTEROSCOPY;  Surgeon: Jeani Hawking, MD;  Location: [facility];  Service: Endoscopy;  Laterality: N/A;   ESOPHAGOGASTRODUODENOSCOPY N/A 09/29/2012   Procedure: ESOPHAGOGASTRODUODENOSCOPY (EGD);  Surgeon: Charna Elizabeth, MD;  Location: [facility];  Service: Endoscopy;  Laterality: N/A;   ESOPHAGOGASTRODUODENOSCOPY (EGD) WITH PROPOFOL N/A 06/22/2016   Procedure: ESOPHAGOGASTRODUODENOSCOPY (EGD) WITH PROPOFOL;  Surgeon: Jeani Hawking, MD;  Location: [facility];  Service: Endoscopy;  Laterality: N/A;   FRACTURE SURGERY     right wrist with screws   HERNIA REPAIR  2008   inguinal   KNEE ARTHROSCOPY Left 06/24/2020   Procedure: ARTHROSCOPY KNEE WITH PARTIAL MEDIAL MENISCECTOMY, CHONDROPLASTY,  OPEN CYSTECTOMY;  Surgeon: Jodi Geralds, MD;  Location: [facility];  Service: Orthopedics;  Laterality: Left;       Home Medications    Prior to Admission medications   Medication Sig Start Date End Date Taking? Authorizing Provider  Acetaminophen-guaiFENesin [facility] COLD & FLU PO) Take by mouth.    [provider]  albuterol (VENTOLIN HFA) 108 (90 Base) MCG/ACT inhaler Inhale 1-2 puffs into the lungs every 6 (six) hours as needed for wheezing or shortness of breath. 04/13/22   Jone Baseman, NP  atenolol-chlorthalidone (TENORETIC) 100-25 MG per tablet Take 1 tablet by mouth daily.    [provider]  atorvastatin (LIPITOR) 20 MG tablet Take 20 mg by mouth every evening.     [provider]  benzocaine (AMERICAINE) 20 % rectal ointment Place rectally every 3 (three) hours as needed for pain. 10/01/22   Leath-Warren, Sadie Haber, NP  busPIRone (BUSPAR) 10 MG tablet Take 10 mg by mouth daily.  07/07/15   [provider]  Canagliflozin-Metformin HCl ER (INVOKAMET XR) 50-500 MG TB24 Take 2 tablets by mouth daily before breakfast. Patient taking differently: Take 1 tablet by  mouth in the morning and at bedtime. 07/02/16   Carlus Pavlov, MD  cephALEXin (KEFLEX) 500 MG capsule Take 1 capsule (500 mg total) by mouth 2 (two) times daily for 10 days. 11/20/22 11/30/22 Yes Valentino Nose, NP  Dapagliflozin Prop-metFORMIN (XIGDUO XR PO) Take by mouth.    [provider]  docusate sodium (COLACE) 100 MG capsule Take 1 capsule (100 mg total) by mouth every 12 (twelve) hours. 10/01/22   Leath-Warren, Sadie Haber, NP  Eszopiclone 3 MG TABS Take 3 mg by mouth at bedtime. 09/26/22   [provider]  ferrous sulfate 325 (65 FE) MG tablet Take 325 mg by mouth daily.    [provider]  fluticasone (FLONASE) 50 MCG/ACT nasal spray Place 1 spray into both nostrils daily for 14 days. 04/06/20 05/12/21  Avegno, Zachery Dakins, FNP  gabapentin (NEURONTIN) 300 MG capsule Take 300 mg by mouth as needed.    [provider]  glucose blood test strip Use to check blood sugar once a day. 07/02/16   Carlus Pavlov, MD  HYDROcodone-acetaminophen (NORCO) 10-325 MG tablet Take 1-2 tablets by mouth daily as needed (pain.).  06/06/20   [provider]  hydrocortisone (ANUSOL-HC) 25 MG suppository Place 1 suppository (25 mg total) rectally 2 (two) times daily. 10/01/22   Leath-Warren, Sadie Haber, NP  hydrOXYzine (ATARAX/VISTARIL) 25 MG tablet Take 25 mg by mouth at bedtime. 07/04/15   [provider]  lidocaine (XYLOCAINE) 2 % solution Use as directed 15 mLs in the mouth or throat every 3 (three) hours as needed for mouth pain. Gargle and spit as needed for throat pain 11/20/22  Yes Cathlean Marseilles A, NP  lisinopril (PRINIVIL,ZESTRIL) 2.5 MG tablet Take 2.5 mg by mouth every evening.     [provider]  LORazepam (ATIVAN) 0.5 MG tablet Take 0.5 mg by mouth at bedtime.    [provider]  meloxicam (MOBIC) 15 MG tablet Take 15 mg by mouth daily. 06/03/20   [provider]  MICROLET LANCETS MISC Use to check blood sugar.  07/02/16   Carlus Pavlov, MD  [facility] 10 MG/0.5ML Pen 0.5 ml Subcutaneous 0nce a week for 30 days Patient not taking: Reported on 11/02/2022 09/24/22   [provider]  Multiple Vitamin (MULTIVITAMIN WITH MINERALS) TABS tablet Take 1 tablet by mouth daily. Multivitamin for Adults 50+ (Iron Free)    [provider]  Multiple Vitamins-Minerals (IMMUNE SYSTEM BOOSTER PO) Take 2 tablets by mouth daily. Chewables    [provider]  Omega-3 Fatty Acids (FISH OIL) 1000 MG CAPS Take 1,000 mg by mouth at bedtime.    [provider]  omeprazole (PRILOSEC) 40 MG capsule Take 40 mg by mouth 2 (two) times daily. 05/09/20   [provider]  ondansetron (ZOFRAN-ODT) 4 MG disintegrating tablet Take 1 tablet (4 mg total) by mouth every 8 (eight) hours as needed for nausea or vomiting. 08/01/21   Particia Nearing, PA-C  rifaximin (XIFAXAN) 200 MG tablet  Take 200 mg by mouth 3 (three) times daily.    [provider]  RYBELSUS 7 MG TABS Take 7 mg by mouth daily.  04/29/20   [provider]  timolol (TIMOPTIC) 0.5 % ophthalmic solution Place 1 drop into the left eye 2 (two) times daily. 05/06/20   [provider]  tizanidine (ZANAFLEX) 2 MG capsule Take 2 mg by mouth 3 (three) times daily. As needed.    [provider]  tiZANidine (ZANAFLEX) 4 MG tablet Take 4 mg by mouth at bedtime. 04/29/20   [provider]  TURMERIC PO Take 2 capsules by mouth daily.    [provider]  zolpidem (AMBIEN) 10 MG tablet Take 10 mg by mouth at bedtime. 05/06/20   [provider]  liraglutide (VICTOZA) 18 MG/3ML SOPN Inject 1.2 mg into the skin every evening.  11/02/19  [provider]    Family History Family History  Problem Relation Age of Onset   Diabetes Mother    Hypertension Mother    Hyperlipidemia Mother    Cancer Father    Thyroid disease Sister     Social History Social History   Tobacco Use    Smoking status: Never   Smokeless tobacco: Never  Vaping Use   Vaping Use: Never used  Substance Use Topics   Alcohol use: Yes    Comment: rarely   Drug use: Yes    Types: Marijuana    Comment: rare marijuana use only last used 3 months ago     Allergies   Lactose intolerance (gi), Trazodone hcl, Trulicity [dulaglutide], and Metformin and related   Review of Systems Review of Systems Per HPI  Physical Exam Triage Vital Signs ED Triage Vitals  Enc Vitals Group     BP 11/20/22 0946 119/74     Pulse Rate 11/20/22 0946 71     Resp 11/20/22 0946 20     Temp 11/20/22 0946 98.6 F (37 C)     Temp Source 11/20/22 0946 Oral     SpO2 11/20/22 0946 96 %     Weight --      Height --      Head Circumference --      Peak Flow --      Pain Score 11/20/22 0947 7     Pain Loc --      Pain Edu? --      Excl. in GC? --    No data found.  Updated Vital Signs BP 119/74 (BP Location: Right Arm)   Pulse 71   Temp 98.6 F (37 C) (Oral)   Resp 20   SpO2 96%   Visual Acuity Right Eye Distance:   Left Eye Distance:   Bilateral Distance:    Right Eye Near:   Left Eye Near:    Bilateral Near:     Physical Exam Vitals and nursing note reviewed.  Constitutional:      General: He is not in acute distress.    Appearance: He is well-developed. He is not ill-appearing, toxic-appearing or diaphoretic.  HENT:     Head: Normocephalic and atraumatic.     Right Ear: Tympanic membrane and ear canal normal. No drainage, swelling or tenderness. No middle ear effusion. Tympanic membrane is not erythematous.     Left Ear: Tympanic membrane and ear canal normal. No drainage, swelling or tenderness.  No middle ear effusion. Tympanic membrane is not erythematous.     Nose: No congestion or rhinorrhea.  Mouth/Throat:     Mouth: Mucous membranes are dry.     Pharynx: Posterior oropharyngeal erythema present. No uvula swelling.     Tonsils: Tonsillar exudate present. No tonsillar abscesses.  2+ on the right. 1+ on the left.  Eyes:     Conjunctiva/sclera: Conjunctivae normal.  Cardiovascular:     Rate and Rhythm: Tachycardia present.  Pulmonary:     Effort: Pulmonary effort is normal. No respiratory distress.     Breath sounds: Normal breath sounds. No wheezing, rhonchi or rales.  Musculoskeletal:     Cervical back: Neck supple.  Lymphadenopathy:     Cervical: Cervical adenopathy present.  Skin:    General: Skin is warm and dry.     Coloration: Skin is not pale.     Findings: No erythema or rash.  Neurological:     Mental Status: He is alert and oriented to person, place, and time.      UC Treatments / Results  Labs (all labs ordered are listed, but only abnormal results are displayed) Labs Reviewed  CULTURE, GROUP A STREP [facility])  POCT RAPID STREP A (OFFICE)    EKG   Radiology No results found.  Procedures Procedures (including critical care time)  Medications Ordered in UC Medications - No data to display  Initial Impression / Assessment and Plan / UC Course  I have reviewed the triage vital signs and the nursing notes.  Pertinent labs & imaging results that were available during my care of the patient were reviewed by me and considered in my medical decision making (see chart for details).   Patient is well-appearing, normotensive, afebrile, not tachycardic, not tachypneic, oxygenating well on room air.    1. Acute pharyngitis, unspecified etiology 2. Acute tonsillitis, unspecified etiology Rapid strep throat test today is negative Throat culture is pending Given symptoms and examination, will treat empirically for strep throat with Keflex twice daily for 10 days Also start lidocaine rinses Change toothbrush after starting treatment Seek care for persistent or worsening symptoms despite treatment  The patient was given the opportunity to ask questions.  All questions answered to their satisfaction.  The patient is in agreement to this plan.     Final Clinical Impressions(s) / UC Diagnoses   Final diagnoses:  Acute pharyngitis, unspecified etiology  Acute tonsillitis, unspecified etiology     Discharge Instructions      The rapid strep throat test today is negative.  The throat culture is pending.  While we are waiting for the throat culture to come back, please start the Keflex to treat the bacteria that I think is causing your symptoms.  You can also use a lidocaine rinses as needed for throat pain.  Make sure you change your toothbrush today or tomorrow to prevent reinfection.  You are considered contagious until you have been on the antibiotics for 24 hours.     ED Prescriptions     Medication Sig Dispense Auth. Provider   cephALEXin (KEFLEX) 500 MG capsule Take 1 capsule (500 mg total) by mouth 2 (two) times daily for 10 days. 20 capsule Cathlean Marseilles A, NP   lidocaine (XYLOCAINE) 2 % solution Use as directed 15 mLs in the mouth or throat every 3 (three) hours as needed for mouth pain. Gargle and spit as needed for throat pain 100 mL Valentino Nose, NP      PDMP not reviewed this encounter.   Valentino Nose, NP 11/20/22 1122

## 2022-11-23 LAB — CULTURE, GROUP A STREP (THRC)

## 2022-12-17 ENCOUNTER — Encounter (INDEPENDENT_AMBULATORY_CARE_PROVIDER_SITE_OTHER): Payer: Self-pay | Admitting: *Deleted

## 2022-12-24 ENCOUNTER — Ambulatory Visit (INDEPENDENT_AMBULATORY_CARE_PROVIDER_SITE_OTHER): Payer: 59 | Admitting: Gastroenterology

## 2022-12-24 ENCOUNTER — Encounter (INDEPENDENT_AMBULATORY_CARE_PROVIDER_SITE_OTHER): Payer: Self-pay | Admitting: Gastroenterology

## 2022-12-24 VITALS — BP 129/79 | HR 75 | Temp 97.5°F | Ht 73.0 in | Wt 293.1 lb

## 2022-12-24 DIAGNOSIS — K580 Irritable bowel syndrome with diarrhea: Secondary | ICD-10-CM

## 2022-12-24 DIAGNOSIS — K625 Hemorrhage of anus and rectum: Secondary | ICD-10-CM | POA: Diagnosis not present

## 2022-12-24 DIAGNOSIS — K589 Irritable bowel syndrome without diarrhea: Secondary | ICD-10-CM | POA: Insufficient documentation

## 2022-12-24 MED ORDER — PEG 3350-KCL-NA BICARB-NACL 420 G PO SOLR
4000.0000 mL | Freq: Once | ORAL | 0 refills | Status: AC
Start: 2022-12-24 — End: 2022-12-24

## 2022-12-24 NOTE — Patient Instructions (Addendum)
Schedule colonoscopy If colonoscopy is unremarkable or if there is no presence of significant internal hemorrhoids, we will schedule a capsule endoscopy Continue Imodium as needed for diarrhea, can take 1 pill every day and take further doses as needed to control diarrhea up to 6 pills/day Keep the food diary, try to avoid meals that worsen your diarrhea

## 2022-12-24 NOTE — Progress Notes (Unsigned)
[name], M.D. Gastroenterology & Hepatology [facility] Gastroenterology [address] [place], [place] [address] Primary Care Physician: Johny Blamer, MD [address] [place] [address]  Referring MD: PCP  Chief Complaint: Abdominal pain, diarrhea, rectal bleeding  History of Present Illness: Azarel Mcniff is a 57 y.o. male from [place] with PMH GERD, IBS, anxiety, diabetes, hypertension, hyperlipidemia, OSA, who presents for evaluation of abdominal pain, diarrhea and rectal bleeding.  Patient reports that for the last 10 years he has had recurrent episodes of rectal bleeding. He reports that the bleeding is getting worse with time and now is very severe. He is currently having fresh blood with clots  every time he moves his bowels. He states that sometimes he has some pain when he defecates. He is using Anusol on a daily basis. He reports having a chronic history of diarrhea since he was a teenager - He has 5 Bms per day on average, stools are usually watery to soft. Sometimes he feels bloated. When he eats lactose, he will have discomfort. He remembers once he had a  rectal prolapse that resolved on its own.  He is mostly concerned as the blood was mixed with the stool and did not come in a separate fashion.  He reports that he uses omeprazole 40 mg BID for GERD. Denies any dysphagia or heartburn with this combination, which has been prescribed by her PCP.  The patient denies having any nausea, vomiting, fever, chills,, melena, hematemesis, abdominal distention, abdominal pain, diarrhea, jaundice, pruritus or weight loss.  Patient was previously following in [place] with Dr. Jeani Hawking for episodes of hematochezia and iron deficiency anemia.  Patient underwent previous endoscopic investigations by Dr. Manuela Neptune, was found to have internal and external hemorrhoids.  Due to this, he underwent internal hemorrhoidal banding x2 but  the bleeding did not improve substantially. He was told he was going to be referred to a surgeon but never heard back about this and he had a change in his insurance plan.States he was started on Viberzi but developed anxiety and developed constipation with it. Reports he took Xifaxan in the past without improvement. He takes Imodium 1 tablet every day, which he feels has helped cope with the symptoms.  Last EGD/push enteroscopy: 05/20/2020 performed by Jeani Hawking, normal esophagus stomach, duodenum and jejunum. Last Colonoscopy: 05/20/2020 performed by Dr. Jeani Hawking Diverticulosis in the sigmoid colon, internal hemorrhoids.  FHx: neg for any gastrointestinal/liver disease,father liver cancer, aunts, uncle and sister skin cancer Social: neg smoking, alcohol or illicit drug use Surgical: inguinal hernia  Past Medical History: Past Medical History:  Diagnosis Date   Anemia    Anxiety    Arthritis    Back pain    pinched nerve sciatica   Diabetes mellitus without complication (HCC)    type  2   Elevated cholesterol with elevated triglycerides    Elevated liver enzymes 2014   normal now   GERD (gastroesophageal reflux disease)    Hypertension    Hypertension    Sleep apnea    uses cpap,setting of 14    Past Surgical History: Past Surgical History:  Procedure Laterality Date   CATARACT EXTRACTION Left    COLONOSCOPY N/A 09/29/2012   Procedure: COLONOSCOPY;  Surgeon: Charna Elizabeth, MD;  Location: [facility];  Service: Endoscopy;  Laterality: N/A;   COLONOSCOPY WITH PROPOFOL N/A 06/22/2016   Procedure: COLONOSCOPY WITH PROPOFOL;  Surgeon: Jeani Hawking, MD;  Location: [facility];  Service: Endoscopy;  Laterality: N/A;   COLONOSCOPY WITH PROPOFOL N/A 05/20/2020   Procedure: COLONOSCOPY WITH PROPOFOL;  Surgeon: Jeani Hawking, MD;  Location: [facility];  Service: Endoscopy;  Laterality: N/A;   ENTEROSCOPY N/A 05/20/2020   Procedure: ENTEROSCOPY;  Surgeon: Jeani Hawking, MD;   Location: [facility];  Service: Endoscopy;  Laterality: N/A;   ESOPHAGOGASTRODUODENOSCOPY N/A 09/29/2012   Procedure: ESOPHAGOGASTRODUODENOSCOPY (EGD);  Surgeon: Charna Elizabeth, MD;  Location: [facility];  Service: Endoscopy;  Laterality: N/A;   ESOPHAGOGASTRODUODENOSCOPY (EGD) WITH PROPOFOL N/A 06/22/2016   Procedure: ESOPHAGOGASTRODUODENOSCOPY (EGD) WITH PROPOFOL;  Surgeon: Jeani Hawking, MD;  Location: [facility];  Service: Endoscopy;  Laterality: N/A;   FRACTURE SURGERY     right wrist with screws   HERNIA REPAIR  2008   inguinal   KNEE ARTHROSCOPY Left 06/24/2020   Procedure: ARTHROSCOPY KNEE WITH PARTIAL MEDIAL MENISCECTOMY, CHONDROPLASTY,  OPEN CYSTECTOMY;  Surgeon: Jodi Geralds, MD;  Location: [facility];  Service: Orthopedics;  Laterality: Left;    Family History: Family History  Problem Relation Age of Onset   Diabetes Mother    Hypertension Mother    Hyperlipidemia Mother    Cancer Father    Thyroid disease Sister     Social History: Social History   Tobacco Use  Smoking Status Never  Smokeless Tobacco Never   Social History   Substance and Sexual Activity  Alcohol Use Yes   Comment: rarely   Social History   Substance and Sexual Activity  Drug Use Not Currently   Types: Marijuana   Comment: rare marijuana use only last used 3 months ago    Allergies: Allergies  Allergen Reactions   Lactose Intolerance (Gi)    Trazodone Hcl     Other reaction(s): rapid heartbeat   Trulicity [Dulaglutide] Other (See Comments)    Weakness - severe   Metformin And Related Diarrhea    Diarrhea not frequent    Medications: Current Outpatient Medications  Medication Sig Dispense Refill   atenolol-chlorthalidone (TENORETIC) 100-25 MG per tablet Take 1 tablet by mouth daily.     atorvastatin (LIPITOR) 20 MG tablet Take 20 mg by mouth every evening.      benzocaine (AMERICAINE) 20 % rectal ointment Place rectally every 3 (three) hours as needed for pain. 56 g 0   busPIRone  (BUSPAR) 10 MG tablet Take 10 mg by mouth daily.   0   Dapagliflozin Prop-metFORMIN (XIGDUO XR PO) Take by mouth daily at 6 (six) AM.     eszopiclone (LUNESTA) 1 MG TABS tablet Take 3 mg by mouth at bedtime as needed for sleep. Take immediately before bedtime     ferrous sulfate 325 (65 FE) MG tablet Take 325 mg by mouth daily.     fluticasone (FLONASE) 50 MCG/ACT nasal spray Place 1 spray into both nostrils daily for 14 days. 16 g 0   gabapentin (NEURONTIN) 300 MG capsule Take 300 mg by mouth as needed.     glucose blood test strip Use to check blood sugar once a day. 100 each 12   HYDROcodone-acetaminophen (NORCO) 10-325 MG tablet Take 1-2 tablets by mouth daily as needed (pain.).      hydrocortisone (ANUSOL-HC) 25 MG suppository Place 1 suppository (25 mg total) rectally 2 (two) times daily. 24 suppository 1   hydrOXYzine (ATARAX/VISTARIL) 25 MG tablet Take 25 mg by mouth at bedtime.  0   lisinopril (PRINIVIL,ZESTRIL) 2.5 MG tablet Take 2.5 mg by mouth every evening.      LORazepam (ATIVAN) 0.5  MG tablet Take 0.5 mg by mouth at bedtime.     MICROLET LANCETS MISC Use to check blood sugar. 100 each 2   Multiple Vitamin (MULTIVITAMIN WITH MINERALS) TABS tablet Take 1 tablet by mouth daily. Multivitamin for Adults 50+ (Iron Free)     Multiple Vitamins-Minerals (IMMUNE SYSTEM BOOSTER PO) Take 2 tablets by mouth daily. Chewables     Omega-3 Fatty Acids (FISH OIL) 1000 MG CAPS Take 1,000 mg by mouth at bedtime.     omeprazole (PRILOSEC) 40 MG capsule Take 40 mg by mouth 2 (two) times daily.     RYBELSUS 7 MG TABS Take 7 mg by mouth daily.      timolol (TIMOPTIC) 0.5 % ophthalmic solution Place 1 drop into the left eye 2 (two) times daily.     tiZANidine (ZANAFLEX) 4 MG tablet Take 4 mg by mouth at bedtime.     docusate sodium (COLACE) 100 MG capsule Take 1 capsule (100 mg total) by mouth every 12 (twelve) hours. (Patient not taking: Reported on 12/24/2022) 60 capsule 0   No current  facility-administered medications for this visit.    Review of Systems: GENERAL: negative for malaise, night sweats HEENT: No changes in hearing or vision, no nose bleeds or other nasal problems. NECK: Negative for lumps, goiter, pain and significant neck swelling RESPIRATORY: Negative for cough, wheezing CARDIOVASCULAR: Negative for chest pain, leg swelling, palpitations, orthopnea GI: SEE HPI MUSCULOSKELETAL: Negative for joint pain or swelling, back pain, and muscle pain. SKIN: Negative for lesions, rash PSYCH: Negative for sleep disturbance, mood disorder and recent psychosocial stressors. HEMATOLOGY Negative for prolonged bleeding, bruising easily, and swollen nodes. ENDOCRINE: Negative for cold or heat intolerance, polyuria, polydipsia and goiter. NEURO: negative for tremor, gait imbalance, syncope and seizures. The remainder of the review of systems is noncontributory.   Physical Exam: BP 129/79 (BP Location: Left Arm, Patient Position: Sitting, Cuff Size: Large)   Pulse 75   Temp (!) 97.5 F (36.4 C) (Temporal)   Ht 6\' 1"  (1.854 m)   Wt 293 lb 1.6 oz (132.9 kg)   BMI 38.67 kg/m  GENERAL: The patient is AO x3, in no acute distress. HEENT: Head is normocephalic and atraumatic. EOMI are intact. Mouth is well hydrated and without lesions. NECK: Supple. No masses LUNGS: Clear to auscultation. No presence of rhonchi/wheezing/rales. Adequate chest expansion HEART: RRR, normal s1 and s2. ABDOMEN: Soft, nontender, no guarding, no peritoneal signs, and nondistended. BS +. No masses. EXTREMITIES: Without any cyanosis, clubbing, rash, lesions or edema. NEUROLOGIC: AOx3, no focal motor deficit. SKIN: no jaundice, no rashes   Imaging/Labs: as above  I personally reviewed and interpreted the available labs, imaging and endoscopic files.  Impression and Plan: Coulson Ramnarain is a 57 y.o. male from [place] with PMH GERD, IBS, anxiety, diabetes, hypertension, hyperlipidemia,  OSA, who presents for evaluation of abdominal pain, diarrhea and rectal bleeding.  The patient had presented with acute on chronic problems with rectal bleeding, which has been worsening recently.  We discussed the possibility that the symptoms could be caused by hemorrhoidal bleeding, possibly external hemorrhoids.  However we will need to evaluate any other organic etiologies with repeat colonoscopy.  He is agreeable to proceed with this.  We discussed that the plan may include a capsule endoscopy if there is no presence of significant internal hemorrhoids on endoscopic evaluation.  He will need to do a capsule endoscopy at that point.  If although bleeding investigations points toward external hemorrhoids, he may be  referred for evaluation by general surgery.  The patient has presented chronic diarrhea for multiple years without any red flag signs.  Has failed Xifaxan in the past and did not tolerate Viberzi.  I will recommend continue with Imodium as needed as this seems to have controlled his symptoms at certain point.  I also advised him to try to keep a diary of his food intolerance.  Patient understood and agreed.  -Schedule colonoscopy -If colonoscopy is unremarkable or if there is no presence of significant internal hemorrhoids, we will schedule a capsule endoscopy -Continue Imodium as needed for diarrhea, can take 1 pill every day and take further doses as needed to control diarrhea up to 6 pills/day -Keep the food diary, try to avoid meals that worsen your diarrhea  All questions were answered.      [name], MD Gastroenterology and Hepatology [facility] Gastroenterology

## 2022-12-25 ENCOUNTER — Telehealth (INDEPENDENT_AMBULATORY_CARE_PROVIDER_SITE_OTHER): Payer: Self-pay | Admitting: Gastroenterology

## 2022-12-25 ENCOUNTER — Encounter (INDEPENDENT_AMBULATORY_CARE_PROVIDER_SITE_OTHER): Payer: Self-pay

## 2022-12-25 NOTE — Telephone Encounter (Signed)
Contacted [facility] to see if PA required for TCS. No PA is required.  Also need to change pt pre op. [place]uled for [place]15[place]23 [place]. Sent secure chat to [place] to see if we could get it changed. Will send letter once pre op is arranged.

## 2022-12-27 ENCOUNTER — Encounter (INDEPENDENT_AMBULATORY_CARE_PROVIDER_SITE_OTHER): Payer: Self-pay

## 2022-12-27 ENCOUNTER — Telehealth (INDEPENDENT_AMBULATORY_CARE_PROVIDER_SITE_OTHER): Payer: Self-pay | Admitting: Gastroenterology

## 2022-12-27 NOTE — Telephone Encounter (Signed)
Pt called into office and wanted move procedure time to afternoon. Pt has been moved down to 1:15 pm on 02/19/23 (originally on for 11:00am). Updated instructions sent to pt via my chart

## 2023-02-14 NOTE — Patient Instructions (Signed)
Scott [name]  02/14/2023     @PREFPERIOPPHARMACY @   Your procedure is scheduled on  02/19/2023.   Report to [facility] Haw[facility] at  [phone]  A.M.   Call this number if you have problems the morning of surgery:  [phone]  If you experience any cold or flu symptoms such as cough, fever, chills, shortness of breath, etc. between now and your scheduled surgery, please notify us at the above number.   Remember:  Follow the diet and prep instructions given to you by the office.      Your last dose of Dapagliflozin and semaglutide should be on 02/16/2023.     Take these medicines the morning of surgery with A SIP OF WATER        atenolol, buspar, hydroxyzine, ativan, omeprazole.     Do not wear jewelry, make-up or nail polish, including gel polish,  artificial nails, or any other type of covering on natural nails (fingers and  toes).  Do not wear lotions, powders, or perfumes, or deodorant.  Do not shave 48 hours prior to surgery.  Men may shave face and neck.  Do not bring valuables to the hospital.  [facility] is not responsible for any belongings or valuables.  Contacts, dentures or bridgework may not be worn into surgery.  Leave your suitcase in the car.  After surgery it may be brought to your room.  For patients admitted to the hospital, discharge time will be determined by your treatment team.  Patients discharged the day of surgery will not be allowed to drive home and must have someone with them for 24 hours.    Special instructions:   DO NOT smoke tobacco or vape for 24 hours before your procedure.  Please read over the following fact sheets that you were given. Anesthesia Post-op Instructions and Care and Recovery After Surgery      Colonoscopy, Adult, Care After The following information offers guidance on how to care for yourself after your procedure. Your health care provider may also give you more specific instructions. If you have problems or  questions, contact your health care provider. What can I expect after the procedure? After the procedure, it is common to have: A small amount of blood in your stool for 24 hours after the procedure. Some gas. Mild cramping or bloating of your abdomen. Follow these instructions at home: Eating and drinking  Drink enough fluid to keep your urine pale yellow. Follow instructions from your health care provider about eating or drinking restrictions. Resume your normal diet as told by your health care provider. Avoid heavy or fried foods that are hard to digest. Activity Rest as told by your health care provider. Avoid sitting for a long time without moving. Get up to take short walks every 1-2 hours. This is important to improve blood flow and breathing. Ask for help if you feel weak or unsteady. Return to your normal activities as told by your health care provider. Ask your health care provider what activities are safe for you. Managing cramping and bloating  Try walking around when you have cramps or feel bloated. If directed, apply heat to your abdomen as told by your health care provider. Use the heat source that your health care provider recommends, such as a moist heat pack or a heating pad. Place a towel between your skin and the heat source. Leave the heat on for 20-30 minutes. Remove the heat if your skin turns bright  red. This is especially important if you are unable to feel pain, heat, or cold. You have a greater risk of getting burned. General instructions If you were given a sedative during the procedure, it can affect you for several hours. Do not drive or operate machinery until your health care provider says that it is safe. For the first 24 hours after the procedure: Do not sign important documents. Do not drink alcohol. Do your regular daily activities at a slower pace than normal. Eat soft foods that are easy to digest. Take over-the-counter and prescription medicines  only as told by your health care provider. Keep all follow-up visits. This is important. Contact a health care provider if: You have blood in your stool 2-3 days after the procedure. Get help right away if: You have more than a small spotting of blood in your stool. You have large blood clots in your stool. You have swelling of your abdomen. You have nausea or vomiting. You have a fever. You have increasing pain in your abdomen that is not relieved with medicine. These symptoms may be an emergency. Get help right away. Call 911. Do not wait to see if the symptoms will go away. Do not drive yourself to the hospital. Summary After the procedure, it is common to have a small amount of blood in your stool. You may also have mild cramping and bloating of your abdomen. If you were given a sedative during the procedure, it can affect you for several hours. Do not drive or operate machinery until your health care provider says that it is safe. Get help right away if you have a lot of blood in your stool, nausea or vomiting, a fever, or increased pain in your abdomen. This information is not intended to replace advice given to you by your health care provider. Make sure you discuss any questions you have with your health care provider. Document Revised: 09/04/2022 Document Reviewed: 03/15/2021 Elsevier Patient Education  2024 [employer] Monitored Anesthesia Care, Care After The following information offers guidance on how to care for yourself after your procedure. Your health care provider may also give you more specific instructions. If you have problems or questions, contact your health care provider. What can I expect after the procedure? After the procedure, it is common to have: Tiredness. Little or no memory about what happened during or after the procedure. Impaired judgment when it comes to making decisions. Nausea or vomiting. Some trouble with balance. Follow these instructions at  home: For the time period you were told by your health care provider:  Rest. Do not participate in activities where you could fall or become injured. Do not drive or use machinery. Do not drink alcohol. Do not take sleeping pills or medicines that cause drowsiness. Do not make important decisions or sign legal documents. Do not take care of children on your own. Medicines Take over-the-counter and prescription medicines only as told by your health care provider. If you were prescribed antibiotics, take them as told by your health care provider. Do not stop using the antibiotic even if you start to feel better. Eating and drinking Follow instructions from your health care provider about what you may eat and drink. Drink enough fluid to keep your urine pale yellow. If you vomit: Drink clear fluids slowly and in small amounts as you are able. Clear fluids include water, ice chips, low-calorie sports drinks, and fruit juice that has water added to it (diluted fruit juice). Eat light  and bland foods in small amounts as you are able. These foods include bananas, applesauce, rice, lean meats, toast, and crackers. General instructions  Have a responsible adult stay with you for the time you are told. It is important to have someone help care for you until you are awake and alert. If you have sleep apnea, surgery and some medicines can increase your risk for breathing problems. Follow instructions from your health care provider about wearing your sleep device: When you are sleeping. This includes during daytime naps. While taking prescription pain medicines, sleeping medicines, or medicines that make you drowsy. Do not use any products that contain nicotine or tobacco. These products include cigarettes, chewing tobacco, and vaping devices, such as e-cigarettes. If you need help quitting, ask your health care provider. Contact a health care provider if: You feel nauseous or vomit every time you eat  or drink. You feel light-headed. You are still sleepy or having trouble with balance after 24 hours. You get a rash. You have a fever. You have redness or swelling around the IV site. Get help right away if: You have trouble breathing. You have new confusion after you get home. These symptoms may be an emergency. Get help right away. Call 911. Do not wait to see if the symptoms will go away. Do not drive yourself to the hospital. This information is not intended to replace advice given to you by your health care provider. Make sure you discuss any questions you have with your health care provider. Document Revised: 12/18/2021 Document Reviewed: 12/18/2021 Elsevier Patient Education  2024 [employer].

## 2023-02-15 ENCOUNTER — Other Ambulatory Visit (HOSPITAL_COMMUNITY): Payer: 59

## 2023-02-18 ENCOUNTER — Other Ambulatory Visit (INDEPENDENT_AMBULATORY_CARE_PROVIDER_SITE_OTHER): Payer: Self-pay | Admitting: Gastroenterology

## 2023-02-18 ENCOUNTER — Telehealth (INDEPENDENT_AMBULATORY_CARE_PROVIDER_SITE_OTHER): Payer: Self-pay | Admitting: Gastroenterology

## 2023-02-18 ENCOUNTER — Encounter (INDEPENDENT_AMBULATORY_CARE_PROVIDER_SITE_OTHER): Payer: Self-pay

## 2023-02-18 ENCOUNTER — Encounter (HOSPITAL_COMMUNITY)
Admission: RE | Admit: 2023-02-18 | Discharge: 2023-02-18 | Disposition: A | Discharge status: Home / Self Care | Payer: 59 | Source: Ambulatory Visit | Attending: Gastroenterology | Admitting: Gastroenterology

## 2023-02-18 VITALS — BP 113/78 | HR 73 | Temp 97.7°F | Resp 18 | Ht 73.0 in | Wt 293.0 lb

## 2023-02-18 DIAGNOSIS — E876 Hypokalemia: Secondary | ICD-10-CM

## 2023-02-18 DIAGNOSIS — D649 Anemia, unspecified: Secondary | ICD-10-CM | POA: Insufficient documentation

## 2023-02-18 DIAGNOSIS — R002 Palpitations: Secondary | ICD-10-CM | POA: Insufficient documentation

## 2023-02-18 DIAGNOSIS — Z01818 Encounter for other preprocedural examination: Secondary | ICD-10-CM | POA: Diagnosis present

## 2023-02-18 DIAGNOSIS — E119 Type 2 diabetes mellitus without complications: Secondary | ICD-10-CM | POA: Insufficient documentation

## 2023-02-18 DIAGNOSIS — R9431 Abnormal electrocardiogram [ECG] [EKG]: Secondary | ICD-10-CM | POA: Insufficient documentation

## 2023-02-18 LAB — CBC WITH DIFFERENTIAL/PLATELET
Abs Immature Granulocytes: 0.02 10*3/uL (ref 0.00–0.07)
Basophils Absolute: 0 10*3/uL (ref 0.0–0.1)
Basophils Relative: 1 %
Eosinophils Absolute: 0.1 10*3/uL (ref 0.0–0.5)
Eosinophils Relative: 2 %
HCT: 43.3 % (ref 39.0–52.0)
Hemoglobin: 14.7 g/dL (ref 13.0–17.0)
Immature Granulocytes: 0 %
Lymphocytes Relative: 29 %
Lymphs Abs: 1.5 10*3/uL (ref 0.7–4.0)
MCH: 29.5 pg (ref 26.0–34.0)
MCHC: 33.9 g/dL (ref 30.0–36.0)
MCV: 86.8 fL (ref 80.0–100.0)
Monocytes Absolute: 0.5 10*3/uL (ref 0.1–1.0)
Monocytes Relative: 10 %
Neutro Abs: 2.9 10*3/uL (ref 1.7–7.7)
Neutrophils Relative %: 58 %
Platelets: 207 10*3/uL (ref 150–400)
RBC: 4.99 MIL/uL (ref 4.22–5.81)
RDW: 12 % (ref 11.5–15.5)
WBC: 5 10*3/uL (ref 4.0–10.5)
nRBC: 0 % (ref 0.0–0.2)

## 2023-02-18 LAB — BASIC METABOLIC PANEL
Anion gap: 11 (ref 5–15)
BUN: 16 mg/dL (ref 6–20)
CO2: 25 mmol/L (ref 22–32)
Calcium: 8.9 mg/dL (ref 8.9–10.3)
Chloride: 99 mmol/L (ref 98–111)
Creatinine, Ser: 0.71 mg/dL (ref 0.61–1.24)
GFR, Estimated: >60 mL/min (ref >60)
Glucose, Bld: 125 mg/dL — ABNORMAL HIGH (ref 70–99)
Potassium: 3 mmol/L — ABNORMAL LOW (ref 3.5–5.1)
Sodium: 135 mmol/L (ref 135–145)

## 2023-02-18 MED ORDER — POTASSIUM CHLORIDE CRYS ER 20 MEQ PO TBCR: 40.0000 meq | EXTENDED_RELEASE_TABLET | Freq: Every day | ORAL | 0 refills | Status: DC

## 2023-02-18 NOTE — Telephone Encounter (Signed)
From: Elsie Amis, RN  Sent: 02/18/2023   8:57 AM EDT  To: Mindy S Estudillo, CMA; Marlowe Shores, LPN  Subject: change date.                                   Good morning ladies! Scott [name] is here for preop and did not stop his Dapaglifloxin., he took it this morning. Dr Alva Garnet said he will do patients procedure on Wednesday in Endo room 1, while Dr Levon Hedger is here. You guys just need to make sure this is okay with Dr Levon Hedger and get him rescheduled for that day if this is okay. He has everything he needs here, once he ir rescheduled if you let me know , we can call him with new arrival time. Thanks!   Dolores Frame, MD  Marlowe Shores, LPN Should be ok with me Thanks  Elsie Amis, RN  Marlowe Shores, LPN Okay. Just let me know! Thank you!   ----- Message ----- From: Marlowe Shores, LPN Sent: 04/19/7828   9:32 AM EDT To: Elsie Amis, RN Subject: RE: change date.                              That was my fault. I did not put on his instructions to stop the Dapaglifloxin. I have sent the message to Dr.Castaneda to see if this is ok with him. ----- Message ----- From: Elsie Amis, RN Sent: 02/18/2023   8:57 AM EDT To: Mindy S Estudillo, CMA; Marlowe Shores, LPN Subject: change date.                                  Good morning ladies! Scott [name] is here for preop and did not stop his Dapaglifloxin., he took it this morning. Dr Alva Garnet said he will do patients procedure on Wednesday in Endo room 1, while Dr Levon Hedger is here. You guys just need to make sure this is okay with Dr Levon Hedger and get him rescheduled for that day if this is okay. He has everything he needs here, once he ir rescheduled if you let me know , we can call him with new arrival time. Thanks!  [name], [name], LPN Hey,  Question.  PAT is telling me that this patient can not be done tomorrow and that you said he can go Wednesday at 2:45.  I don't have any notes about  moving this patient and he can not go on Wednesday at 2:45 unless you have talked with [name] and gotten an exception.  Can you help me understand what is going on with this patient, please.  Thanks,   Pt has been rescheduled to 03/12/23 at 9:00am. Pt has prep. Instructions sent to pt via my chart. Message sent to Endo. Pt originally scheduled for 02/19/23

## 2023-02-18 NOTE — Pre-Procedure Instructions (Signed)
Patient did not stop his Dapaglifolzin. He took it today, day before procedure. Office messaged to let them know that Dr Alva Garnet says he will do this procedure on Wednesday ion endo room 1, if this is okay with Dr Amil Amen and it fits into his schedule.

## 2023-03-08 ENCOUNTER — Telehealth (INDEPENDENT_AMBULATORY_CARE_PROVIDER_SITE_OTHER): Payer: Self-pay | Admitting: Gastroenterology

## 2023-03-08 ENCOUNTER — Other Ambulatory Visit: Payer: Self-pay

## 2023-03-08 ENCOUNTER — Encounter (HOSPITAL_COMMUNITY)
Admission: RE | Admit: 2023-03-08 | Discharge: 2023-03-08 | Disposition: A | Discharge status: Home / Self Care | Payer: 59 | Source: Ambulatory Visit | Attending: Gastroenterology | Admitting: Gastroenterology

## 2023-03-08 ENCOUNTER — Encounter (HOSPITAL_COMMUNITY): Payer: Self-pay

## 2023-03-08 ENCOUNTER — Encounter (INDEPENDENT_AMBULATORY_CARE_PROVIDER_SITE_OTHER): Payer: Self-pay | Admitting: Gastroenterology

## 2023-03-08 NOTE — Pre-Procedure Instructions (Signed)
Attempted pre-op phone call. Left VM for him to call us back. 

## 2023-03-08 NOTE — Telephone Encounter (Signed)
Yes, patient needs a repeat potassium at the time of preop. Please let the preop staff to order this Thanks

## 2023-03-08 NOTE — Pre-Procedure Instructions (Signed)
Preop phone call done. Patient states that PCP rechecked potassium on 02/20/2023 and his result was 3.7. Patient has copy of results with him and will bring them on day of procedure so we can place them in chart.

## 2023-03-08 NOTE — Telephone Encounter (Signed)
Pt TCS reschedule due to low potassium. Pt down for telephone call for pre op this morning. Pt went to hospital this morning;pt states he remembers the nurse telling him that his potassium would need to be repeated. Will pt need labs repeated before TCS on 03/12/23? Please advise. Thank you  Pt call back number:[phone]

## 2023-03-12 ENCOUNTER — Ambulatory Visit (HOSPITAL_COMMUNITY)
Admission: RE | Admit: 2023-03-12 | Discharge: 2023-03-12 | Disposition: A | Discharge status: Home / Self Care | Payer: 59 | Attending: Gastroenterology | Admitting: Gastroenterology

## 2023-03-12 ENCOUNTER — Other Ambulatory Visit: Payer: Self-pay

## 2023-03-12 ENCOUNTER — Encounter (HOSPITAL_COMMUNITY)
Admission: RE | Disposition: A | Discharge status: Home / Self Care | Payer: Self-pay | Source: Home / Self Care | Attending: Gastroenterology

## 2023-03-12 ENCOUNTER — Ambulatory Visit (HOSPITAL_BASED_OUTPATIENT_CLINIC_OR_DEPARTMENT_OTHER): Payer: 59 | Admitting: Anesthesiology

## 2023-03-12 ENCOUNTER — Encounter (HOSPITAL_COMMUNITY): Payer: Self-pay | Admitting: Gastroenterology

## 2023-03-12 ENCOUNTER — Encounter (INDEPENDENT_AMBULATORY_CARE_PROVIDER_SITE_OTHER): Payer: Self-pay | Admitting: *Deleted

## 2023-03-12 ENCOUNTER — Ambulatory Visit (HOSPITAL_COMMUNITY): Payer: 59 | Admitting: Anesthesiology

## 2023-03-12 DIAGNOSIS — K5730 Diverticulosis of large intestine without perforation or abscess without bleeding: Secondary | ICD-10-CM | POA: Diagnosis not present

## 2023-03-12 DIAGNOSIS — K625 Hemorrhage of anus and rectum: Secondary | ICD-10-CM

## 2023-03-12 DIAGNOSIS — K648 Other hemorrhoids: Secondary | ICD-10-CM | POA: Insufficient documentation

## 2023-03-12 DIAGNOSIS — G4733 Obstructive sleep apnea (adult) (pediatric): Secondary | ICD-10-CM | POA: Diagnosis not present

## 2023-03-12 DIAGNOSIS — I10 Essential (primary) hypertension: Secondary | ICD-10-CM

## 2023-03-12 DIAGNOSIS — K635 Polyp of colon: Secondary | ICD-10-CM | POA: Insufficient documentation

## 2023-03-12 HISTORY — PX: BIOPSY: SHX5522

## 2023-03-12 HISTORY — PX: COLONOSCOPY WITH PROPOFOL: SHX5780

## 2023-03-12 LAB — HM COLONOSCOPY

## 2023-03-12 LAB — GLUCOSE, CAPILLARY: Glucose-Capillary: 104 mg/dL — ABNORMAL HIGH (ref 70–99)

## 2023-03-12 SURGERY — COLONOSCOPY WITH PROPOFOL
Anesthesia: General

## 2023-03-12 MED ORDER — LACTATED RINGERS IV SOLN: INTRAVENOUS | Status: DC

## 2023-03-12 MED ORDER — PROPOFOL 500 MG/50ML IV EMUL
INTRAVENOUS | Status: DC | PRN
  Administered 2023-03-12: 150 ug/kg/min via INTRAVENOUS

## 2023-03-12 MED ORDER — PROPOFOL 10 MG/ML IV BOLUS
INTRAVENOUS | Status: DC | PRN
Start: 2023-03-12 — End: 2023-03-12
  Administered 2023-03-12: 100 mg via INTRAVENOUS

## 2023-03-12 MED ORDER — LIDOCAINE HCL (CARDIAC) PF 100 MG/5ML IV SOSY
PREFILLED_SYRINGE | INTRAVENOUS | Status: DC | PRN
  Administered 2023-03-12: 100 mg via INTRAVENOUS

## 2023-03-12 NOTE — Anesthesia Postprocedure Evaluation (Signed)
Anesthesia Post Note  Patient: Scott [name]  Procedure(s) Performed: COLONOSCOPY WITH PROPOFOL BIOPSY  Patient location during evaluation: Phase II Anesthesia Type: General Level of consciousness: awake and alert and oriented Pain management: pain level controlled Vital Signs Assessment: post-procedure vital signs reviewed and stable Respiratory status: spontaneous breathing, nonlabored ventilation and respiratory function stable Cardiovascular status: blood pressure returned to baseline and stable Postop Assessment: no apparent nausea or vomiting Anesthetic complications: no  No notable events documented.   Last Vitals:  Vitals:   03/12/23 0927 03/12/23 0938  BP: 91/75 113/77  Pulse: 84   Resp: 18   Temp: 36.6 C   SpO2: 100%     Last Pain:  Vitals:   03/12/23 0927  TempSrc: Oral  PainSc: 0-No pain                  C 

## 2023-03-12 NOTE — Transfer of Care (Addendum)
Immediate Anesthesia Transfer of Care Note  Patient: Scott [name]  Procedure(s) Performed: COLONOSCOPY WITH PROPOFOL BIOPSY  Patient Location: Short Stay  Anesthesia Type:General  Level of Consciousness: awake and patient cooperative  Airway & Oxygen Therapy: Patient Spontanous Breathing and Patient connected to face mask oxygen  Post-op Assessment: Report given to RN and Post -op Vital signs reviewed and stable  Post vital signs: Reviewed and stable  Last Vitals:  Vitals Value Taken Time  BP 113/77 03/12/23   0938  Temp 36.6 03/12/23   0927  Pulse 84 03/12/23   0927  Resp 18 03/12/23   0927  SpO2 100% 03/12/23   0927    Last Pain:  Vitals:   03/12/23 0927  TempSrc: Oral  PainSc: 0-No pain      Patients Stated Pain Goal: 6 (03/12/23 0751)  Complications: No notable events documented.

## 2023-03-12 NOTE — Anesthesia Preprocedure Evaluation (Signed)
Anesthesia Evaluation  Patient identified by MRN, date of birth, ID band Patient awake    Reviewed: Allergy & Precautions, H&P , NPO status , Patient's Chart, lab work & pertinent test results, reviewed documented beta blocker date and time   Airway Mallampati: II  TM Distance: >3 FB Neck ROM: Full    Dental no notable dental hx.    Pulmonary sleep apnea (severe OSA) and Continuous Positive Airway Pressure Ventilation    Pulmonary exam normal breath sounds clear to auscultation       Cardiovascular Exercise Tolerance: Good hypertension, Pt. on medications and Pt. on home beta blockers Normal cardiovascular exam Rhythm:Regular Rate:Normal     Neuro/Psych  PSYCHIATRIC DISORDERS Anxiety     negative neurological ROS     GI/Hepatic Neg liver ROS,GERD  Medicated and Controlled,,  Endo/Other  diabetes, Well Controlled, Type 2, Oral Hypoglycemic Agents    Renal/GU negative Renal ROS  negative genitourinary   Musculoskeletal  (+) Arthritis , Osteoarthritis,    Abdominal   Peds negative pediatric ROS (+)  Hematology  (+) Blood dyscrasia, anemia   Anesthesia Other Findings   Reproductive/Obstetrics negative OB ROS                              Anesthesia Physical Anesthesia Plan  ASA: 3  Anesthesia Plan: General   Post-op Pain Management: Minimal or no pain anticipated   Induction: Intravenous  PONV Risk Score and Plan: 1 and Propofol infusion  Airway Management Planned: Nasal Cannula and Natural Airway  Additional Equipment:   Intra-op Plan:   Post-operative Plan:   Informed Consent: I have reviewed the patients History and Physical, chart, labs and discussed the procedure including the risks, benefits and alternatives for the proposed anesthesia with the patient or authorized representative who has indicated his/her understanding and acceptance.     Dental advisory given  Plan  Discussed with: CRNA and Surgeon  Anesthesia Plan Comments:          Anesthesia Quick Evaluation

## 2023-03-12 NOTE — Op Note (Signed)
[facility] Patient Name: Scott [name] Procedure Date: 03/12/2023 8:42 AM MRN: [id number] Date of Birth: 30-Mar-1966 Attending MD: [name] , , [id number] CSN: [id number] Age: 57 Admit Type: Outpatient Procedure:                Colonoscopy Indications:              Rectal bleeding Providers:                [name], Edrick Kins, RN, Elinor Parkinson, Dyann Ruddle Referring MD:              Medicines:                Monitored Anesthesia Care Complications:            No immediate complications. Estimated Blood Loss:     Estimated blood loss: none. Procedure:                Pre-Anesthesia Assessment:                           - Prior to the procedure, a History and Physical                            was performed, and patient medications, allergies                            and sensitivities were reviewed. The patient's                            tolerance of previous anesthesia was reviewed.                           - The risks and benefits of the procedure and the                            sedation options and risks were discussed with the                            patient. All questions were answered and informed                            consent was obtained.                           - ASA Grade Assessment: II - A patient with mild                            systemic disease.                           After obtaining informed consent, the colonoscope                            was passed under direct vision. Throughout the  procedure, the patient's blood pressure, pulse, and                            oxygen saturations were monitored continuously. The                            [id number]-HQ190L ([id number]) scope was introduced through                            the anus and advanced to the the terminal ileum.                            The colonoscopy was performed without difficulty.                             The patient tolerated the procedure well. The                            quality of the bowel preparation was adequate. Scope In: 9:05:53 AM Scope Out: 9:24:38 AM Scope Withdrawal Time: 0 hours 14 minutes 13 seconds  Total Procedure Duration: 0 hours 18 minutes 45 seconds  Findings:      Hemorrhoids were found on perianal exam.      The terminal ileum appeared normal.      A few small-mouthed diverticula were found in the sigmoid colon.       Biopsies for histology were taken from the normal colon with a cold       forceps from the right colon and left colon for evaluation of       microscopic colitis.      Non-bleeding internal hemorrhoids were found during retroflexion. The       hemorrhoids were large and one was slightly ulcerated. Impression:               - Hemorrhoids found on perianal exam.                           - The examined portion of the ileum was normal.                           - Diverticulosis in the sigmoid colon. Biopsied.                           - Non-bleeding internal hemorrhoids. Moderate Sedation:      Per Anesthesia Care Recommendation:           - Discharge patient to home (ambulatory).                           - Resume previous diet.                           - Await pathology results.                           - Repeat colonoscopy in 10 years for screening  purposes.                           - Will schedule hemorrhoidal banding in clinic. Procedure Code(s):        --- Professional ---                           [phone], Colonoscopy, flexible; with biopsy, single                            or multiple Diagnosis Code(s):        --- Professional ---                           K64.8, Other hemorrhoids                           K62.5, Hemorrhage of anus and rectum                           K57.30, Diverticulosis of large intestine without                            perforation or abscess without bleeding CPT copyright  2022 American Medical Association. All rights reserved. The codes documented in this report are preliminary and upon coder review may  be revised to meet current compliance requirements. [name], MD [name],  03/12/2023 9:30:48 AM This report has been signed electronically. Number of Addenda: 0

## 2023-03-12 NOTE — H&P (Signed)
Scott [name] is an 57 y.o. male.   Chief Complaint: Rectal bleeding HPI: Scott [name] is a 57 y.o. male from [place] with PMH GERD, IBS, anxiety, diabetes, hypertension, hyperlipidemia, OSA, who presents for evaluation of abdominal pain, diarrhea and rectal bleeding.   Patient reported he has presented recurrent blood in his stool, which has presence of large clots.  He denies any pain, nausea, vomiting, fever or chills.  Has presented chronic diarrhea.  Past Medical History:  Diagnosis Date   Anemia    Anxiety    Arthritis    Back pain    pinched nerve sciatica   Diabetes mellitus without complication (HCC)    type  2   Elevated cholesterol with elevated triglycerides    Elevated liver enzymes 2014   normal now   GERD (gastroesophageal reflux disease)    Hypertension    Hypertension    Sleep apnea    uses cpap,setting of 14    Past Surgical History:  Procedure Laterality Date   CATARACT EXTRACTION Left    COLONOSCOPY N/A 09/29/2012   Procedure: COLONOSCOPY;  Surgeon: Charna Elizabeth, MD;  Location: [facility];  Service: Endoscopy;  Laterality: N/A;   COLONOSCOPY WITH PROPOFOL N/A 06/22/2016   Procedure: COLONOSCOPY WITH PROPOFOL;  Surgeon: Jeani Hawking, MD;  Location: [facility];  Service: Endoscopy;  Laterality: N/A;   COLONOSCOPY WITH PROPOFOL N/A 05/20/2020   Procedure: COLONOSCOPY WITH PROPOFOL;  Surgeon: Jeani Hawking, MD;  Location: [facility];  Service: Endoscopy;  Laterality: N/A;   ENTEROSCOPY N/A 05/20/2020   Procedure: ENTEROSCOPY;  Surgeon: Jeani Hawking, MD;  Location: [facility];  Service: Endoscopy;  Laterality: N/A;   ESOPHAGOGASTRODUODENOSCOPY N/A 09/29/2012   Procedure: ESOPHAGOGASTRODUODENOSCOPY (EGD);  Surgeon: Charna Elizabeth, MD;  Location: [facility];  Service: Endoscopy;  Laterality: N/A;   ESOPHAGOGASTRODUODENOSCOPY (EGD) WITH PROPOFOL N/A 06/22/2016   Procedure: ESOPHAGOGASTRODUODENOSCOPY (EGD) WITH PROPOFOL;  Surgeon: Jeani Hawking,  MD;  Location: [facility];  Service: Endoscopy;  Laterality: N/A;   FRACTURE SURGERY     right wrist with screws   HERNIA REPAIR  2008   inguinal   KNEE ARTHROSCOPY Left 06/24/2020   Procedure: ARTHROSCOPY KNEE WITH PARTIAL MEDIAL MENISCECTOMY, CHONDROPLASTY,  OPEN CYSTECTOMY;  Surgeon: Jodi Geralds, MD;  Location: [facility];  Service: Orthopedics;  Laterality: Left;    Family History  Problem Relation Age of Onset   Diabetes Mother    Hypertension Mother    Hyperlipidemia Mother    Cancer Father    Thyroid disease Sister    Social History:  reports that he has never smoked. He has never used smokeless tobacco. He reports current alcohol use. He reports that he does not currently use drugs after having used the following drugs: Marijuana.  Allergies:  Allergies  Allergen Reactions   Lactose Intolerance (Gi)    Trazodone Hcl     Other reaction(s): rapid heartbeat   Trulicity [Dulaglutide] Other (See Comments)    Weakness - severe    Medications Prior to Admission  Medication Sig Dispense Refill   atenolol-chlorthalidone (TENORETIC) 100-25 MG per tablet Take 1 tablet by mouth daily.     atorvastatin (LIPITOR) 20 MG tablet Take 20 mg by mouth at bedtime.     BUDESONIDE NA Place 1 spray into both nostrils daily.     busPIRone (BUSPAR) 10 MG tablet Take 10 mg by mouth 3 (three) times daily.  0   Dapagliflozin Pro-metFORMIN ER 05-999 MG TB24 Take 1 tablet by mouth daily at 6 (six) AM.  dorzolamide-timolol (COSOPT) 2-0.5 % ophthalmic solution Place 1 drop into the left eye 2 (two) times daily.     Eszopiclone 3 MG TABS Take 3 mg by mouth at bedtime. Take immediately before bedtime     ferrous sulfate 325 (65 FE) MG tablet Take 325 mg by mouth daily.     gabapentin (NEURONTIN) 300 MG capsule Take 300 mg by mouth at bedtime.     glucose blood test strip Use to check blood sugar once a day. 100 each 12   hydrocortisone (ANUSOL-HC) 25 MG suppository Place 1 suppository (25 mg total)  rectally 2 (two) times daily. 24 suppository 1   hydrOXYzine (ATARAX/VISTARIL) 25 MG tablet Take 25 mg by mouth at bedtime.  0   levocetirizine (XYZAL) 5 MG tablet Take 5 mg by mouth every evening.     lisinopril (PRINIVIL,ZESTRIL) 2.5 MG tablet Take 5 mg by mouth at bedtime.     LORazepam (ATIVAN) 0.5 MG tablet Take 0.5 mg by mouth 3 (three) times daily as needed for anxiety.     Melatonin 10 MG TABS Take 10 mg by mouth at bedtime.     Multiple Vitamin (MULTIVITAMIN WITH MINERALS) TABS tablet Take 1 tablet by mouth daily. Multivitamin for Adults 50+ (Iron Free)  gummy     Omega-3 Fatty Acids (FISH OIL) 1000 MG CAPS Take 2,000 mg by mouth at bedtime.     omeprazole (PRILOSEC) 40 MG capsule Take 40 mg by mouth 2 (two) times daily.     Semaglutide 14 MG TABS Take 14 mg by mouth daily.     tiZANidine (ZANAFLEX) 4 MG tablet Take 4 mg by mouth at bedtime.     benzocaine (AMERICAINE) 20 % rectal ointment Place rectally every 3 (three) hours as needed for pain. (Patient not taking: Reported on 02/14/2023) 56 g 0   docusate sodium (COLACE) 100 MG capsule Take 1 capsule (100 mg total) by mouth every 12 (twelve) hours. (Patient not taking: Reported on 12/24/2022) 60 capsule 0   MICROLET LANCETS MISC Use to check blood sugar. 100 each 2   potassium chloride SA (KLOR-CON M) 20 MEQ tablet Take 2 tablets (40 mEq total) by mouth daily for 3 days. 6 tablet 0    Results for orders placed or performed during the hospital encounter of 03/12/23 (from the past 48 hour(s))  Glucose, capillary     Status: Abnormal   Collection Time: 03/12/23  7:32 AM  Result Value Ref Range   Glucose-Capillary 104 (H) 70 - 99 mg/dL    Comment: Glucose reference range applies only to samples taken after fasting for at least 8 hours.   No results found.  Review of Systems  Gastrointestinal:  Positive for anal bleeding.  All other systems reviewed and are negative.   Blood pressure 106/77, pulse 73, temperature 98.6 F (37 C),  temperature source Oral, resp. rate 18, height 6\' 1"  (1.854 m), weight 132.9 kg, SpO2 100%. Physical Exam  GENERAL: The patient is AO x3, in no acute distress. HEENT: Head is normocephalic and atraumatic. EOMI are intact. Mouth is well hydrated and without lesions. NECK: Supple. No masses LUNGS: Clear to auscultation. No presence of rhonchi/wheezing/rales. Adequate chest expansion HEART: RRR, normal s1 and s2. ABDOMEN: Soft, nontender, no guarding, no peritoneal signs, and nondistended. BS +. No masses. EXTREMITIES: Without any cyanosis, clubbing, rash, lesions or edema. NEUROLOGIC: AOx3, no focal motor deficit. SKIN: no jaundice, no rashes  Assessment/Plan Scott [name] is a 57 y.o. male from [place] with  PMH GERD, IBS, anxiety, diabetes, hypertension, hyperlipidemia, OSA, who presents for evaluation of abdominal pain, diarrhea and rectal bleeding.  Will proceed with colonoscopy. Dolores Frame, MD 03/12/2023, 8:58 AM

## 2023-03-12 NOTE — Discharge Instructions (Addendum)
You are being discharged to home.  Resume your previous diet.  We are waiting for your pathology results.  Your physician has recommended a repeat colonoscopy in 10 years for screening purposes.  Will schedule hemorrhoidal banding in clinic.

## 2023-03-13 ENCOUNTER — Encounter (INDEPENDENT_AMBULATORY_CARE_PROVIDER_SITE_OTHER): Payer: Self-pay | Admitting: *Deleted

## 2023-03-13 ENCOUNTER — Telehealth (INDEPENDENT_AMBULATORY_CARE_PROVIDER_SITE_OTHER): Payer: Self-pay | Admitting: *Deleted

## 2023-03-13 NOTE — Telephone Encounter (Signed)
Called pt to see how he was and he said he was good today. No more diarrhea with blood since last night.

## 2023-03-13 NOTE — Telephone Encounter (Signed)
Pt's wife called yesterday and message was forwarded to me today at 7:41. She states on vm left yesterday that pt had tcs today and since being home he has had 4 episodes of diarrhea with blood in stool. Wanted to know if he could take imodium.  I called pt's wife ( on dpr ) this morning and let her know I just received her message and asked how pt was doing. She states he was not having any pain but did have a total of 5 episodes of diarrhea with blood yesterday after colonoscopy. He did sleep through the night and no more diarrhea after 9 am. She was at work this morning and pt asleep when she left. She said he would probably wake up around 9:30 if I wanted to call him to see how he is doing this morning. States she talked to pcp and was told not to take imodium so he did not take any. He was raw from all the diarrhea and used Desitin.  Wife states she was told he had ulceratvie internal hemhorroid that was probably the cause of his bleeding but states report said hemhorroid was not bleeding.   Wife Marchelle Folks - [phone] Pt's # - [phone]

## 2023-03-13 NOTE — Telephone Encounter (Signed)
I called the patient but he did not answer my call.  I spoke with the wife who reported he was feeling much better today and did not have any more bleeding.

## 2023-03-15 ENCOUNTER — Encounter (HOSPITAL_COMMUNITY): Payer: Self-pay | Admitting: Gastroenterology

## 2023-03-26 ENCOUNTER — Encounter: Payer: Self-pay | Admitting: Gastroenterology

## 2023-03-26 ENCOUNTER — Ambulatory Visit: Payer: 59 | Admitting: Gastroenterology

## 2023-03-26 VITALS — BP 134/87 | HR 86 | Temp 96.6°F | Ht 73.0 in | Wt 286.5 lb

## 2023-03-26 DIAGNOSIS — K625 Hemorrhage of anus and rectum: Secondary | ICD-10-CM

## 2023-03-26 DIAGNOSIS — K641 Second degree hemorrhoids: Secondary | ICD-10-CM

## 2023-03-26 NOTE — Patient Instructions (Signed)
Continue to avoid straining. Limit toilet time to 2-3 minutes at the most.   Avoid constipation. Take 2 tablespoons of natural wheat bran, natural oat bran, flax, Benefiber or any over the counter fiber supplement and increase your water intake to 7-8 glasses daily.  Occasionally, you may have more bleeding than usual after the banding procedure. This is often from the untreated hemorrhoids rather than the treated one. Don't be concerned if there is a tablespoon or so of blood. If there is more blood than this, lie flat with your bottom higher than your head and apply an ice pack to the area. If the bleeding does not stop within a half an hour or if you feel faint, have severe pain, chills, fever or difficulty passing urine (very rare) or other problems, you should call us at [phone] or report to the nearest emergency room. Please call me with any concerns!  The procedure you have had should have been relatively painless since the banding of the area involved does not have nerve endings and there is no pain sensation. The rubber band cuts off the blood supply to the hemorrhoid and the band may fall off as soon as 48 hours after the banding (the band may occasionally be seen in the toilet bowl following a bowel movement). You may notice a temporary feeling of fullness in the rectum which should respond adequately to plain Tylenol or Motrin.  I will see you back in 2-3 weeks for additional banding.   Brooke Bonito, MSN, FNP-BC, AGACNP-BC [facility]

## 2023-03-26 NOTE — Progress Notes (Signed)
   [facility] Banding Procedure Note:   Scott [name] is a 57 y.o. male presenting today for consideration of hemorrhoid banding. Last colonoscopy 03/12/23 with hemorrhoids on perianal exam, normal ileum, sigmoid diverticulosis s/p biopsy, large nonbleeding internal hemorrhoids, reporting 1 that was slightly ulcerated.  Random biopsies negative for microscopic colitis  Latex Allergy: No  History: Patient reports bleeding, pressure, itching, and pain in regards to his hemorrhoids.  He denies any constipation.  Does report he suffers from diarrhea as well as some incomplete emptying.  In about 10 minutes on average on the commode.  Reports hemorrhoid tissue with 5 pounds on.  No active Saturday.  The patient presents with symptomatic grade 2 hemorrhoids, unresponsive to maximal medical therapy, requesting rubber band ligation of his/her hemorrhoidal disease. All risks, benefits, and alternative forms of therapy were described and informed consent was obtained.  In the left lateral decubitus position (if anoscopy is performed) anoscopic examination revealed grade 2 hemorrhoids in the right posterior position, un able to view the right anterior or left lateral well on anoscopy given patient discomfort. DRE with good rectal tone and palpable large hemorrhoids noted to all 3 hemorrhoid columns.   The decision was made to band the right posterior internal hemorrhoid, and the [facility] O'Regan System was used to perform band ligation without complication. Digital anorectal examination was then performed to assure proper positioning of the band, and to adjust the banded tissue as required. The patient was discharged home without pain or other issues. Dietary and behavioral recommendations were given and (if necessary prescriptions were given), along with follow-up instructions. The patient will return in 2-3 weeks for additional banding as required.  No complications were encountered and the patient tolerated the  procedure well.    Brooke Bonito, MSN, FNP-BC, AGACNP-BC [facility] \

## 2023-03-28 ENCOUNTER — Telehealth (INDEPENDENT_AMBULATORY_CARE_PROVIDER_SITE_OTHER): Payer: Self-pay | Admitting: *Deleted

## 2023-03-28 NOTE — Telephone Encounter (Signed)
Pt had hemorrhoid banding on 03/26/23 and his Pt's wife left vm yesterday 8/21 at 4pm stating he just used the bathroom and wiped and saw the band and having a lot of bleeding.   Left his cell number  [phone] And her cell [phone]

## 2023-03-28 NOTE — Telephone Encounter (Signed)
Called wife this morning since no answer on pt phone and pt's wife states they sent mychart message to courtney yesterday and she responded and they are communicating through [place] currently.

## 2023-04-15 ENCOUNTER — Encounter: Payer: Self-pay | Admitting: Gastroenterology

## 2023-04-15 ENCOUNTER — Encounter: Payer: 59 | Admitting: Gastroenterology

## 2023-04-15 ENCOUNTER — Ambulatory Visit (INDEPENDENT_AMBULATORY_CARE_PROVIDER_SITE_OTHER): Payer: 59 | Admitting: Gastroenterology

## 2023-04-15 VITALS — BP 131/78 | HR 76 | Temp 97.7°F | Ht 73.0 in | Wt 291.8 lb

## 2023-04-15 DIAGNOSIS — K641 Second degree hemorrhoids: Secondary | ICD-10-CM | POA: Diagnosis not present

## 2023-04-15 NOTE — Patient Instructions (Signed)
Continue to avoid straining. Limit toilet time to 2-3 minutes at the most.   Avoid constipation. Take 2 tablespoons of natural wheat bran, natural oat bran, flax, Benefiber or any over the counter fiber supplement and increase your water intake to 7-8 glasses daily.  Occasionally, you may have more bleeding than usual after the banding procedure. This is often from the untreated hemorrhoids rather than the treated one. Don't be concerned if there is a tablespoon or so of blood. If there is more blood than this, lie flat with your bottom higher than your head and apply an ice pack to the area. If the bleeding does not stop within a half an hour or if you feel faint, have severe pain, chills, fever or difficulty passing urine (very rare) or other problems, you should call us at [phone] or report to the nearest emergency room. Please call me with any concerns!  The procedure you have had should have been relatively painless since the banding of the area involved does not have nerve endings and there is no pain sensation. The rubber band cuts off the blood supply to the hemorrhoid and the band may fall off as soon as 48 hours after the banding (the band may occasionally be seen in the toilet bowl following a bowel movement). You may notice a temporary feeling of fullness in the rectum which should respond adequately to plain Tylenol or Motrin.  I will see you back in 2-3 weeks for follow-up and additional banding.  Brooke Bonito, MSN, FNP-BC, AGACNP-BC [facility]

## 2023-04-15 NOTE — Progress Notes (Signed)
   [facility] Banding Procedure Note:   Scott [name] is a 57 y.o. male presenting today for consideration of hemorrhoid banding. Last colonoscopy  03/12/23 with hemorrhoids on perianal exam, normal ileum, sigmoid diverticulosis s/p biopsy, large nonbleeding internal hemorrhoids, reporting 1 that was slightly ulcerated.  Random biopsies negative for microscopic colitis.  Latex Allergy: no.   Interval History: Continues to have bleeding, pressure, itching, and pain in regards to his hemorrhoids.  External hemorrhoid has been bothering him today.  Usually has internal hemorrhoids prolapse but, again on their own.  States after his last banding he had some blood with wiping but also found banding on his toilet tissue.  The patient presents with symptomatic grade 2 hemorrhoids, unresponsive to maximal medical therapy, requesting rubber band ligation of his/her hemorrhoidal disease. All risks, benefits, and alternative forms of therapy were described and informed consent was obtained.  Discussed proceeding with additional hemorrhoid: Banding and then will circle back to right posterior given the band falling off within 24 hours as he likely did not have any good results from this.  The decision was made to band the right anterior internal hemorrhoid, and the [facility] System was used to perform band ligation without complication. Digital anorectal examination was then performed to assure proper positioning of the band, and to adjust the banded tissue as required. The patient was discharged home without pain or other issues. Dietary and behavioral recommendations were given and (if necessary prescriptions were given), along with follow-up instructions. The patient will return in 2-3 weeks  for followup and possible additional banding as required.  No complications were encountered and the patient tolerated the procedure well.    Brooke Bonito, MSN, FNP-BC, AGACNP-BC [facility]

## 2023-04-22 ENCOUNTER — Ambulatory Visit (INDEPENDENT_AMBULATORY_CARE_PROVIDER_SITE_OTHER): Payer: 59 | Admitting: Gastroenterology

## 2023-04-22 ENCOUNTER — Encounter (INDEPENDENT_AMBULATORY_CARE_PROVIDER_SITE_OTHER): Payer: Self-pay | Admitting: Gastroenterology

## 2023-04-22 VITALS — BP 124/79 | HR 78 | Temp 98.3°F | Ht 73.0 in | Wt 287.3 lb

## 2023-04-22 DIAGNOSIS — K580 Irritable bowel syndrome with diarrhea: Secondary | ICD-10-CM

## 2023-04-22 DIAGNOSIS — K649 Unspecified hemorrhoids: Secondary | ICD-10-CM | POA: Diagnosis not present

## 2023-04-22 DIAGNOSIS — K529 Noninfective gastroenteritis and colitis, unspecified: Secondary | ICD-10-CM

## 2023-04-22 MED ORDER — COLESTIPOL HCL 1 G PO TABS
1.0000 g | ORAL_TABLET | Freq: Every day | ORAL | 1 refills | Status: DC
Start: 2023-04-22 — End: 2023-08-12

## 2023-04-22 NOTE — Patient Instructions (Signed)
Start colestipol 1 g qday Can stop Imodium once you start colestipol. If still with diarrhea can increase colestipol to 2 g per day. Proceed with more banding sessions as scheduled

## 2023-04-22 NOTE — Progress Notes (Unsigned)
Scott [name], M.D. Gastroenterology & Hepatology [facility] [address] [place], [place] [address]  Primary Care Physician: Scott Retort, MD [address] [place] [address]  I will communicate my assessment and recommendations to the referring MD via EMR.  Problems: Ulcerated internal hemorrhoids IBS-D  History of Present Illness: Scott [name] is a 56 y.o. male from [place] with PMH GERD, IBS, anxiety, diabetes, hypertension, hyperlipidemia, OSA,  who presents for follow up of rectal bleeding and chronic diarrhea.  The patient was last seen on 12/24/2022. At that time, the patient was advised to continue Imodium as needed and to avoid trigger foods.  Underwent colonoscopy for evaluation of rectal bleeding.  Patient had COVID after his colonoscopy. He received oral COVID medications, which helped him improve her symptoms.  Patient underwent banding of hemorrhoid on 04/15/2023 with Scott [name]. She had a previous banding on 03/26/23 but that band fell off. He states he has not presented any rectal bleeding since his last banding. He is scheduled to have two more banding sessions.  He reports that he has felt some intermittent diarrhea and constipation. He states he has been eating more different type of food recently which may have led to change in his BM frequency. Overall feels he is doing better. Takes Imodium every other day. May have up to 4 Bms per day.  The patient denies having any nausea, vomiting, fever, chills, hematochezia, melena, hematemesis, abdominal distention, abdominal pain,  jaundice, pruritus or weight loss.  Last EGD/push enteroscopy: 05/20/2020 performed by Scott [name], normal esophagus stomach, duodenum and jejunum.  Last Colonoscopy:.  03/12/2023 presence of hemorrhoids in the external and internal area, normal terminal ileum and diverticulosis.  Random colonic biopsies were negative  for microscopic colitis.  Past Medical History: Past Medical History:  Diagnosis Date   Anemia    Anxiety    Arthritis    Back pain    pinched nerve sciatica   Diabetes mellitus without complication (HCC)    type  2   Elevated cholesterol with elevated triglycerides    Elevated liver enzymes 2014   normal now   GERD (gastroesophageal reflux disease)    Hypertension    Hypertension    Sleep apnea    uses cpap,setting of 14    Past Surgical History: Past Surgical History:  Procedure Laterality Date   BIOPSY  03/12/2023   Procedure: BIOPSY;  Surgeon: Scott Frame, MD;  [facility];  Service: Gastroenterology;;   CATARACT EXTRACTION Left    COLONOSCOPY N/A 09/29/2012   Procedure: COLONOSCOPY;  Surgeon: Scott Elizabeth, MD;  Location: [facility];  Service: Endoscopy;  Laterality: N/A;   COLONOSCOPY WITH PROPOFOL N/A 06/22/2016   Procedure: COLONOSCOPY WITH PROPOFOL;  Surgeon: Scott Hawking, MD;  Location: [facility];  Service: Endoscopy;  Laterality: N/A;   COLONOSCOPY WITH PROPOFOL N/A 05/20/2020   Procedure: COLONOSCOPY WITH PROPOFOL;  Surgeon: Scott Hawking, MD;  Location: [facility];  Service: Endoscopy;  Laterality: N/A;   COLONOSCOPY WITH PROPOFOL N/A 03/12/2023   Procedure: COLONOSCOPY WITH PROPOFOL;  Surgeon: Scott Frame, MD;  [facility]: [facility];  Service: Gastroenterology;  Laterality: N/A;  11:00am;asa 3, can be done in Room 1 if rescheduled soon 7/15 cy   ENTEROSCOPY N/A 05/20/2020   Procedure: ENTEROSCOPY;  Surgeon: Scott Hawking, MD;  [facility];  Service: Endoscopy;  Laterality: N/A;   ESOPHAGOGASTRODUODENOSCOPY N/A 09/29/2012   Procedure: ESOPHAGOGASTRODUODENOSCOPY (EGD);  Surgeon: Scott Elizabeth,  MD;  [facility];  Service: Endoscopy;  Laterality: N/A;   ESOPHAGOGASTRODUODENOSCOPY (EGD) WITH PROPOFOL N/A 06/22/2016   Procedure: ESOPHAGOGASTRODUODENOSCOPY (EGD) WITH PROPOFOL;  Surgeon: Scott Hawking, MD;   [facility];  Service: Endoscopy;  Laterality: N/A;   FRACTURE SURGERY     right wrist with screws   HERNIA REPAIR  2008   inguinal   KNEE ARTHROSCOPY Left 06/24/2020   Procedure: ARTHROSCOPY KNEE WITH PARTIAL MEDIAL MENISCECTOMY, CHONDROPLASTY,  OPEN CYSTECTOMY;  Surgeon: Scott Geralds, MD;  Location: [facility];  Service: Orthopedics;  Laterality: Left;    Family History: Family History  Problem Relation Age of Onset   Diabetes Mother    Hypertension Mother    Hyperlipidemia Mother    Cancer Father    Thyroid disease Sister     Social History: Social History   Tobacco Use  Smoking Status Never  Smokeless Tobacco Never   Social History   Substance and Sexual Activity  Alcohol Use Yes   Comment: rarely   Social History   Substance and Sexual Activity  Drug Use Not Currently   Types: Marijuana   Comment: rare marijuana use only last used 3 months ago    Allergies: Allergies  Allergen Reactions   Lactose Intolerance (Gi)    Trazodone Hcl     Other reaction(s): rapid heartbeat   Trulicity [Dulaglutide] Other (See Comments)    Weakness - severe    Medications: Current Outpatient Medications  Medication Sig Dispense Refill   atenolol-chlorthalidone (TENORETIC) 100-25 MG per tablet Take 1 tablet by mouth daily.     atorvastatin (LIPITOR) 20 MG tablet Take 20 mg by mouth at bedtime.     benzocaine (AMERICAINE) 20 % rectal ointment Place rectally every 3 (three) hours as needed for pain. 56 g 0   BUDESONIDE NA Place 1 spray into both nostrils daily.     busPIRone (BUSPAR) 10 MG tablet Take 10 mg by mouth 3 (three) times daily.  0   Dapagliflozin Pro-metFORMIN ER 05-999 MG TB24 Take 1 tablet by mouth daily at 6 (six) AM.     dorzolamide-timolol (COSOPT) 2-0.5 % ophthalmic solution Place 1 drop into the left eye 2 (two) times daily.     Eszopiclone 3 MG TABS Take 3 mg by mouth at bedtime. Take immediately before bedtime     ferrous sulfate 325 (65 FE) MG  tablet Take 325 mg by mouth daily.     glucose blood test strip Use to check blood sugar once a day. 100 each 12   hydrocortisone (ANUSOL-HC) 25 MG suppository Place 1 suppository (25 mg total) rectally 2 (two) times daily. 24 suppository 1   hydrOXYzine (ATARAX/VISTARIL) 25 MG tablet Take 25 mg by mouth at bedtime.  0   levocetirizine (XYZAL) 5 MG tablet Take 5 mg by mouth every evening.     LORazepam (ATIVAN) 0.5 MG tablet Take 0.5 mg by mouth 3 (three) times daily as needed for anxiety.     Melatonin 10 MG TABS Take 10 mg by mouth at bedtime.     MICROLET LANCETS MISC Use to check blood sugar. 100 each 2   Multiple Vitamin (MULTIVITAMIN WITH MINERALS) TABS tablet Take 1 tablet by mouth daily. Multivitamin for Adults 50+ (Iron Free)  gummy     Omega-3 Fatty Acids (FISH OIL) 1000 MG CAPS Take 2,000 mg by mouth at bedtime.     omeprazole (PRILOSEC) 40 MG capsule Take 40 mg by mouth 2 (two) times daily.  Semaglutide 14 MG TABS Take 14 mg by mouth daily.     tiZANidine (ZANAFLEX) 4 MG tablet Take 4 mg by mouth at bedtime.     gabapentin (NEURONTIN) 300 MG capsule Take 300 mg by mouth at bedtime. (Patient not taking: Reported on 04/22/2023)     lisinopril (PRINIVIL,ZESTRIL) 2.5 MG tablet Take 5 mg by mouth at bedtime.     No current facility-administered medications for this visit.    Review of Systems: GENERAL: negative for malaise, night sweats HEENT: No changes in hearing or vision, no nose bleeds or other nasal problems. NECK: Negative for lumps, goiter, pain and significant neck swelling RESPIRATORY: Negative for cough, wheezing CARDIOVASCULAR: Negative for chest pain, leg swelling, palpitations, orthopnea GI: SEE HPI MUSCULOSKELETAL: Negative for joint pain or swelling, back pain, and muscle pain. SKIN: Negative for lesions, rash PSYCH: Negative for sleep disturbance, mood disorder and recent psychosocial stressors. HEMATOLOGY Negative for prolonged bleeding, bruising easily, and  swollen nodes. ENDOCRINE: Negative for cold or heat intolerance, polyuria, polydipsia and goiter. NEURO: negative for tremor, gait imbalance, syncope and seizures. The remainder of the review of systems is noncontributory.   Physical Exam: BP 124/79 (BP Location: Right Arm, Patient Position: Sitting, Cuff Size: Large)   Pulse 78   Temp 98.3 F (36.8 C) (Temporal)   Ht 6\' 1"  (1.854 m)   Wt 287 lb 4.8 oz (130.3 kg)   BMI 37.90 kg/m  GENERAL: The patient is AO x3, in no acute distress. Obese. HEENT: Head is normocephalic and atraumatic. EOMI are intact. Mouth is well hydrated and without lesions. NECK: Supple. No masses LUNGS: Clear to auscultation. No presence of rhonchi/wheezing/rales. Adequate chest expansion HEART: RRR, normal s1 and s2. ABDOMEN: Soft, nontender, no guarding, no peritoneal signs, and nondistended. BS +. No masses. EXTREMITIES: Without any cyanosis, clubbing, rash, lesions or edema. NEUROLOGIC: AOx3, no focal motor deficit. SKIN: no jaundice, no rashes  Imaging/Labs: as above  I personally reviewed and interpreted the available labs, imaging and endoscopic files.  Impression and Plan: Jaeon Giannino is a 57 y.o. male from [place] with PMH GERD, IBS, anxiety, diabetes, hypertension, hyperlipidemia, OSA,  who presents for follow up of rectal bleeding and chronic diarrhea.  The patient has presented significant improvement of his rectal bleeding after undergoing hemorrhoidal banding.  He is scheduled for further banding sessions in the future, which may prevent recurrent bleeding.  In terms of his chronic diarrhea and abdominal complaints, he is feeling somewhat better but still having frequent bowel movements.  Will start bile salt binder today at low-dose to attempt to control his symptoms further.  -Start colestipol 1 g qday -Can stop Imodium once you start colestipol. If still with diarrhea can increase colestipol to 2 g per day. -Proceed with more  banding sessions as scheduled  All questions were answered.      Scott Blazing, MD Gastroenterology and Hepatology [facility]

## 2023-05-02 ENCOUNTER — Encounter: Payer: Self-pay | Admitting: Primary Care

## 2023-05-02 ENCOUNTER — Ambulatory Visit: Payer: 59 | Admitting: Primary Care

## 2023-05-02 VITALS — BP 122/68 | HR 75 | Temp 98.2°F | Ht 74.0 in | Wt 287.0 lb

## 2023-05-02 DIAGNOSIS — G4730 Sleep apnea, unspecified: Secondary | ICD-10-CM | POA: Diagnosis not present

## 2023-05-02 DIAGNOSIS — G4733 Obstructive sleep apnea (adult) (pediatric): Secondary | ICD-10-CM | POA: Diagnosis not present

## 2023-05-02 DIAGNOSIS — G4700 Insomnia, unspecified: Secondary | ICD-10-CM | POA: Insufficient documentation

## 2023-05-02 NOTE — Assessment & Plan Note (Signed)
-   Patient has some difficulty initiating and maintaining sleep.  Previously on Lunesta 3 mg without much success.  Currently taking Atarax 25 to 50 mg and 10 mg melatonin.  Current regimen appears to be working better for patient.

## 2023-05-02 NOTE — Patient Instructions (Addendum)
Download on your phone showed compliance with CPAP, no significant apneas were noted  Recommendations: Stop lunesta  Continue hydroxyzine and melatonin for insomnia    Continue to wear CPAP nightly  Focus on side sleeping position (put a pillow behind your back) Try taking one 20-102min nap during with CPAP  Ok to try drinking sweet tea for caffeine  We will get CPAP titration study to assess pressure settings    Follow-up 6 weeks virtual visit to review CPAP titration study (Friday after noon)

## 2023-05-02 NOTE — Progress Notes (Signed)
Reviewed and agree with assessment/plan.   Coralyn Helling, MD [facility] 05/02/2023, 7:36 PM Pager:  [phone]

## 2023-05-02 NOTE — Progress Notes (Signed)
@Patient  ID: Scott [name], male    DOB: 03-Aug-1966, 57 y.o.   MRN: [id number]  Chief Complaint  Patient presents with   Consult    Sleep consult-wakes up a lot, needs more pressure, wearing CPAP    Referring provider: Samuella Cota  HPI: 57 year old male, never smoked. PMH severe sleep apnea, insomnia, IBS, hemorrhoid, palpitations.   05/02/2023 Patient presents today for sleep consult.  He is followed by [facility] sleep medicine.  He had a sleep study in 2006 showed severe obstructive sleep apnea, AHI 39.2 an hour with oxygen saturation minimum 69%.  Maintained on auto CPAP.  He reports symptoms of daytime sleepiness.  He is waking up on average twice a night with apnea symptoms/gasping.  He feels like CPAP pressure is not strong enough.  He reports pressure going up as high as 18 to 19 cm H2O.  Download was unavailable for review today.  We were able to access compliance report on my air app that showed patient is 100% compliant with CPAP use greater than 4 hours.  Average usage 8 hours 5 minutes.  Air leaks 0.0 L/min (95%).  Average AHI 1.27/h.  He primarily sleeps on his side but will wake up on his back.  He was taking Lunesta 3 mg at bedtime for insomnia however he tells me he has lost medication bottle recently and was unable to fill prescription with pharmacy. He has been taking 25 to 50 mg hydroxyzine and 10 milligrams melatonin.  He feels this regimen is actually working better for him.    Sleep questionnaire Symptoms-  Sleep apnea Prior sleep study- 2006 [place]  Bedtime- 10-11pm Time to fall asleep- 45 min Nocturnal awakenings- 2 times  Out of bed/start of day- 7am Weight changes- no Do you operate heavy machinery- no Do you currently wear CPAP- auto settings / ? 14-18cm h20 max average  Do you current wear oxygen- no Epworth- 11  Allergies  Allergen Reactions   Lactose Intolerance (Gi)    Trazodone Hcl     Other reaction(s): rapid heartbeat   Trulicity  [Dulaglutide] Other (See Comments)    Weakness - severe     There is no immunization history on file for this patient.  Past Medical History:  Diagnosis Date   Anemia    Anxiety    Arthritis    Back pain    pinched nerve sciatica   Diabetes mellitus without complication (HCC)    type  2   Elevated cholesterol with elevated triglycerides    Elevated liver enzymes 2014   normal now   GERD (gastroesophageal reflux disease)    Hypertension    Hypertension    Sleep apnea    uses cpap,setting of 14    Tobacco History: Social History   Tobacco Use  Smoking Status Never  Smokeless Tobacco Never   Counseling given: Not Answered   Outpatient Medications Prior to Visit  Medication Sig Dispense Refill   atenolol-chlorthalidone (TENORETIC) 100-25 MG per tablet Take 1 tablet by mouth daily.     atorvastatin (LIPITOR) 20 MG tablet Take 20 mg by mouth at bedtime.     BUDESONIDE NA Place 1 spray into both nostrils daily.     busPIRone (BUSPAR) 10 MG tablet Take 10 mg by mouth 3 (three) times daily.  0   colestipol (COLESTID) 1 g tablet Take 1 tablet (1 g total) by mouth daily. 90 tablet 1   Dapagliflozin Pro-metFORMIN ER 05-999 MG TB24 Take 1 tablet  by mouth daily at 6 (six) AM.     dorzolamide-timolol (COSOPT) 2-0.5 % ophthalmic solution Place 1 drop into the left eye 2 (two) times daily.     ferrous sulfate 325 (65 FE) MG tablet Take 325 mg by mouth daily.     gabapentin (NEURONTIN) 300 MG capsule Take 300 mg by mouth at bedtime.     glucose blood test strip Use to check blood sugar once a day. 100 each 12   hydrocortisone (ANUSOL-HC) 25 MG suppository Place 1 suppository (25 mg total) rectally 2 (two) times daily. 24 suppository 1   hydrOXYzine (ATARAX/VISTARIL) 25 MG tablet Take 25 mg by mouth at bedtime.  0   levocetirizine (XYZAL) 5 MG tablet Take 5 mg by mouth every evening.     lisinopril (PRINIVIL,ZESTRIL) 2.5 MG tablet Take 5 mg by mouth at bedtime.     LORazepam  (ATIVAN) 0.5 MG tablet Take 0.5 mg by mouth 3 (three) times daily as needed for anxiety.     Melatonin 10 MG TABS Take 10 mg by mouth at bedtime.     MICROLET LANCETS MISC Use to check blood sugar. 100 each 2   Multiple Vitamin (MULTIVITAMIN WITH MINERALS) TABS tablet Take 1 tablet by mouth daily. Multivitamin for Adults 50+ (Iron Free)  gummy     Omega-3 Fatty Acids (FISH OIL) 1000 MG CAPS Take 2,000 mg by mouth at bedtime.     omeprazole (PRILOSEC) 40 MG capsule Take 40 mg by mouth 2 (two) times daily.     Semaglutide 14 MG TABS Take 14 mg by mouth daily.     tiZANidine (ZANAFLEX) 4 MG tablet Take 4 mg by mouth at bedtime.     benzocaine (AMERICAINE) 20 % rectal ointment Place rectally every 3 (three) hours as needed for pain. (Patient not taking: Reported on 05/02/2023) 56 g 0   Eszopiclone 3 MG TABS Take 3 mg by mouth at bedtime. Take immediately before bedtime (Patient not taking: Reported on 05/02/2023)     No facility-administered medications prior to visit.      Review of Systems  Review of Systems  Constitutional:  Positive for fatigue.  HENT: Negative.    Respiratory:  Positive for apnea.   Psychiatric/Behavioral:  Positive for sleep disturbance.      Physical Exam  BP 122/68 (BP Location: Right Arm, Cuff Size: Large)   Pulse 75   Temp 98.2 F (36.8 C) (Temporal)   Ht 6\' 2"  (1.88 m)   Wt 287 lb (130.2 kg)   SpO2 98%   BMI 36.85 kg/m  Physical Exam Constitutional:      Appearance: Normal appearance. He is obese.  HENT:     Head: Normocephalic and atraumatic.     Mouth/Throat:     Mouth: Mucous membranes are moist.     Pharynx: Oropharynx is clear.  Cardiovascular:     Rate and Rhythm: Normal rate and regular rhythm.  Pulmonary:     Effort: Pulmonary effort is normal.     Breath sounds: Normal breath sounds.  Musculoskeletal:        General: Normal range of motion.  Skin:    General: Skin is warm and dry.  Neurological:     General: No focal deficit  present.     Mental Status: He is alert. Mental status is at baseline.  Psychiatric:        Mood and Affect: Mood normal.        Behavior: Behavior normal.  Thought Content: Thought content normal.        Judgment: Judgment normal.      Lab Results:  CBC    Component Value Date/Time   WBC 5.0 02/18/2023 0827   RBC 4.99 02/18/2023 0827   HGB 14.7 02/18/2023 0827   HCT 43.3 02/18/2023 0827   PLT 207 02/18/2023 0827   MCV 86.8 02/18/2023 0827   MCH 29.5 02/18/2023 0827   MCHC 33.9 02/18/2023 0827   RDW 12.0 02/18/2023 0827   LYMPHSABS 1.5 02/18/2023 0827   MONOABS 0.5 02/18/2023 0827   EOSABS 0.1 02/18/2023 0827   BASOSABS 0.0 02/18/2023 0827    BMET    Component Value Date/Time   NA 135 02/18/2023 0827   K 3.0 (L) 02/18/2023 0827   CL 99 02/18/2023 0827   CO2 25 02/18/2023 0827   GLUCOSE 125 (H) 02/18/2023 0827   BUN 16 02/18/2023 0827   CREATININE 0.71 02/18/2023 0827   CALCIUM 8.9 02/18/2023 0827   GFRNONAA >60 02/18/2023 0827    BNP No results found for: "BNP"  ProBNP No results found for: "PROBNP"  Imaging: No results found.   Assessment & Plan:   Severe sleep apnea - Patient had sleep study in 2006 showed severe obstructive sleep apnea, AHI 39.2 an hour.  Maintained on auto CPAP.  Reports waking up with apnea symptoms/gasping for air on occasion.  Associated daytime sleepiness. Epworth 7.  BMI 36.  He does not feel CPAP pressure is strong enough. MyAir reviewed, he is 100% compliant with CPAP use.  Average usage 8 hours 5 minutes.  No significant air leaks.  Average AHI 1.2 an hour.  Needs CPAP titration study to reassess pressure settings.  Insomnia - Patient has some difficulty initiating and maintaining sleep.  Previously on Lunesta 3 mg without much success.  Currently taking Atarax 25 to 50 mg and 10 mg melatonin.  Current regimen appears to be working better for patient.    Glenford Bayley, NP 05/02/2023

## 2023-05-02 NOTE — Assessment & Plan Note (Signed)
-   Patient had sleep study in 2006 showed severe obstructive sleep apnea, AHI 39.2 an hour.  Maintained on auto CPAP.  Reports waking up with apnea symptoms/gasping for air on occasion.  Associated daytime sleepiness. Epworth 7.  BMI 36.  He does not feel CPAP pressure is strong enough. MyAir reviewed, he is 100% compliant with CPAP use.  Average usage 8 hours 5 minutes.  No significant air leaks.  Average AHI 1.2 an hour.  Needs CPAP titration study to reassess pressure settings.

## 2023-05-09 ENCOUNTER — Encounter: Payer: Self-pay | Admitting: Gastroenterology

## 2023-05-09 ENCOUNTER — Telehealth: Payer: Self-pay | Admitting: Physical Medicine and Rehabilitation

## 2023-05-09 ENCOUNTER — Ambulatory Visit
Admission: RE | Admit: 2023-05-09 | Discharge: 2023-05-09 | Disposition: A | Discharge status: Home / Self Care | Payer: 59 | Source: Ambulatory Visit | Attending: Nurse Practitioner | Admitting: Nurse Practitioner

## 2023-05-09 ENCOUNTER — Ambulatory Visit: Payer: 59

## 2023-05-09 ENCOUNTER — Other Ambulatory Visit: Payer: Self-pay

## 2023-05-09 ENCOUNTER — Ambulatory Visit: Payer: 59 | Admitting: Gastroenterology

## 2023-05-09 VITALS — BP 124/76 | HR 75 | Temp 98.5°F | Resp 18

## 2023-05-09 VITALS — BP 129/88 | HR 99 | Temp 97.7°F | Ht 74.0 in | Wt 286.4 lb

## 2023-05-09 DIAGNOSIS — K641 Second degree hemorrhoids: Secondary | ICD-10-CM | POA: Diagnosis not present

## 2023-05-09 DIAGNOSIS — Z8739 Personal history of other diseases of the musculoskeletal system and connective tissue: Secondary | ICD-10-CM

## 2023-05-09 DIAGNOSIS — K625 Hemorrhage of anus and rectum: Secondary | ICD-10-CM

## 2023-05-09 DIAGNOSIS — M546 Pain in thoracic spine: Secondary | ICD-10-CM

## 2023-05-09 MED ORDER — DEXAMETHASONE SODIUM PHOSPHATE 10 MG/ML IJ SOLN
10.0000 mg | Freq: Once | INTRAMUSCULAR | Status: AC
  Administered 2023-05-09: 10 mg via INTRAMUSCULAR

## 2023-05-09 MED ORDER — KETOROLAC TROMETHAMINE 30 MG/ML IJ SOLN
30.0000 mg | Freq: Once | INTRAMUSCULAR | Status: AC
  Administered 2023-05-09: 30 mg via INTRAMUSCULAR

## 2023-05-09 NOTE — ED Triage Notes (Signed)
Pain under left shoulder blade that radiates to left arm x 1 week.  States pain gets worse through out the day. States pain is a burning and sore feeling.  Has been taking ibuprofen with some relief.

## 2023-05-09 NOTE — Telephone Encounter (Signed)
Patient called wanting to schedule to seen to see if he needs a MRI. He just came from the Urgent care.CB#[phone]

## 2023-05-09 NOTE — ED Provider Notes (Signed)
[facility]    CSN: [id number] Arrival date & time: 05/09/23  0858      History   Chief Complaint Chief Complaint  Patient presents with   Back Pain    Entered by patient    HPI Scott [name] is a 57 y.o. male.   The history is provided by the patient.   Patient presents for complaints of pain under the left shoulder blade with radiation into the left arm that is been present for the past week.  Patient states pain worsens as the day progresses.  He describes the pain as "burning".  He also states that he has some numbness in his arm from time to time.  He does have decreased range of motion of the left shoulder as a result of the back pain.  Denies injury, trauma, bruising, or swelling.  Patient with history of lumbar radiculopathy, has seen orthopedics in the past.  He currently is taking tizanidine and gabapentin along with ibuprofen for his pain.  Patient reports that he is diabetic, most recent hemoglobin A1c was 7 per the patient's report.  Patient is requesting an x-ray of his back. Past Medical History:  Diagnosis Date   Anemia    Anxiety    Arthritis    Back pain    pinched nerve sciatica   Diabetes mellitus without complication (HCC)    type  2   Elevated cholesterol with elevated triglycerides    Elevated liver enzymes 2014   normal now   GERD (gastroesophageal reflux disease)    Hypertension    Hypertension    Sleep apnea    uses cpap,setting of 14    Patient Active Problem List   Diagnosis Date Noted   Severe sleep apnea 05/02/2023   Insomnia 05/02/2023   Hemorrhoids 04/22/2023   IBS (irritable bowel syndrome) 12/24/2022   Palpitations 02/21/2016    Past Surgical History:  Procedure Laterality Date   BIOPSY  03/12/2023   Procedure: BIOPSY;  Surgeon: Dolores Frame, MD;  [facility];  Service: Gastroenterology;;   CATARACT EXTRACTION Left    COLONOSCOPY N/A 09/29/2012   Procedure: COLONOSCOPY;  Surgeon: Charna Elizabeth, MD;  Location: [facility];  Service: Endoscopy;  Laterality: N/A;   COLONOSCOPY WITH PROPOFOL N/A 06/22/2016   Procedure: COLONOSCOPY WITH PROPOFOL;  Surgeon: Jeani Hawking, MD;  Location: [facility];  Service: Endoscopy;  Laterality: N/A;   COLONOSCOPY WITH PROPOFOL N/A 05/20/2020   Procedure: COLONOSCOPY WITH PROPOFOL;  Surgeon: Jeani Hawking, MD;  Location: [facility];  Service: Endoscopy;  Laterality: N/A;   COLONOSCOPY WITH PROPOFOL N/A 03/12/2023   Procedure: COLONOSCOPY WITH PROPOFOL;  Surgeon: Dolores Frame, MD;  [facility];  Service: Gastroenterology;  Laterality: N/A;  11:00am;asa 3, can be done in Room 1 if rescheduled soon 7/15 cy   ENTEROSCOPY N/A 05/20/2020   Procedure: ENTEROSCOPY;  Surgeon: Jeani Hawking, MD;  [facility]: [facility];  Service: Endoscopy;  Laterality: N/A;   ESOPHAGOGASTRODUODENOSCOPY N/A 09/29/2012   Procedure: ESOPHAGOGASTRODUODENOSCOPY (EGD);  Surgeon: Charna Elizabeth, MD;  [facility];  Service: Endoscopy;  Laterality: N/A;   ESOPHAGOGASTRODUODENOSCOPY (EGD) WITH PROPOFOL N/A 06/22/2016   Procedure: ESOPHAGOGASTRODUODENOSCOPY (EGD) WITH PROPOFOL;  Surgeon: Jeani Hawking, MD;  Location: [facility];  Service: Endoscopy;  Laterality: N/A;   FRACTURE SURGERY     right wrist with screws   HERNIA REPAIR  2008   inguinal   KNEE ARTHROSCOPY Left 06/24/2020   Procedure: ARTHROSCOPY KNEE WITH PARTIAL MEDIAL MENISCECTOMY,  CHONDROPLASTY,  OPEN CYSTECTOMY;  Surgeon: Jodi Geralds, MD;  Location: [facility];  Service: Orthopedics;  Laterality: Left;       Home Medications    Prior to Admission medications   Medication Sig Start Date End Date Taking? Authorizing Provider  atenolol-chlorthalidone (TENORETIC) 100-25 MG per tablet Take 1 tablet by mouth daily.    [provider]  atorvastatin (LIPITOR) 20 MG tablet Take 20 mg by mouth at bedtime.    [provider]  benzocaine (AMERICAINE) 20 % rectal ointment Place  rectally every 3 (three) hours as needed for pain. Patient not taking: Reported on 05/02/2023 10/01/22   Paden Kuras-Warren, Sadie Haber, NP  BUDESONIDE NA Place 1 spray into both nostrils daily.    [provider]  busPIRone (BUSPAR) 10 MG tablet Take 10 mg by mouth 3 (three) times daily. 07/07/15   [provider]  colestipol (COLESTID) 1 g tablet Take 1 tablet (1 g total) by mouth daily. 04/22/23   Dolores Frame, MD  Dapagliflozin Pro-metFORMIN ER 05-999 MG TB24 Take 1 tablet by mouth daily at 6 (six) AM.    [provider]  dorzolamide-timolol (COSOPT) 2-0.5 % ophthalmic solution Place 1 drop into the left eye 2 (two) times daily.    [provider]  ferrous sulfate 325 (65 FE) MG tablet Take 325 mg by mouth daily.    [provider]  gabapentin (NEURONTIN) 300 MG capsule Take 300 mg by mouth at bedtime.    [provider]  glucose blood test strip Use to check blood sugar once a day. 07/02/16   Carlus Pavlov, MD  hydrocortisone (ANUSOL-HC) 25 MG suppository Place 1 suppository (25 mg total) rectally 2 (two) times daily. 10/01/22   [place] Sorn-Warren, Sadie Haber, NP  hydrOXYzine (ATARAX/VISTARIL) 25 MG tablet Take 25 mg by mouth at bedtime. 07/04/15   [provider]  levocetirizine (XYZAL) 5 MG tablet Take 5 mg by mouth every evening.    [provider]  lisinopril (PRINIVIL,ZESTRIL) 2.5 MG tablet Take 5 mg by mouth at bedtime.    [provider]  LORazepam (ATIVAN) 0.5 MG tablet Take 0.5 mg by mouth 3 (three) times daily as needed for anxiety.    [provider]  Melatonin 10 MG TABS Take 10 mg by mouth at bedtime.    [provider]  MICROLET LANCETS MISC Use to check blood sugar. 07/02/16   Carlus Pavlov, MD  Multiple Vitamin (MULTIVITAMIN WITH MINERALS) TABS tablet Take 1 tablet by mouth daily. Multivitamin for Adults 50+ (Iron Free)  gummy    [provider]  Omega-3 Fatty Acids  (FISH OIL) 1000 MG CAPS Take 2,000 mg by mouth at bedtime.    [provider]  omeprazole (PRILOSEC) 40 MG capsule Take 40 mg by mouth 2 (two) times daily. 05/09/20   [provider]  Semaglutide 14 MG TABS Take 14 mg by mouth daily. 04/29/20   [provider]  tiZANidine (ZANAFLEX) 4 MG tablet Take 4 mg by mouth at bedtime. 04/29/20   [provider]  liraglutide (VICTOZA) 18 MG/3ML SOPN Inject 1.2 mg into the skin every evening.  11/02/19  [provider]    Family History Family History  Problem Relation Age of Onset   Diabetes Mother    Hypertension Mother    Hyperlipidemia Mother    Cancer Father    Thyroid disease Sister     Social History Social History   Tobacco Use   Smoking status: Never  Smokeless tobacco: Never  Vaping Use   Vaping status: Never Used  Substance Use Topics   Alcohol use: Yes    Comment: rarely   Drug use: Not Currently    Types: Marijuana    Comment: rare marijuana use only last used 3 months ago     Allergies   Lactose intolerance (gi), Trazodone hcl, and Trulicity [dulaglutide]   Review of Systems Review of Systems Per HPI  Physical Exam Triage Vital Signs ED Triage Vitals  Encounter Vitals Group     BP 05/09/23 0938 124/76     Systolic BP Percentile --      Diastolic BP Percentile --      Pulse Rate 05/09/23 0938 75     Resp 05/09/23 0938 18     Temp 05/09/23 0938 98.5 F (36.9 C)     Temp Source 05/09/23 0938 Oral     SpO2 05/09/23 0938 94 %     Weight --      Height --      Head Circumference --      Peak Flow --      Pain Score 05/09/23 0940 4     Pain Loc --      Pain Education --      Exclude from Growth Chart --    No data found.  Updated Vital Signs BP 124/76 (BP Location: Right Arm)   Pulse 75   Temp 98.5 F (36.9 C) (Oral)   Resp 18   SpO2 94%   Visual Acuity Right Eye Distance:   Left Eye Distance:   Bilateral Distance:    Right Eye Near:   Left Eye Near:     Bilateral Near:     Physical Exam Vitals and nursing note reviewed.  Constitutional:      General: He is not in acute distress.    Appearance: Normal appearance.  Eyes:     Extraocular Movements: Extraocular movements intact.     Pupils: Pupils are equal, round, and reactive to light.  Cardiovascular:     Rate and Rhythm: Regular rhythm.     Pulses: Normal pulses.     Heart sounds: Normal heart sounds.  Pulmonary:     Effort: Pulmonary effort is normal.     Breath sounds: Normal breath sounds.  Musculoskeletal:     Cervical back: Normal range of motion.     Thoracic back: Spasms (Left paraspinal region) and tenderness present. No swelling, edema or signs of trauma. Decreased range of motion.       Back:  Skin:    General: Skin is warm and dry.  Neurological:     General: No focal deficit present.     Mental Status: He is alert and oriented to person, place, and time.  Psychiatric:        Mood and Affect: Mood normal.        Behavior: Behavior normal.      UC Treatments / Results  Labs (all labs ordered are listed, but only abnormal results are displayed) Labs Reviewed - No data to display  EKG   Radiology DG Thoracic Spine 2 View  Result Date: 05/09/2023 CLINICAL DATA:  Thoracic spine pain for 1 week EXAM: THORACIC SPINE 2 VIEWS COMPARISON:  None Available. FINDINGS: Thoracic vertebral body heights are preserved, without evidence of acute injury. There is mild S shaped scoliotic curvature. There is no antero or retrolisthesis. The disc heights appear preserved. There is no significant degenerative change. The imaged heart  and lungs unremarkable. IMPRESSION: Mild S shaped scoliotic curvature.  No acute finding. Electronically Signed   By: Lesia Hausen M.D.   On: 05/09/2023 11:45    Procedures Procedures (including critical care time)  Medications Ordered in UC Medications  ketorolac (TORADOL) 30 MG/ML injection 30 mg (30 mg Intramuscular Given 05/09/23 1001)   dexamethasone (DECADRON) injection 10 mg (10 mg Intramuscular Given 05/09/23 0959)    Initial Impression / Assessment and Plan / UC Course  I have reviewed the triage vital signs and the nursing notes.  Pertinent labs & imaging results that were available during my care of the patient were reviewed by me and considered in my medical decision making (see chart for details).  The patient is well-appearing, he is in no acute distress, vital signs are stable.  Awaiting x-ray of the thoracic spine.  Patient will be contacted once the result is received.  Patient was advised of same.  Decadron 10 mg IM and Toradol 30 mg IM administered for pain and inflammation.  Patient advised to continue gabapentin and tizanidine he is currently taking along with ibuprofen.  Supportive care recommendations were provided and discussed with the patient to include the use of ice or heat and gentle stretching exercises.  Advised patient to follow-up with orthopedics if symptoms do not improve with this treatment.  Also provided strict ER follow-up precautions with the patient.  Patient is in agreement with this plan of care and verbalizes understanding.  All questions were answered.  Patient stable for discharge.,  Called patient regarding xray results, 2 patient identifiers were used.  Advised patient that x-ray of the thoracic spine did not show any acute findings, did note the mild curvature in his back.  Patient advised to follow-up with orthopedics as discussed.  Patient is in agreement with this plan of care and verbalized understanding.  All questions were answered.  Final Clinical Impressions(s) / UC Diagnoses   Final diagnoses:  Left-sided thoracic back pain, unspecified chronicity  History of low back pain     Discharge Instructions      Your x-ray result is pending.  You will be contacted once the x-ray result is received. Continue your current medications as prescribed. Recommend the use of ice or  heat.  Apply ice for pain or swelling, heat for spasm or stiffness.  Apply for 20 minutes, remove for 1 hour, repeat as needed. Gentle range of motion exercises for your back to help decrease recovery time.  I have provided exercises for you to perform at home. Go to the emergency department immediately if you experience worsening numbness in the right arm, or numbness and tingling in both arms, shortness of breath, difficulty breathing, or worsening pain. As discussed, it is recommended that you follow-up with orthopedics for further evaluation of your back pain. Follow-up as needed.,     ED Prescriptions   None    PDMP not reviewed this encounter.   Abran Cantor, NP 05/09/23 1241

## 2023-05-09 NOTE — Progress Notes (Signed)
   [facility] Banding Procedure Note:   Scott [name] is a 57 y.o. male presenting today for consideration of hemorrhoid banding. Last colonoscopy 03/12/23 with hemorrhoids on perianal exam, normal ileum, sigmoid diverticulosis s/p biopsy, large nonbleeding internal hemorrhoids, reporting 1 that was slightly ulcerated.  Random biopsies negative for microscopic colitis. .  Latex Allergy: no  Interval History: Has had great relief from most recent banding.  Having very minimal bleeding without any clots since his last visit.  Also recently has been using colestipol to help with his looser stools and this is also helping him very much.  The patient presents with symptomatic grade 2 hemorrhoids, unresponsive to maximal medical therapy, requesting rubber band ligation of his/her hemorrhoidal disease. All risks, benefits, and alternative forms of therapy were described and informed consent was obtained.  The decision was made to band the left lateral internal hemorrhoid (had to be performed in the neutral position given patient discomfort) and the [facility] O'Regan System was used to perform band ligation without complication. Digital anorectal examination was then performed to assure proper positioning of the band, and to adjust the banded tissue as required. The patient was discharged home without pain or other issues. Dietary and behavioral recommendations were given along with follow-up instructions. The patient will return in 2-3 weeks for followup and possible additional banding as required.  No complications were encountered and the patient tolerated the procedure well.    Brooke Bonito, MSN, FNP-BC, AGACNP-BC [facility]

## 2023-05-09 NOTE — Patient Instructions (Addendum)
Continue to avoid straining. Limit toilet time to 2-3 minutes at the most.   Continue taking colestipol to help with looser stools.  Occasionally, you may have more bleeding than usual after the banding procedure. This is often from the untreated hemorrhoids rather than the treated one. Don't be concerned if there is a tablespoon or so of blood. If there is more blood than this, lie flat with your bottom higher than your head and apply an ice pack to the area. If the bleeding does not stop within a half an hour or if you feel faint, have severe pain, chills, fever or difficulty passing urine (very rare) or other problems, you should call us at [phone] or report to the nearest emergency room. Please call me with any concerns!  The procedure you have had should have been relatively painless since the banding of the area involved does not have nerve endings and there is no pain sensation. The rubber band cuts off the blood supply to the hemorrhoid and the band may fall off as soon as 48 hours after the banding (the band may occasionally be seen in the toilet bowl following a bowel movement). You may notice a temporary feeling of fullness in the rectum which should respond adequately to plain Tylenol or Motrin.  I will see you back in 2-3 weeks for follow-up for possible additional banding.   Brooke Bonito, MSN, FNP-BC, AGACNP-BC [facility]

## 2023-05-09 NOTE — Discharge Instructions (Addendum)
Your x-ray result is pending.  You will be contacted once the x-ray result is received. Continue your current medications as prescribed. Recommend the use of ice or heat.  Apply ice for pain or swelling, heat for spasm or stiffness.  Apply for 20 minutes, remove for 1 hour, repeat as needed. Gentle range of motion exercises for your back to help decrease recovery time.  I have provided exercises for you to perform at home. Go to the emergency department immediately if you experience worsening numbness in the right arm, or numbness and tingling in both arms, shortness of breath, difficulty breathing, or worsening pain. As discussed, it is recommended that you follow-up with orthopedics for further evaluation of your back pain. Follow-up as needed.,

## 2023-05-14 NOTE — Telephone Encounter (Signed)
LVM to return call to clinic.

## 2023-05-17 ENCOUNTER — Telehealth: Payer: Self-pay | Admitting: Primary Care

## 2023-05-17 NOTE — Telephone Encounter (Signed)
PT calling to cancel FU  appt because lab sleep study has not been sched. I am asking him to keep appt and wait for a call back on when we can get him in for the lab study. His # is [phone]

## 2023-05-29 ENCOUNTER — Other Ambulatory Visit: Payer: Self-pay | Admitting: Family Medicine

## 2023-05-29 DIAGNOSIS — M5412 Radiculopathy, cervical region: Secondary | ICD-10-CM

## 2023-05-30 ENCOUNTER — Ambulatory Visit: Payer: 59 | Admitting: Gastroenterology

## 2023-05-30 ENCOUNTER — Encounter: Payer: Self-pay | Admitting: Gastroenterology

## 2023-05-30 VITALS — BP 119/73 | HR 89 | Temp 97.9°F | Ht 74.0 in | Wt 285.8 lb

## 2023-05-30 DIAGNOSIS — K641 Second degree hemorrhoids: Secondary | ICD-10-CM

## 2023-05-30 NOTE — Progress Notes (Signed)
   [facility] Banding Procedure Note:   Scott [name] is a 57 y.o. male presenting today for consideration of hemorrhoid banding. Last colonoscopy 03/12/23 with hemorrhoids on perianal exam, normal ileum, sigmoid diverticulosis s/p biopsy, large nonbleeding internal hemorrhoids, reporting 1 that was slightly ulcerated.  Random biopsies negative for microscopic colitis .  Latex Allergy: no  Interval History: Doing well. Very minor bleeding.  Having more of a regular/formed stool.  The patient presents with symptomatic grade 2 hemorrhoids, unresponsive to maximal medical therapy, requesting rubber band ligation of his/her hemorrhoidal disease. All risks, benefits, and alternative forms of therapy were described and informed consent was obtained.  The decision was made to band neutrally, and the [facility] O'Regan System was used to perform band ligation without complication. Band assess to be in the right anterior position. Digital anorectal examination was then performed to assure proper positioning of the band, and to adjust the banded tissue as required. The patient was discharged home without pain or other issues. Dietary and behavioral recommendations were given and (if necessary prescriptions were given), along with follow-up instructions. The patient will return in 6 months for followup or as needed.   No complications were encountered and the patient tolerated the procedure well.   Brooke Bonito, MSN, FNP-BC, AGACNP-BC [facility]

## 2023-05-30 NOTE — Patient Instructions (Signed)
Continue colestipol daily.   Occasionally, you may have more bleeding than usual after the banding procedure. This is often from the untreated hemorrhoids rather than the treated one. Don't be concerned if there is a tablespoon or so of blood. If there is more blood than this, lie flat with your bottom higher than your head and apply an ice pack to the area. If the bleeding does not stop within a half an hour or if you feel faint, have severe pain, chills, fever or difficulty passing urine (very rare) or other problems, you should call us at [phone] or report to the nearest emergency room. Please call me with any concerns!  The procedure you have had should have been relatively painless since the banding of the area involved does not have nerve endings and there is no pain sensation. The rubber band cuts off the blood supply to the hemorrhoid and the band may fall off as soon as 48 hours after the banding (the band may occasionally be seen in the toilet bowl following a bowel movement). You may notice a temporary feeling of fullness in the rectum which should respond adequately to plain Tylenol or Motrin.  Follow up in 6 months, sooner if needed.    Brooke Bonito, MSN, FNP-BC, AGACNP-BC [facility]

## 2023-05-31 ENCOUNTER — Other Ambulatory Visit (HOSPITAL_COMMUNITY): Payer: Self-pay | Admitting: Family Medicine

## 2023-05-31 DIAGNOSIS — M5412 Radiculopathy, cervical region: Secondary | ICD-10-CM

## 2023-06-11 ENCOUNTER — Ambulatory Visit (HOSPITAL_COMMUNITY)
Admission: RE | Admit: 2023-06-11 | Discharge: 2023-06-11 | Disposition: A | Discharge status: Home / Self Care | Payer: 59 | Source: Ambulatory Visit | Attending: Family Medicine | Admitting: Family Medicine

## 2023-06-11 DIAGNOSIS — M5412 Radiculopathy, cervical region: Secondary | ICD-10-CM | POA: Diagnosis present

## 2023-06-13 ENCOUNTER — Telehealth: Payer: Self-pay | Admitting: Primary Care

## 2023-06-13 NOTE — Telephone Encounter (Signed)
Patient has not had a sleep study scheduled. Order is in place but hasn't been scheduled . Please call patient to get study scheduled.

## 2023-06-14 ENCOUNTER — Telehealth: Payer: 59 | Admitting: Primary Care

## 2023-06-17 NOTE — Telephone Encounter (Signed)
Per Lanna Poche, CPAP tritration study has been scheduled for 12/9.  Nothing further needed.

## 2023-06-28 NOTE — Telephone Encounter (Signed)
One month old. Please reply and/or close encounter. Thanks.,

## 2023-06-30 NOTE — Telephone Encounter (Signed)
Patient is scheduled and aware of appt.

## 2023-07-07 ENCOUNTER — Ambulatory Visit: Payer: 59

## 2023-07-08 NOTE — Telephone Encounter (Signed)
Scott [name], Patient is requesting to have his follow up as virtual on 08/12/23, it looks like it will be a f/u after a CPAP titration study.  Please advise.  Thank you.

## 2023-07-08 NOTE — Telephone Encounter (Signed)
That's fine

## 2023-07-15 ENCOUNTER — Ambulatory Visit (HOSPITAL_BASED_OUTPATIENT_CLINIC_OR_DEPARTMENT_OTHER): Payer: 59 | Attending: Primary Care | Admitting: Internal Medicine

## 2023-07-15 VITALS — Ht 74.0 in | Wt 279.0 lb

## 2023-07-15 DIAGNOSIS — G4733 Obstructive sleep apnea (adult) (pediatric): Secondary | ICD-10-CM | POA: Insufficient documentation

## 2023-07-20 DIAGNOSIS — G4733 Obstructive sleep apnea (adult) (pediatric): Secondary | ICD-10-CM | POA: Diagnosis not present

## 2023-07-20 NOTE — Procedures (Signed)
Patient Name: Scott [name], Gleave Date: 07/15/2023 Gender: Male D.O.B: 02-Mar-1966 Age (years): 47 Referring Provider: Ames Dura NP Height (inches): 74 Interpreting Physician: Jetty Duhamel MD, ABSM Weight (lbs): 279 RPSGT: Ulyess Mort BMI: 36 MRN: [id number] Neck Size: 18.00  CLINICAL INFORMATION The patient is referred for a CPAP titration to treat sleep apnea.  Date of NPSG, Split Night or HST: NPSG 2006 AHI 39.2/hr, desaturation to 69%  SLEEP STUDY TECHNIQUE As per the AASM Manual for the Scoring of Sleep and Associated Events v2.3 (April 2016) with a hypopnea requiring 4% desaturations.  The channels recorded and monitored were frontal, central and occipital EEG, electrooculogram (EOG), submentalis EMG (chin), nasal and oral airflow, thoracic and abdominal wall motion, anterior tibialis EMG, snore microphone, electrocardiogram, and pulse oximetry. Continuous positive airway pressure (CPAP) was initiated at the beginning of the study and titrated to treat sleep-disordered breathing.  MEDICATIONS Medications self-administered by patient taken the night of the study : ATORVASTATIN, BUSPAR, COSORT, FISH OIL, HYDROXYZINE, LISINOPRIL, LORAZEPAM, MAGNESIUM, MELATONIN, OMEPRAZOLE, TIZANIDINE  TECHNICIAN COMMENTS Comments added by technician: PATIENT WAS ORDERED AS A CPAP TITRATION Comments added by scorer: N/A  RESPIRATORY PARAMETERS Optimal PAP Pressure (cm): 18 AHI at Optimal Pressure (/hr): 0.9 Overall Minimal O2 (%): 90.0 Supine % at Optimal Pressure (%): 0 Minimal O2 at Optimal Pressure (%): 93.0   SLEEP ARCHITECTURE The study was initiated at 11:02:40 PM and ended at 5:10:49 AM.  Sleep onset time was 14.4 minutes and the sleep efficiency was 81.5%. The total sleep time was 300 minutes.  The patient spent 18.7% of the night in stage N1 sleep, 67.0% in stage N2 sleep, 0.0% in stage N3 and 14.3% in REM.Stage REM latency was 217.0 minutes  Wake after  sleep onset was 53.8. Alpha intrusion was absent. Supine sleep was 63.17%.  CARDIAC DATA The 2 lead EKG demonstrated sinus rhythm. The mean heart rate was 75.2 beats per minute. Other EKG findings include: None.  LEG MOVEMENT DATA The total Periodic Limb Movements of Sleep (PLMS) were 0. The PLMS index was 0.0. A PLMS index of <15 is considered normal in adults.  IMPRESSIONS - The optimal PAP pressure was 18 cm of water. - Central sleep apnea was not noted during this titration (CAI = 0.4/h). - Significant oxygen desaturations were not observed during this titration (min O2 = 90.0%). - The patient snored with soft snoring volume during this titration study. - No cardiac abnormalities were observed during this study. - Clinically significant periodic limb movements were not noted during this study. Arousals associated with PLMs were rare.  DIAGNOSIS - Obstructive Sleep Apnea (G47.33)  RECOMMENDATIONS - Trial of CPAP therapy on 18 cm H2O or autopap 10-20. - Patient wore a Large size Resmed Full Face AirFit F20 mask and heated humidification. - Be careful with alcohol, sedatives and other CNS depressants that may worsen sleep apnea and disrupt normal sleep architecture. - Sleep hygiene should be reviewed to assess factors that may improve sleep quality. - Weight management and regular exercise should be initiated or continued.  [Electronically signed] 07/20/2023 03:31 PM  Jetty Duhamel MD, ABSM Diplomate, American Board of Sleep Medicine NPI: [id number]                         Jetty Duhamel Diplomate, American Board of Sleep Medicine  ELECTRONICALLY SIGNED ON:  07/20/2023, 3:28 PM [facility] PH: [phone]   FX: [phone] ACCREDITED BY THE AMERICAN  ACADEMY OF SLEEP MEDICINE

## 2023-07-22 ENCOUNTER — Ambulatory Visit: Payer: Self-pay

## 2023-08-06 NOTE — Progress Notes (Addendum)
 Referring Physician:  Arloa Elsie SAUNDERS, MD [phone] [address],  [place] [address]  Primary Physician:  Arloa Elsie SAUNDERS, MD  History of Present Illness: 08/12/2023 Scott [name] is here today with a chief complaint of neck pain radiating down his left arm with associated numbness tingling in his 2nd through 5th digit.  He states he has had neck pain for approximately a year, but it was not until the last 6 months that he started having radiating pain.  He denies any inciting event.  Over the past few months he has noticed weakness.  He states this has involved all of a sudden dropping his coffee cup in his left hand as well as decreased typing speed due to pain, numbness and tingling as well as weakness.  He adds that his neck pain is a stabbing pain with burning in his left upper extremity.  He often hears a popping in his neck.  They notices that when he extends his neck backwards and looks up it increases the pain going down his left arm.  He takes gabapentin  at night which she states helps some, but makes him very drowsy.  He was previously seeing a chiropractor for his neck pain.  He denies any dysfunction with his gait or saddle anesthesia. The symptoms are causing a significant impact on the patient's life.   Review of Systems:  A 10 point review of systems is negative, except for the pertinent positives and negatives detailed in the HPI.  Past Medical History: Past Medical History:  Diagnosis Date   Anemia    Anxiety    Arthritis    Back pain    pinched nerve sciatica   Diabetes mellitus without complication (HCC)    type  2   Elevated cholesterol with elevated triglycerides    Elevated liver enzymes 2014   normal now   GERD (gastroesophageal reflux disease)    Hypertension    Hypertension    Sleep apnea    uses cpap,setting of 14    Past Surgical History: Past Surgical History:  Procedure Laterality Date   BIOPSY  03/12/2023   Procedure:  BIOPSY;  Surgeon: Eartha Angelia Sieving, MD;  [facility]: [facility];  Service: Gastroenterology;;   CATARACT EXTRACTION Left    COLONOSCOPY N/A 09/29/2012   Procedure: COLONOSCOPY;  Surgeon: Renaye Sous, MD;  Location: [facility];  Service: Endoscopy;  Laterality: N/A;   COLONOSCOPY WITH PROPOFOL  N/A 06/22/2016   Procedure: COLONOSCOPY WITH PROPOFOL ;  Surgeon: Belvie Just, MD;  Location: [facility];  Service: Endoscopy;  Laterality: N/A;   COLONOSCOPY WITH PROPOFOL  N/A 05/20/2020   Procedure: COLONOSCOPY WITH PROPOFOL ;  Surgeon: Just Belvie, MD;  Location: [facility];  Service: Endoscopy;  Laterality: N/A;   COLONOSCOPY WITH PROPOFOL  N/A 03/12/2023   Procedure: COLONOSCOPY WITH PROPOFOL ;  Surgeon: Eartha Angelia Sieving, MD;  Location: [facility];  Service: Gastroenterology;  Laterality: N/A;  11:00am;asa 3, can be done in Room 1 if rescheduled soon 7/15 cy   ENTEROSCOPY N/A 05/20/2020   Procedure: ENTEROSCOPY;  Surgeon: Just Belvie, MD;  Location: [facility];  Service: Endoscopy;  Laterality: N/A;   ESOPHAGOGASTRODUODENOSCOPY N/A 09/29/2012   Procedure: ESOPHAGOGASTRODUODENOSCOPY (EGD);  Surgeon: Renaye Sous, MD;  [facility]: [facility];  Service: Endoscopy;  Laterality: N/A;   ESOPHAGOGASTRODUODENOSCOPY (EGD) WITH PROPOFOL  N/A 06/22/2016   Procedure: ESOPHAGOGASTRODUODENOSCOPY (EGD) WITH PROPOFOL ;  Surgeon: Belvie Just, MD;  Location: [facility];  Service: Endoscopy;  Laterality: N/A;   FRACTURE SURGERY  right wrist with screws   HERNIA REPAIR  2008   inguinal   KNEE ARTHROSCOPY Left 06/24/2020   Procedure: ARTHROSCOPY KNEE WITH PARTIAL MEDIAL MENISCECTOMY, CHONDROPLASTY,  OPEN CYSTECTOMY;  Surgeon: Yvone Rush, MD;  Location: [facility];  Service: Orthopedics;  Laterality: Left;    Allergies: Allergies as of 08/12/2023 - Review Complete 08/12/2023  Allergen Reaction Noted   Lactose intolerance (gi)  10/01/2022   Trazodone hcl  07/05/2021   Trulicity  [dulaglutide] Other (See Comments) 07/02/2016    Medications: Outpatient Encounter Medications as of 08/12/2023  Medication Sig   atenolol-chlorthalidone (TENORETIC) 100-25 MG per tablet Take 1 tablet by mouth daily.   atorvastatin (LIPITOR) 20 MG tablet Take 20 mg by mouth at bedtime.   busPIRone (BUSPAR) 10 MG tablet Take 10 mg by mouth 3 (three) times daily.   Dapagliflozin Pro-metFORMIN  ER 05-999 MG TB24 Take 1 tablet by mouth daily at 6 (six) AM.   dorzolamide-timolol (COSOPT) 2-0.5 % ophthalmic solution Place 1 drop into the left eye 2 (two) times daily.   ferrous sulfate 325 (65 FE) MG tablet Take 325 mg by mouth daily.   fluticasone  (FLONASE ) 50 MCG/ACT nasal spray Place 1 spray into both nostrils daily.   gabapentin  (NEURONTIN ) 300 MG capsule Take 300 mg by mouth at bedtime.   glucose blood test strip Use to check blood sugar once a day.   HYDROcodone -acetaminophen  (NORCO) 10-325 MG tablet Take 1 tablet by mouth every 6 (six) hours as needed.   hydrOXYzine (ATARAX/VISTARIL) 25 MG tablet Take 25 mg by mouth at bedtime.   levocetirizine (XYZAL) 5 MG tablet Take 5 mg by mouth every evening.   levofloxacin (LEVAQUIN) 500 MG tablet Take 500 mg by mouth daily.   lisinopril (PRINIVIL,ZESTRIL) 2.5 MG tablet Take 5 mg by mouth at bedtime.   LORazepam (ATIVAN) 0.5 MG tablet Take 0.5 mg by mouth 3 (three) times daily as needed for anxiety.   Melatonin 10 MG TABS Take 10 mg by mouth at bedtime.   MICROLET LANCETS MISC Use to check blood sugar.   Multiple Vitamin (MULTIVITAMIN WITH MINERALS) TABS tablet Take 1 tablet by mouth daily. Multivitamin for Adults 50+ (Iron Free)  gummy   Omega-3 Fatty Acids (FISH OIL) 1000 MG CAPS Take 2,000 mg by mouth at bedtime.   omeprazole (PRILOSEC) 40 MG capsule Take 40 mg by mouth 2 (two) times daily.   Semaglutide 14 MG TABS Take 14 mg by mouth daily.   tiZANidine (ZANAFLEX) 4 MG tablet Take 4 mg by mouth at bedtime.   [DISCONTINUED] benzocaine  (AMERICAINE)  20 % rectal ointment Place rectally every 3 (three) hours as needed for pain. (Patient not taking: Reported on 05/02/2023)   [DISCONTINUED] BUDESONIDE NA Place 1 spray into both nostrils daily.   [DISCONTINUED] colestipol  (COLESTID ) 1 g tablet Take 1 tablet (1 g total) by mouth daily.   [DISCONTINUED] hydrocortisone  (ANUSOL -HC) 25 MG suppository Place 1 suppository (25 mg total) rectally 2 (two) times daily.   [DISCONTINUED] liraglutide (VICTOZA) 18 MG/3ML SOPN Inject 1.2 mg into the skin every evening.   No facility-administered encounter medications on file as of 08/12/2023.    Social History: Social History   Tobacco Use   Smoking status: Never   Smokeless tobacco: Never  Vaping Use   Vaping status: Never Used  Substance Use Topics   Alcohol use: Yes    Comment: rarely   Drug use: Not Currently    Types: Marijuana    Comment: rare marijuana use only last  used 3 months ago    Family Medical History: Family History  Problem Relation Age of Onset   Diabetes Mother    Hypertension Mother    Hyperlipidemia Mother    Cancer Father    Thyroid disease Sister     Physical Examination: General: Patient is well developed, well nourished, calm, collected, and in no apparent distress. Attention to examination is appropriate.  Psychiatric: Patient is non-anxious.  Head:  Pupils equal, round, and reactive to light.  ENT:  Oral mucosa appears well hydrated.  Neck:   Supple.  Decreased range of motion.  Respiratory: Patient is breathing without any difficulty.  Extremities: No edema.  Skin:   On exposed skin, there are no abnormal skin lesions.  NEUROLOGICAL:     Awake, alert, oriented to person, place, and time.  Speech is clear and fluent. Fund of knowledge is appropriate.   Cranial Nerves: Pupils equal round and reactive to light.  Facial tone is symmetric.  ROM of spine: Decreased in cervical spine.    +Spurlings.  Strength: Side Biceps Triceps Deltoid Interossei Grip  Wrist Ext. Wrist Flex.  R 5 5 5 5 5 5 5   L 5 4 5 5 5  4+ 5    Reflexes are 2+ in the right biceps, triceps, brachioradialis. 1+ in left triceps, 2+ in left biceps and BR.  Hoffman's is absent.  Decreased sensation in left upper extremity compared to the right. Gait is normal.   No difficulty with tandem gait.   No evidence of dysmetria noted.  Medical Decision Making  Imaging: MRI Cervical spine: IMPRESSION: 1. C6-C7 severe left neural foraminal narrowing. 2. C3-C4 mild left neural foraminal narrowing. 3. C4-C5 and C5-C6 mild right neural foraminal narrowing. 4. C7-T1 mild left neural foraminal narrowing. 5. No spinal canal stenosis   I have personally reviewed the images and agree with the above interpretation.  Assessment and Plan: Scott [name] is a pleasant 57 y.o. male is here today with a chief complaint of neck pain radiating down his left arm with associated numbness tingling in his 2nd through 5th digit.  He states he has had neck pain for approximately a year, but it was not until the last 6 months that he started having radiating pain.  He denies any inciting event.  Over the past few months he has noticed weakness.  He states this has involved all of a sudden dropping his coffee cup in his left hand as well as decreased typing speed due to pain, numbness and tingling as well as weakness.  He adds that his neck pain is a stabbing pain with burning in his left upper extremity.  He often hears a popping in his neck.  They notices that when he extends his neck backwards and looks up it increases the pain going down his left arm.  He takes gabapentin  at night which she states helps some, but makes him very drowsy.  He was previously seeing a chiropractor for his neck pain.  He denies any dysfunction with his gait or saddle anesthesia. The symptoms are causing a significant impact on the patient's life.  On examination, positive Spurling's.  Weakness noted in his left tricep and in  left wrist extension.  MRI of cervical spine showing severe left neural foraminal narrowing at C6-7.  Pleasure to see patient in clinic today.  Patient does have severe foraminal stenosis at C6-7 on the left side.  This is accompanied with weakness, pain, numbness and tingling.  At this  time will be recommended for patient to undergo a left C6 foraminotomy.  The risk and benefits of this procedure were discussed at length with the patient.  He agrees to continue.    Thank you for involving me in the care of this patient.  Lyle Decamp, PA-C Dept. of Neurosurgery     Addendum I came in to see the patient at the request of Lyle Decamp.  Patient has cervical disc disease and radiating pain and weakness in his left upper extremity.  On physical examination he does have significant weakness in his elbow extension as well as wrist extension.  His triceps are approximately 4 out of 5 with decreased reflexes.  Appears he has a symptomatic left-sided cervical radiculopathy at see 7, this correlates with his severe left-sided neuroforaminal stenosis at C6-7 next to connective tissue and disc disease.  Given his weakness we do feel that he would benefit from a cervical foraminotomies discectomy.  He would like to go forward with the procedure.

## 2023-08-12 ENCOUNTER — Ambulatory Visit
Admission: RE | Admit: 2023-08-12 | Discharge: 2023-08-12 | Disposition: A | Discharge status: Home / Self Care | Payer: 59 | Attending: Physician Assistant | Admitting: Physician Assistant

## 2023-08-12 ENCOUNTER — Other Ambulatory Visit: Payer: Self-pay

## 2023-08-12 ENCOUNTER — Ambulatory Visit
Admission: RE | Admit: 2023-08-12 | Discharge: 2023-08-12 | Disposition: A | Discharge status: Home / Self Care | Payer: 59 | Source: Ambulatory Visit | Attending: Physician Assistant | Admitting: Physician Assistant

## 2023-08-12 ENCOUNTER — Encounter: Payer: Self-pay | Admitting: Physician Assistant

## 2023-08-12 ENCOUNTER — Telehealth (INDEPENDENT_AMBULATORY_CARE_PROVIDER_SITE_OTHER): Payer: 59 | Admitting: Nurse Practitioner

## 2023-08-12 ENCOUNTER — Ambulatory Visit: Payer: 59 | Admitting: Physician Assistant

## 2023-08-12 ENCOUNTER — Encounter: Payer: Self-pay | Admitting: Nurse Practitioner

## 2023-08-12 VITALS — BP 118/86 | Ht 74.0 in | Wt 284.0 lb

## 2023-08-12 DIAGNOSIS — M5412 Radiculopathy, cervical region: Secondary | ICD-10-CM

## 2023-08-12 DIAGNOSIS — M4802 Spinal stenosis, cervical region: Secondary | ICD-10-CM

## 2023-08-12 DIAGNOSIS — G4733 Obstructive sleep apnea (adult) (pediatric): Secondary | ICD-10-CM

## 2023-08-12 DIAGNOSIS — R29898 Other symptoms and signs involving the musculoskeletal system: Secondary | ICD-10-CM | POA: Insufficient documentation

## 2023-08-12 DIAGNOSIS — M50123 Cervical disc disorder at C6-C7 level with radiculopathy: Secondary | ICD-10-CM | POA: Insufficient documentation

## 2023-08-12 DIAGNOSIS — G4730 Sleep apnea, unspecified: Secondary | ICD-10-CM

## 2023-08-12 DIAGNOSIS — Z01818 Encounter for other preprocedural examination: Secondary | ICD-10-CM

## 2023-08-12 DIAGNOSIS — M47812 Spondylosis without myelopathy or radiculopathy, cervical region: Secondary | ICD-10-CM | POA: Diagnosis not present

## 2023-08-12 NOTE — Patient Instructions (Addendum)
 Continue to use CPAP every night, minimum of 4-6 hours a night.  Change equipment as directed. Wash your tubing with warm soap and water daily, hang to dry. Wash humidifier portion weekly. Use bottled, distilled water and change daily Be aware of reduced alertness and do not drive or operate heavy machinery if experiencing this or drowsiness.  Exercise encouraged, as tolerated. Healthy weight management discussed.  Avoid or decrease alcohol consumption and medications that make you more sleepy, if possible. Notify if persistent daytime sleepiness occurs even with consistent use of PAP therapy.  Glad the mask change helped. Let us  know if you have any further difficulties   Follow up in one year with Landry or Izetta, NP, or sooner, if needed

## 2023-08-12 NOTE — Addendum Note (Signed)
 Addended by: Sharlot Gowda on: 08/12/2023 11:29 AM   Modules accepted: Orders

## 2023-08-12 NOTE — Patient Instructions (Signed)
  Please see below for information in regards to your upcoming surgery:   Planned surgery: C6-7 left sided foraminotomy   Surgery date: 09/17/23 at [facility]: [address] [place], [place] [address]) - you will find out your arrival time the business day before your surgery.   Pre-op appointment at [facility] Pre-admit Testing: we will call you with a date/time for this. If you are scheduled for an in person appointment, Pre-admit Testing is located on the first floor of the [facility], [address], [address]. Please bring all prescriptions in the original prescription bottles to your appointment. During this appointment, they will advise you which medications you can take the morning of surgery, and which medications you will need to hold for surgery. Labs (such as blood work, EKG) may be done at your pre-op appointment. You are not required to fast for these labs. Should you need to change your pre-op appointment, please call Pre-admit testing at [phone].       Diabetes/weight loss medications:  Dapagliflozin Pro-metFORMIN : hold for 3 days prior to surgery Semaglutide: hold for 1 days prior to surgery    Surgical clearance: we will send a clearance form to Dr Arloa. They may wish to see you in their office prior to signing the clearance form. If so, they may call you to schedule an appointment.    Common restrictions after surgery: No bending, lifting, or twisting ("BLT"). Avoid lifting objects heavier than 10 pounds for the first 6 weeks after surgery. Where possible, avoid household activities that involve lifting, bending, reaching, pushing, or pulling such as laundry, vacuuming, grocery shopping, and childcare. Try to arrange for help from friends and family for these activities while you heal. Do not drive while taking prescription pain medication. Weeks 6 through 12 after surgery: avoid lifting more than  25 pounds.     How to contact us :  If you have any questions/concerns before or after surgery, you can reach us  at [phone], or you can send a mychart message. We can be reached by phone or mychart 8am-4pm, Monday-Friday.  *Please note: Calls after 4pm are forwarded to a third party answering service. Mychart messages are not routinely monitored during evenings, weekends, and holidays. Please call our office to contact the answering service for urgent concerns during non-business hours.    Appointments/FMLA & disability paperwork: Odetta Mora, & Ritta Registered Nurse/Surgery scheduler: [facility] Assistants: Damien ODESSIA Sailors Physician Assistants: Lyle Decamp, PA-C, Edsel Goods, PA-C & Glade Boys, PA-C Surgeons: Reeves Daisy, MD & Penne Sharps, MD

## 2023-08-12 NOTE — Progress Notes (Signed)
 Patient ID: Scott [name], male     DOB: Jul 17, 1966, 58 y.o.      MRN: [id number]  Chief Complaint  Patient presents with   Follow-up    Review sleep study.  Patient was told as sleep center he will need a large mask.    Virtual Visit via Video Note  I connected with Scott [name] on 08/12/23 at  2:30 PM EST by a video enabled telemedicine application and verified that I am speaking with the correct person using two identifiers.  Location: Patient: Home Provider: Office   I discussed the limitations of evaluation and management by telemedicine and the availability of in person appointments. The patient expressed understanding and agreed to proceed.  History of Present Illness: 58 year old male, never smoker followed for sleep apnea on CPAP.  He was last seen for sleep consult 05/02/2023 with Hope NP.  Past medical history significant for insomnia, IBS, hemorrhoids, palpitations.  TESTS/EVENTS: 2006 sleep study: AHI 39.2/h, SpO2 low to 69% 07/15/2023 CPAP titration >> optimal pressure 18 cmH2O with SpO2 low 90%, Large AirFit F20 Full Face  05/02/2023: OV with Hope NP for sleep consult.  History of severe sleep apnea.  Maintained on auto CPAP.  Has daytime sleepiness.  Does have issues with apnea symptoms/gasping.  Feels like pressure is not strong enough.  Download was unavailable for review.  Able to access compliance on air app that showed 100% compliance with average use 8 hours 5 minutes.  No significant air leaks.  Residual AHI 1.27/h.  Does have a history of insomnia.  Was on Lunesta; however, has been off of this recently.  Taking 25 to 50 mg hydroxyzine and 10 mg of melatonin.  Feels like this is working well for him.  Plan for CPAP titration study for further evaluation to reassess pressure settings.  08/12/2023: Today-follow-up Patient presents today for follow-up to review CPAP titration study.  He is optimal pressure setting was 18 cm water.  They also discovered  that he was using the wrong size mask.  He was wearing a medium fullface mask and needed a large. He feels like the mask change has made a huge difference. He doesn't feel like he's having any further issues on the CPAP now. No longer having any apnea/gasping episodes at night. Feeling like his sleep is more restful. No issues with drowsy driving or morning headaches. He's still taking hydroxyzine and melatonin, which work well for him. No concerns or complaints.  07/10/2023-08/08/2023: CPAP 14-20 cmH2O 30/30 days; 100% >4 hr; average use 9 hr 11 min Pressure 95th 16.7 Leaks 95th 1.5 AHI 2  Allergies  Allergen Reactions   Lactose Intolerance (Gi)    Trazodone Hcl     Other reaction(s): rapid heartbeat   Trulicity [Dulaglutide] Other (See Comments)    Weakness - severe    There is no immunization history on file for this patient. Past Medical History:  Diagnosis Date   Anemia    Anxiety    Arthritis    Back pain    pinched nerve sciatica   Diabetes mellitus without complication (HCC)    type  2   Elevated cholesterol with elevated triglycerides    Elevated liver enzymes 2014   normal now   GERD (gastroesophageal reflux disease)    Hypertension    Hypertension    Sleep apnea    uses cpap,setting of 14    Tobacco History: Social History   Tobacco Use  Smoking Status Never  Smokeless Tobacco Never  Counseling given: Not Answered   Outpatient Medications Prior to Visit  Medication Sig Dispense Refill   atenolol-chlorthalidone (TENORETIC) 100-25 MG per tablet Take 1 tablet by mouth daily.     atorvastatin (LIPITOR) 20 MG tablet Take 20 mg by mouth at bedtime.     busPIRone (BUSPAR) 10 MG tablet Take 10 mg by mouth 3 (three) times daily.  0   Dapagliflozin Pro-metFORMIN  ER 05-999 MG TB24 Take 1 tablet by mouth daily at 6 (six) AM.     dorzolamide-timolol (COSOPT) 2-0.5 % ophthalmic solution Place 1 drop into the left eye 2 (two) times daily.     ferrous sulfate 325 (65  FE) MG tablet Take 325 mg by mouth daily.     fluticasone  (FLONASE ) 50 MCG/ACT nasal spray Place 1 spray into both nostrils daily.     gabapentin  (NEURONTIN ) 300 MG capsule Take 300 mg by mouth at bedtime.     glucose blood test strip Use to check blood sugar once a day. 100 each 12   HYDROcodone -acetaminophen  (NORCO) 10-325 MG tablet Take 1 tablet by mouth every 6 (six) hours as needed.     hydrOXYzine (ATARAX/VISTARIL) 25 MG tablet Take 25 mg by mouth at bedtime.  0   levocetirizine (XYZAL) 5 MG tablet Take 5 mg by mouth every evening.     levofloxacin (LEVAQUIN) 500 MG tablet Take 500 mg by mouth daily.     lisinopril (PRINIVIL,ZESTRIL) 2.5 MG tablet Take 5 mg by mouth at bedtime.     LORazepam (ATIVAN) 0.5 MG tablet Take 0.5 mg by mouth 3 (three) times daily as needed for anxiety.     Melatonin 10 MG TABS Take 10 mg by mouth at bedtime.     MICROLET LANCETS MISC Use to check blood sugar. 100 each 2   Multiple Vitamin (MULTIVITAMIN WITH MINERALS) TABS tablet Take 1 tablet by mouth daily. Multivitamin for Adults 50+ (Iron Free)  gummy     Omega-3 Fatty Acids (FISH OIL) 1000 MG CAPS Take 2,000 mg by mouth at bedtime.     omeprazole (PRILOSEC) 40 MG capsule Take 40 mg by mouth 2 (two) times daily.     Semaglutide 14 MG TABS Take 14 mg by mouth daily.     tiZANidine (ZANAFLEX) 4 MG tablet Take 4 mg by mouth at bedtime.     No facility-administered medications prior to visit.     Review of Systems:   Constitutional: No weight loss or gain, night sweats, fevers, chills, fatigue, or lassitude. HEENT: No headaches, difficulty swallowing, tooth/dental problems, or sore throat. No sneezing, itching, ear ache, nasal congestion, or post nasal drip CV:  No chest pain, orthopnea, PND, swelling in lower extremities, anasarca, dizziness, palpitations, syncope Resp: No shortness of breath with exertion or at rest. No excess mucus or change in color of mucus. No productive or non-productive. No  hemoptysis. No wheezing.  No chest wall deformity GI:  No heartburn, indigestion GU: No nocturia  Skin: No rash, lesions, ulcerations Neuro: No dizziness or lightheadedness.  Psych: No depression or anxiety. Mood stable.   Observations/Objective: Patient is well-developed, well-nourished in no acute distress. A&Ox3 Resting comfortably at home.  No labored breathing.  Speech is clear and coherent with logical content.   Assessment and Plan: Severe sleep apnea Severe OSA on CPAP. Previous symptoms of gasping at night and daytime fatigue resolved/improved with mask change. He would prefer to hold off on adjusting his pressures now. He has excellent compliance/control. Receiving benefit from use. Aware  of proper care/use and risks of untreated OSA. Safe driving practices reviewed.   Patient Instructions  Continue to use CPAP every night, minimum of 4-6 hours a night.  Change equipment as directed. Wash your tubing with warm soap and water daily, hang to dry. Wash humidifier portion weekly. Use bottled, distilled water and change daily Be aware of reduced alertness and do not drive or operate heavy machinery if experiencing this or drowsiness.  Exercise encouraged, as tolerated. Healthy weight management discussed.  Avoid or decrease alcohol consumption and medications that make you more sleepy, if possible. Notify if persistent daytime sleepiness occurs even with consistent use of PAP therapy.  Glad the mask change helped. Let us  know if you have any further difficulties   Follow up in one year with Landry or Izetta, NP, or sooner, if needed     I discussed the assessment and treatment plan with the patient. The patient was provided an opportunity to ask questions and all were answered. The patient agreed with the plan and demonstrated an understanding of the instructions.   The patient was advised to call back or seek an in-person evaluation if the symptoms worsen or if the condition fails  to improve as anticipated.  I provided 22 minutes of non-face-to-face time during this encounter.   Comer LULLA Rouleau, NP

## 2023-08-12 NOTE — Assessment & Plan Note (Signed)
 Severe OSA on CPAP. Previous symptoms of gasping at night and daytime fatigue resolved/improved with mask change. He would prefer to hold off on adjusting his pressures now. He has excellent compliance/control. Receiving benefit from use. Aware of proper care/use and risks of untreated OSA. Safe driving practices reviewed.   Patient Instructions  Continue to use CPAP every night, minimum of 4-6 hours a night.  Change equipment as directed. Wash your tubing with warm soap and water daily, hang to dry. Wash humidifier portion weekly. Use bottled, distilled water and change daily Be aware of reduced alertness and do not drive or operate heavy machinery if experiencing this or drowsiness.  Exercise encouraged, as tolerated. Healthy weight management discussed.  Avoid or decrease alcohol consumption and medications that make you more sleepy, if possible. Notify if persistent daytime sleepiness occurs even with consistent use of PAP therapy.  Glad the mask change helped. Let us  know if you have any further difficulties   Follow up in one year with Landry or Izetta, NP, or sooner, if needed

## 2023-08-16 ENCOUNTER — Telehealth: Payer: Self-pay

## 2023-08-16 DIAGNOSIS — R29898 Other symptoms and signs involving the musculoskeletal system: Secondary | ICD-10-CM

## 2023-08-16 DIAGNOSIS — M4802 Spinal stenosis, cervical region: Secondary | ICD-10-CM

## 2023-08-16 DIAGNOSIS — M50123 Cervical disc disorder at C6-C7 level with radiculopathy: Secondary | ICD-10-CM

## 2023-08-16 NOTE — Telephone Encounter (Signed)
 I received a call from [employer] stating that Scott [name] surgery is being denied due to lack of physical therapy. Weakness must be 4- or worse to skip physical therapy. I have discussed this with Dr Claudene. We will refer him to physical therapy until he is discharged.   I have discussed the above with Scott [name]. We will leave his surgery as scheduled for 09/17/23. They will notify me when he is scheduled for his first PT appointment and they will update me after he completes this appointment. If he is discharged soon, we may be able to keep his surgery on schedule for 09/17/23. If not, he is aware that we will need to postpone surgery. I explained that it is up to the physical therapist how many visits he will need.

## 2023-08-19 ENCOUNTER — Encounter: Payer: Self-pay | Admitting: Neurosurgery

## 2023-08-28 ENCOUNTER — Ambulatory Visit
Admission: RE | Admit: 2023-08-28 | Discharge: 2023-08-28 | Disposition: A | Discharge status: Home / Self Care | Payer: 59 | Source: Ambulatory Visit | Attending: Neurosurgery | Admitting: Neurosurgery

## 2023-08-28 ENCOUNTER — Ambulatory Visit
Admission: RE | Admit: 2023-08-28 | Discharge: 2023-08-28 | Disposition: A | Discharge status: Home / Self Care | Payer: 59 | Attending: Neurosurgery | Admitting: Neurosurgery

## 2023-08-28 ENCOUNTER — Encounter: Payer: Self-pay | Admitting: Neurosurgery

## 2023-08-28 ENCOUNTER — Ambulatory Visit: Payer: 59 | Admitting: Neurosurgery

## 2023-08-28 VITALS — BP 122/78 | Ht 74.0 in | Wt 285.8 lb

## 2023-08-28 DIAGNOSIS — M25531 Pain in right wrist: Secondary | ICD-10-CM

## 2023-08-28 DIAGNOSIS — M50123 Cervical disc disorder at C6-C7 level with radiculopathy: Secondary | ICD-10-CM | POA: Diagnosis not present

## 2023-08-28 DIAGNOSIS — R29898 Other symptoms and signs involving the musculoskeletal system: Secondary | ICD-10-CM

## 2023-08-28 DIAGNOSIS — S6991XA Unspecified injury of right wrist, hand and finger(s), initial encounter: Secondary | ICD-10-CM | POA: Diagnosis not present

## 2023-08-28 NOTE — Telephone Encounter (Signed)
Physical therapy evaluation has been rescheduled to 08/30/23

## 2023-08-28 NOTE — Telephone Encounter (Signed)
I spoke with the patient. He reports new hand weakness that started last night. I have added him on for a clinic visit with Dr [name] this morning for evaluation.

## 2023-08-28 NOTE — Progress Notes (Signed)
Referring Physician:  Noberto Retort, MD [phone] [address] [place],  [place] [address]  Primary Physician:  Noberto Retort, MD  History of Present Illness: 08/28/2023 [name] is here today with a chief complaint of severe right hand pain.  Summary of his history is that he has a left-sided cervical radiculopathy with significant weakness for whom we are recommending surgery.  He was required to undergo a physical therapy trial and is starting physical therapy tomorrow.  He is here today for short-term follow-up given severe pain in weakness that happened in his right hand and wrist yesterday evening.  Thankfully he states that this has improved over time but he was having difficulty moving his thumb primarily.  He is not having any new numbness or tingling.  He did not feel weakness anywhere else in his right upper extremity.  The pain was quite severe.  He had no new neck pain.  All of his radicular symptoms continue to be on the left with continued weakness in his left upper extremity.  Review of Systems:  A 10 point review of systems is negative, except for the pertinent positives and negatives detailed in the HPI.  Past Medical History: Past Medical History:  Diagnosis Date   Anemia    Anxiety    Arthritis    Back pain    pinched nerve sciatica   Diabetes mellitus without complication (HCC)    type  2   Elevated cholesterol with elevated triglycerides    Elevated liver enzymes 2014   normal now   GERD (gastroesophageal reflux disease)    Hypertension    Hypertension    Sleep apnea    uses cpap,setting of 14    Past Surgical History: Past Surgical History:  Procedure Laterality Date   BIOPSY  03/12/2023   Procedure: BIOPSY;  Surgeon: Dolores Frame, MD;  [facility]: [facility];  Service: Gastroenterology;;   CATARACT EXTRACTION Left    COLONOSCOPY N/A 09/29/2012   Procedure: COLONOSCOPY;  Surgeon: Charna Elizabeth, MD;  Location: [facility];  Service: Endoscopy;  Laterality: N/A;   COLONOSCOPY WITH PROPOFOL N/A 06/22/2016   Procedure: COLONOSCOPY WITH PROPOFOL;  Surgeon: Jeani Hawking, MD;  Location: [facility];  Service: Endoscopy;  Laterality: N/A;   COLONOSCOPY WITH PROPOFOL N/A 05/20/2020   Procedure: COLONOSCOPY WITH PROPOFOL;  Surgeon: Jeani Hawking, MD;  Location: [facility];  Service: Endoscopy;  Laterality: N/A;   COLONOSCOPY WITH PROPOFOL N/A 03/12/2023   Procedure: COLONOSCOPY WITH PROPOFOL;  Surgeon: Dolores Frame, MD;  Location: [facility];  Service: Gastroenterology;  Laterality: N/A;  11:00am;asa 3, can be done in Room 1 if rescheduled soon 7/15 cy   ENTEROSCOPY N/A 05/20/2020   Procedure: ENTEROSCOPY;  Surgeon: Jeani Hawking, MD;  Location: [facility];  Service: Endoscopy;  Laterality: N/A;   ESOPHAGOGASTRODUODENOSCOPY N/A 09/29/2012   Procedure: ESOPHAGOGASTRODUODENOSCOPY (EGD);  Surgeon: Charna Elizabeth, MD;  Location: [facility];  Service: Endoscopy;  Laterality: N/A;   ESOPHAGOGASTRODUODENOSCOPY (EGD) WITH PROPOFOL N/A 06/22/2016   Procedure: ESOPHAGOGASTRODUODENOSCOPY (EGD) WITH PROPOFOL;  Surgeon: Jeani Hawking, MD;  Location: [facility];  Service: Endoscopy;  Laterality: N/A;   FRACTURE SURGERY     right wrist with screws   HERNIA REPAIR  2008   inguinal   KNEE ARTHROSCOPY Left 06/24/2020   Procedure: ARTHROSCOPY KNEE WITH PARTIAL MEDIAL MENISCECTOMY, CHONDROPLASTY,  OPEN CYSTECTOMY;  Surgeon: Jodi Geralds, MD;  Location: [facility];  Service: Orthopedics;  Laterality: Left;  Allergies: Allergies as of 08/28/2023 - Review Complete 08/28/2023  Allergen Reaction Noted   Lactose intolerance (gi)  10/01/2022   Trazodone hcl  07/05/2021   Trulicity [dulaglutide] Other (See Comments) 07/02/2016    Medications: Outpatient Encounter Medications as of 08/28/2023  Medication Sig   atenolol-chlorthalidone (TENORETIC) 100-25 MG per tablet Take 1 tablet by mouth daily.   atorvastatin  (LIPITOR) 20 MG tablet Take 20 mg by mouth at bedtime.   busPIRone (BUSPAR) 10 MG tablet Take 10 mg by mouth 3 (three) times daily.   dorzolamide-timolol (COSOPT) 2-0.5 % ophthalmic solution Place 1 drop into the left eye 2 (two) times daily.   fluticasone (FLONASE) 50 MCG/ACT nasal spray Place 1 spray into both nostrils daily.   gabapentin (NEURONTIN) 300 MG capsule Take 300 mg by mouth 3 (three) times daily.   glucose blood test strip Use to check blood sugar once a day.   HYDROcodone-acetaminophen (NORCO) 10-325 MG tablet Take 1 tablet by mouth every 6 (six) hours as needed for severe pain (pain score 7-10).   hydrOXYzine (ATARAX) 50 MG tablet Take 50 mg by mouth at bedtime.   levocetirizine (XYZAL) 5 MG tablet Take 5 mg by mouth every evening.   lisinopril (ZESTRIL) 5 MG tablet Take 5 mg by mouth at bedtime.   LORazepam (ATIVAN) 0.5 MG tablet Take 0.5 mg by mouth 2 (two) times daily as needed for anxiety.   Melatonin 10 MG CHEW Chew 10 mg by mouth at bedtime.   MICROLET LANCETS MISC Use to check blood sugar.   Multiple Vitamin (MULTIVITAMIN WITH MINERALS) TABS tablet Take 1 tablet by mouth daily. Multivitamin for Adults 50+ (Iron Free)  gummy   Omega-3 Fatty Acids (FISH OIL) 1000 MG CAPS Take 1,000 mg by mouth at bedtime.   omeprazole (PRILOSEC) 40 MG capsule Take 40 mg by mouth 2 (two) times daily.   Semaglutide 14 MG TABS Take 14 mg by mouth daily.   tiZANidine (ZANAFLEX) 4 MG tablet Take 4 mg by mouth at bedtime.   [DISCONTINUED] Dapagliflozin Pro-metFORMIN ER 05-999 MG TB24 Take 1 tablet by mouth daily at 6 (six) AM.   [DISCONTINUED] ferrous sulfate 325 (65 FE) MG tablet Take 325 mg by mouth daily.   [DISCONTINUED] levofloxacin (LEVAQUIN) 500 MG tablet Take 500 mg by mouth daily.   [DISCONTINUED] liraglutide (VICTOZA) 18 MG/3ML SOPN Inject 1.2 mg into the skin every evening.   No facility-administered encounter medications on file as of 08/28/2023.    Social History: Social  History   Tobacco Use   Smoking status: Never   Smokeless tobacco: Never  Vaping Use   Vaping status: Never Used  Substance Use Topics   Alcohol use: Yes    Comment: rarely   Drug use: Not Currently    Types: Marijuana    Comment: rare marijuana use only last used 3 months ago    Family Medical History: Family History  Problem Relation Age of Onset   Diabetes Mother    Hypertension Mother    Hyperlipidemia Mother    Cancer Father    Thyroid disease Sister     Physical Examination: General: Patient is well developed, well nourished, calm, collected, and in no apparent distress. Attention to examination is appropriate.  Psychiatric: Patient is non-anxious.  Head:  Pupils equal, round, and reactive to light.  ENT:  Oral mucosa appears well hydrated.  Neck:   Supple.  Decreased range of motion.  Respiratory: Patient is breathing without any difficulty.  Extremities: No  edema.  Skin:   On exposed skin, there are no abnormal skin lesions.  NEUROLOGICAL:     Awake, alert, oriented to person, place, and time.  Speech is clear and fluent. Fund of knowledge is appropriate.   Cranial Nerves: Pupils equal round and reactive to light.  Facial tone is symmetric.  ROM of spine: Decreased in cervical spine.    +Spurlings.  Strength: Side Biceps Triceps Deltoid Interossei Grip Wrist Ext. Wrist Flex.  R 5 5 5 5 5 5 5   L 5 4 5 5 5 4 5   Strength on the right was tested and intact, he did have some pain limitation also had severe pain and tenderness at the base of his right carpal metacarpal joint  Reflexes are 2+ in the right biceps, triceps, brachioradialis. 1+ in left triceps, 2+ in left biceps and BR.  Hoffman's is absent.  Decreased sensation in left upper extremity compared to the right. Gait is normal.   No difficulty with tandem gait.   No evidence of dysmetria noted.  Medical Decision Making  Imaging: MRI Cervical spine: IMPRESSION: 1. C6-C7 severe left neural  foraminal narrowing. 2. C3-C4 mild left neural foraminal narrowing. 3. C4-C5 and C5-C6 mild right neural foraminal narrowing. 4. C7-T1 mild left neural foraminal narrowing. 5. No spinal canal stenosis   I have personally reviewed the images and agree with the above interpretation.  Assessment and Plan: [name] is a pleasant 58 y.o. male is here today with a chief complaint of acute onset of right hand weakness and pain.  Thankfully this has improved significantly overnight.  He states that he was having difficulty moving mostly his thumb.  No new numbness or tingling.  No new neck pain.  No new radiation down his arm.  He is very tender to palpation at the base of his carpal metacarpal joint on the lateral aspect of his wrist.  Will plan to get him x-rays to make sure he did not have any trauma, he did recently start a lawn more.  In regards to his left upper extremity pain and weakness, continues to have symptoms related to his cervical radiculopathy.  He is starting physical therapy tomorrow.  Should he fail therapy or not have any improvement with a regimen, will continue to plan for cervical decompression as initially recommended.  Thank you for involving me in the care of this patient.  Lovenia Kim, MD Dept. of Neurosurgery    dure.

## 2023-08-29 ENCOUNTER — Telehealth: Payer: Self-pay

## 2023-08-29 DIAGNOSIS — M50123 Cervical disc disorder at C6-C7 level with radiculopathy: Secondary | ICD-10-CM | POA: Diagnosis not present

## 2023-08-29 DIAGNOSIS — R29898 Other symptoms and signs involving the musculoskeletal system: Secondary | ICD-10-CM | POA: Diagnosis not present

## 2023-08-29 DIAGNOSIS — M4802 Spinal stenosis, cervical region: Secondary | ICD-10-CM | POA: Diagnosis not present

## 2023-08-29 DIAGNOSIS — M6281 Muscle weakness (generalized): Secondary | ICD-10-CM | POA: Diagnosis not present

## 2023-08-29 NOTE — Telephone Encounter (Signed)
Peer to peer scheduled for 08/29/23 between 3-5pm

## 2023-08-30 NOTE — Telephone Encounter (Signed)
Surgery denial upheld. See telephone call from 08/29/23

## 2023-08-31 ENCOUNTER — Ambulatory Visit
Admission: RE | Admit: 2023-08-31 | Discharge: 2023-08-31 | Disposition: A | Discharge status: Home / Self Care | Payer: 59 | Source: Ambulatory Visit | Attending: Family Medicine | Admitting: Family Medicine

## 2023-08-31 ENCOUNTER — Ambulatory Visit (INDEPENDENT_AMBULATORY_CARE_PROVIDER_SITE_OTHER): Payer: 59

## 2023-08-31 ENCOUNTER — Telehealth: Payer: Self-pay

## 2023-08-31 VITALS — BP 113/75 | HR 78 | Temp 99.2°F | Resp 20

## 2023-08-31 DIAGNOSIS — M25531 Pain in right wrist: Secondary | ICD-10-CM

## 2023-08-31 NOTE — Telephone Encounter (Signed)
Called pt to inform him of his x ray results , per provider. Pt was informed to return to urgent care to get a splint placed on his right wrist in case of the mild fracture that appeared on his x ray. Pt aggreed to return to the UC to have stent placed.

## 2023-08-31 NOTE — ED Triage Notes (Signed)
Pt reports he was lifting things into his truck and injured his right wrist to thumb area x 1 week. States it is painful to lift or open small things.

## 2023-08-31 NOTE — Discharge Instructions (Signed)
Rest, ice, elevation, compression, over the counter pain relievers, muscle relaxers, massage.  We will let you know if the x-ray comes back abnormal

## 2023-08-31 NOTE — ED Notes (Signed)
Pt returned to urgent care to have a splint placed to his right wrist Explained the radial gutters purpose, after clinic care, and when to see ortho to patient. He verbalized understanding.

## 2023-09-02 ENCOUNTER — Encounter: Payer: Self-pay | Admitting: Orthopedic Surgery

## 2023-09-02 ENCOUNTER — Ambulatory Visit: Payer: 59 | Admitting: Orthopedic Surgery

## 2023-09-02 VITALS — BP 120/79 | HR 80 | Ht 74.0 in | Wt 289.0 lb

## 2023-09-02 DIAGNOSIS — M654 Radial styloid tenosynovitis [de Quervain]: Secondary | ICD-10-CM | POA: Diagnosis not present

## 2023-09-02 DIAGNOSIS — D509 Iron deficiency anemia, unspecified: Secondary | ICD-10-CM | POA: Insufficient documentation

## 2023-09-02 DIAGNOSIS — K219 Gastro-esophageal reflux disease without esophagitis: Secondary | ICD-10-CM | POA: Insufficient documentation

## 2023-09-02 DIAGNOSIS — K5730 Diverticulosis of large intestine without perforation or abscess without bleeding: Secondary | ICD-10-CM | POA: Insufficient documentation

## 2023-09-02 DIAGNOSIS — M7712 Lateral epicondylitis, left elbow: Secondary | ICD-10-CM | POA: Diagnosis not present

## 2023-09-02 DIAGNOSIS — R7989 Other specified abnormal findings of blood chemistry: Secondary | ICD-10-CM | POA: Insufficient documentation

## 2023-09-02 DIAGNOSIS — R194 Change in bowel habit: Secondary | ICD-10-CM | POA: Insufficient documentation

## 2023-09-02 DIAGNOSIS — K601 Chronic anal fissure: Secondary | ICD-10-CM | POA: Insufficient documentation

## 2023-09-02 DIAGNOSIS — K640 First degree hemorrhoids: Secondary | ICD-10-CM | POA: Insufficient documentation

## 2023-09-02 MED ORDER — METHYLPREDNISOLONE ACETATE 40 MG/ML IJ SUSP
40.0000 mg | Freq: Once | INTRAMUSCULAR | Status: AC
Start: 2023-09-02 — End: 2023-09-02
  Administered 2023-09-02: 40 mg via INTRA_ARTICULAR

## 2023-09-02 NOTE — Progress Notes (Signed)
  Intake history:  There were no vitals taken for this visit. There is no height or weight on file to calculate BMI.    WHAT ARE WE SEEING YOU FOR TODAY?   right wrist(s)  How long has this bothered you? (DOI?DOS?WS?)  2 week(s) ago  Anticoag.  No  Diabetes yes  Heart disease No  Hypertension Yes  SMOKING HX No  Kidney disease No  Any ALLERGIES ______________________________________________   Treatment:  Have you taken:  Tylenol Yes  Advil No  Had PT No  Had injection No  Other  _________________________

## 2023-09-02 NOTE — Patient Instructions (Addendum)
Wear brace  Ice today and tomorrow

## 2023-09-02 NOTE — Progress Notes (Signed)
Patient ID: Scott [name], male   DOB: August 05, 1966, 58 y.o.   MRN: [id number]    Intake history:  There were no vitals taken for this visit. There is no height or weight on file to calculate BMI.    WHAT ARE WE SEEING YOU FOR TODAY?   right wrist(s)  How long has this bothered you? (DOI?DOS?WS?)  2 week(s) ago  Anticoag.  No  Diabetes yes  Heart disease No  Hypertension Yes  SMOKING HX No  Kidney disease No  Any ALLERGIES ______________________________________________   Treatment:  Have you taken:  Tylenol Yes  Advil No  Had PT No  Had injection No  Other  _________________________

## 2023-09-02 NOTE — Progress Notes (Signed)
  Subjective:     Patient ID: Scott [name], male   DOB: 1965/11/04, 58 y.o.   MRN: [id number]  No chief complaint on file.   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  X-ray showed a possible fracture he was treated with a splint but his pain is not over the area in question on x-ray it is over the first extensor compartment  He also complains of pain over his left elbow  He has a history of cervical disc disease has been scheduled for cervical discectomy with left upper extremity paresthesias and radiculopathy  Wrist Pain  Pertinent negatives include no fever.     Review of Systems  Constitutional:  Negative for activity change, fatigue and fever.  Musculoskeletal:        Left elbow pain       Objective:   Physical Exam Right upper extremity tenderness over the first extensor compartment positive Finkelstein's test intact thumb extensor tendon function including IP and MP joint  Point tenderness over the left elbow with pain with wrist extension especially against resistance consistent with lateral epicondylitis    Assessment:     Encounter Diagnoses  Name Primary?   De Quervain's tenosynovitis, right Yes   Lateral epicondylitis, left elbow    Imaging shows an ossification over the left wrist no evidence of fracture patient has history of previous surgery which may be accounting for the ossification    Plan:     Inject first extensor compartment Right thumb splint Left tennis elbow splint  Follow-up in 4 weeks  Procedure note injection RIGHT wrist for de Quervain's syndrome  The patient has consented for injection of THE RIGHT wrist for de Quervain's syndrome 40 mg of Depo-Medrol 1 mL, 3 mL 1% lidocaine. Patient gave verbal consent timeout to confirm site of injection  Sterile technique ethyl chloride used for skin prep  No complications

## 2023-09-03 ENCOUNTER — Telehealth: Payer: Self-pay | Admitting: Orthopedic Surgery

## 2023-09-03 ENCOUNTER — Telehealth: Payer: Self-pay

## 2023-09-03 DIAGNOSIS — N62 Hypertrophy of breast: Secondary | ICD-10-CM

## 2023-09-03 NOTE — Telephone Encounter (Signed)
Explained takes few days sometimes for the depomedrol to work, and normal for it to hurt once the Lidocaine wears off, told him to rest ice and it should feel better soon when the depomedrol starts to work.

## 2023-09-03 NOTE — Telephone Encounter (Signed)
-----   Message from Lovenia Kim sent at 09/03/2023 10:55 AM EST ----- Regarding: RE: plastic surgeon Foster Simpson is the plastics person we work with most closely ----- Message ----- From: Sharlot Gowda, RN Sent: 09/03/2023  10:15 AM EST To: Lovenia Kim, MD Subject: plastic surgeon                                This message is buried in all of the other mychart messages, but he asked about a referral to a plastic surgeon.  "Dr. [name] had said he would refer me to a plastic surgeon for a possible breast reduction that is contributing to my neck and back pain. Has that referral been done or do we need to wait until the surgery is completed on my neck to start that process?"

## 2023-09-03 NOTE — Telephone Encounter (Signed)
Dr. Mort Sawyers pt - spoke w/the pt, pt was seen yesterday, he stated his whole hand is in so much pain including the injection site.  He stated that it's swollen and he can't move fingers due to the pain.  Please advise.  [phone]

## 2023-09-04 ENCOUNTER — Other Ambulatory Visit: Payer: 59

## 2023-09-04 NOTE — ED Provider Notes (Signed)
[facility]    CSN: [id number] Arrival date & time: 08/31/23  0902      History   Chief Complaint Chief Complaint  Patient presents with   Hand Problem    Entered by patient    HPI Scott [name] is a 58 y.o. male.   Presenting today with 1 week history of pain to the right wrist and base of thumb.  No known injury but states he does a lot of repetitive motion with the wrist in this area.  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  Denies swelling, redness, numbness, tingling, complete loss of range of motion.  Trying over-the-counter pain relievers and wrist brace with minimal relief.    Past Medical History:  Diagnosis Date   Anemia    Anxiety    Arthritis    Back pain    pinched nerve sciatica   Diabetes mellitus without complication (HCC)    type  2   Elevated cholesterol with elevated triglycerides    Elevated liver enzymes 2014   normal now   GERD (gastroesophageal reflux disease)    Hypertension    Hypertension    Sleep apnea    uses cpap,setting of 14    Patient Active Problem List   Diagnosis Date Noted   Abnormal liver function tests 09/02/2023   Change in bowel habit 09/02/2023   Chronic anal fissure 09/02/2023   Diverticular disease of colon 09/02/2023   First degree hemorrhoids 09/02/2023   Gastro-esophageal reflux disease without esophagitis 09/02/2023   Iron deficiency anemia 09/02/2023   Morbid obesity (HCC) 09/02/2023   Right wrist pain 08/28/2023   Cervical disc disorder at C6-C7 level with radiculopathy 08/12/2023   Spinal stenosis in cervical region 08/12/2023   Left arm weakness 08/12/2023   Severe sleep apnea 05/02/2023   Insomnia 05/02/2023   Hemorrhoids 04/22/2023   IBS (irritable bowel syndrome) 12/24/2022   Palpitations 02/21/2016    Past Surgical History:  Procedure Laterality Date   BIOPSY  03/12/2023   Procedure: BIOPSY;  Surgeon:  Dolores Frame, MD;  [facility]: [facility];  Service: Gastroenterology;;   CATARACT EXTRACTION Left    COLONOSCOPY N/A 09/29/2012   Procedure: COLONOSCOPY;  Surgeon: Charna Elizabeth, MD;  Location: [facility];  Service: Endoscopy;  Laterality: N/A;   COLONOSCOPY WITH PROPOFOL N/A 06/22/2016   Procedure: COLONOSCOPY WITH PROPOFOL;  Surgeon: Jeani Hawking, MD;  Location: [facility];  Service: Endoscopy;  Laterality: N/A;   COLONOSCOPY WITH PROPOFOL N/A 05/20/2020   Procedure: COLONOSCOPY WITH PROPOFOL;  Surgeon: Jeani Hawking, MD;  [facility]: [facility];  Service: Endoscopy;  Laterality: N/A;   COLONOSCOPY WITH PROPOFOL N/A 03/12/2023   Procedure: COLONOSCOPY WITH PROPOFOL;  Surgeon: Dolores Frame, MD;  [facility]: [facility];  Service: Gastroenterology;  Laterality: N/A;  11:00am;asa 3, can be done in Room 1 if rescheduled soon 7/15 cy   ENTEROSCOPY N/A 05/20/2020   Procedure: ENTEROSCOPY;  Surgeon: Jeani Hawking, MD;  [facility]: [facility];  Service: Endoscopy;  Laterality: N/A;   ESOPHAGOGASTRODUODENOSCOPY N/A 09/29/2012   Procedure: ESOPHAGOGASTRODUODENOSCOPY (EGD);  Surgeon: Charna Elizabeth, MD;  [facility]: [facility];  Service: Endoscopy;  Laterality: N/A;   ESOPHAGOGASTRODUODENOSCOPY (EGD) WITH PROPOFOL N/A 06/22/2016   Procedure: ESOPHAGOGASTRODUODENOSCOPY (EGD) WITH PROPOFOL;  Surgeon: Jeani Hawking, MD;  Location: [facility];  Service: Endoscopy;  Laterality: N/A;   FRACTURE SURGERY     right wrist with screws   HERNIA REPAIR  2008   inguinal   KNEE ARTHROSCOPY Left 06/24/2020   Procedure: ARTHROSCOPY KNEE WITH PARTIAL MEDIAL MENISCECTOMY, CHONDROPLASTY,  OPEN CYSTECTOMY;  Surgeon: Jodi Geralds, MD;  Location: [facility];  Service: Orthopedics;  Laterality: Left;       Home Medications    Prior to Admission medications   Medication Sig Start Date End Date Taking? Authorizing Provider  atenolol-chlorthalidone (TENORETIC) 100-25 MG per tablet Take 1 tablet by  mouth daily.    [provider]  atorvastatin (LIPITOR) 20 MG tablet Take 20 mg by mouth at bedtime.    [provider]  busPIRone (BUSPAR) 10 MG tablet Take 10 mg by mouth 3 (three) times daily. 07/07/15   [provider]  Dapagliflozin Pro-metFORMIN ER (XIGDUO XR) 05-999 MG TB24 Take 1 tablet by mouth daily.    [provider]  dorzolamide-timolol (COSOPT) 2-0.5 % ophthalmic solution Place 1 drop into the left eye 2 (two) times daily.    [provider]  Ferrous Sulfate (IRON) 28 MG TABS Take 28 mg by mouth daily.    [provider]  fluticasone (FLONASE) 50 MCG/ACT nasal spray Place 1 spray into both nostrils daily. 07/09/23   [provider]  gabapentin (NEURONTIN) 300 MG capsule Take 300 mg by mouth 3 (three) times daily.    [provider]  glucose blood test strip Use to check blood sugar once a day. 07/02/16   Carlus Pavlov, MD  HYDROcodone-acetaminophen (NORCO) 10-325 MG tablet Take 1 tablet by mouth every 6 (six) hours as needed for severe pain (pain score 7-10). 05/28/23   [provider]  hydrOXYzine (ATARAX) 50 MG tablet Take 50 mg by mouth at bedtime. 07/04/15   [provider]  ibuprofen (ADVIL) 800 MG tablet Take 800 mg by mouth every 8 (eight) hours as needed for moderate pain (pain score 4-6).    [provider]  levocetirizine (XYZAL) 5 MG tablet Take 5 mg by mouth every evening.    [provider]  lisinopril (ZESTRIL) 5 MG tablet Take 5 mg by mouth at bedtime.    [provider]  LORazepam (ATIVAN) 0.5 MG tablet Take 0.5 mg by mouth 2 (two) times daily as needed for anxiety.    [provider]  magnesium oxide (MAG-OX) 400 (240 Mg) MG tablet Take 400 mg by mouth at bedtime.    [provider]  Melatonin 10 MG CHEW Chew 10 mg by mouth at bedtime.    [provider]  MICROLET LANCETS MISC Use to check blood sugar. 07/02/16   Carlus Pavlov, MD  Misc Natural Products (IMMUNE BOOST PO) Take 2 each by mouth daily.    [provider]  Multiple Vitamin (MULTIVITAMIN WITH MINERALS) TABS tablet Take 1 tablet by mouth daily. Multivitamin for Adults 50+ (Iron Free)  gummy    [provider]  Omega-3 Fatty Acids (FISH OIL) 1000 MG CAPS Take 1,000 mg by mouth at bedtime.    [provider]  omeprazole (PRILOSEC) 40 MG capsule Take 40 mg by mouth 2 (two) times daily. 05/09/20   [provider]  Semaglutide 14 MG TABS Take 14 mg by mouth daily. 04/29/20   [provider]  tiZANidine (ZANAFLEX) 4 MG tablet Take 4 mg by mouth at bedtime. 04/29/20   [provider]  liraglutide (VICTOZA) 18 MG/3ML SOPN Inject 1.2 mg into the skin every evening.  11/02/19  [provider]    Family History Family History  Problem Relation  Age of Onset   Diabetes Mother    Hypertension Mother    Hyperlipidemia Mother    Cancer Father    Thyroid disease Sister     Social History Social History   Tobacco Use   Smoking status: Never   Smokeless tobacco: Never  Vaping Use   Vaping status: Never Used  Substance Use Topics   Alcohol use: Yes    Comment: rarely   Drug use: Not Currently    Types: Marijuana    Comment: rare marijuana use only last used 3 months ago     Allergies   Lactose intolerance (gi), Trazodone hcl, and Trulicity [dulaglutide]   Review of Systems Review of Systems Per HPI  Physical Exam Triage Vital Signs ED Triage Vitals  Encounter Vitals Group     BP 08/31/23 0924 113/75     Systolic BP Percentile --      Diastolic BP Percentile --      Pulse Rate 08/31/23 0924 78     Resp 08/31/23 0924 20     Temp 08/31/23 0924 99.2 F (37.3 C)     Temp Source 08/31/23 0924 Oral     SpO2 08/31/23 0924 98 %     Weight --      Height --      Head Circumference --      Peak Flow --      Pain Score 08/31/23 0925 5     Pain Loc --      Pain Education --       Exclude from Growth Chart --    No data found.  Updated Vital Signs BP 113/75 (BP Location: Right Arm)   Pulse 78   Temp 99.2 F (37.3 C) (Oral)   Resp 20   SpO2 98%   Visual Acuity Right Eye Distance:   Left Eye Distance:   Bilateral Distance:    Right Eye Near:   Left Eye Near:    Bilateral Near:     Physical Exam Vitals and nursing note reviewed.  Constitutional:      Appearance: Normal appearance.  HENT:     Head: Atraumatic.  Eyes:     Extraocular Movements: Extraocular movements intact.     Conjunctiva/sclera: Conjunctivae normal.  Cardiovascular:     Rate and Rhythm: Normal rate and regular rhythm.  Pulmonary:     Effort: Pulmonary effort is normal.     Breath sounds: Normal breath sounds.  Musculoskeletal:        General: Tenderness present. No swelling or deformity. Normal range of motion.     Cervical back: Normal range of motion and neck supple.     Comments: Tender to palpation to the radial aspect of the right wrist, positive Finkelstein test  Skin:    General: Skin is warm and dry.  Neurological:     General: No focal deficit present.     Mental Status: He is oriented to person, place, and time.  Psychiatric:        Mood and Affect: Mood normal.        Thought Content: Thought content normal.        Judgment: Judgment normal.      UC Treatments / Results  Labs (all labs ordered are listed, but only abnormal results are displayed) Labs Reviewed - No data to display  EKG   Radiology No results found.  Procedures Procedures (including critical care time)  Medications Ordered in UC Medications - No data to display  Initial Impression / Assessment and Plan / UC Course  I have reviewed the triage vital signs and the nursing notes.  Pertinent labs & imaging results that were available during my care of the patient were reviewed by me and considered in my medical decision making (see chart for details).     Exam most suspicious for de  Quervain's tenosynovitis, however x-ray of the right wrist today showing a possible fracture to the distal radius.  Will place in a radial gutter splint and await orthopedic follow-up for further evaluation and management.  Discussed RICE protocol, over-the-counter pain relievers additionally.  Final Clinical Impressions(s) / UC Diagnoses   Final diagnoses:  Right wrist pain     Discharge Instructions      Rest, ice, elevation, compression, over the counter pain relievers, muscle relaxers, massage.  We will let you know if the x-ray comes back abnormal    ED Prescriptions   None    PDMP not reviewed this encounter.   Particia Nearing, [place] 09/04/23 [phone]

## 2023-09-11 DIAGNOSIS — M4802 Spinal stenosis, cervical region: Secondary | ICD-10-CM | POA: Diagnosis not present

## 2023-09-11 DIAGNOSIS — R29898 Other symptoms and signs involving the musculoskeletal system: Secondary | ICD-10-CM | POA: Diagnosis not present

## 2023-09-11 DIAGNOSIS — M6281 Muscle weakness (generalized): Secondary | ICD-10-CM | POA: Diagnosis not present

## 2023-09-11 DIAGNOSIS — M50123 Cervical disc disorder at C6-C7 level with radiculopathy: Secondary | ICD-10-CM | POA: Diagnosis not present

## 2023-09-13 NOTE — Progress Notes (Signed)
  Intake history:  There were no vitals taken for this visit. There is no height or weight on file to calculate BMI.    WHAT ARE WE SEEING YOU FOR TODAY?   left elbow  How long has this bothered you? (DOI?DOS?WS?)   Anticoag.  No  Diabetes yes  Heart disease No  Hypertension Yes  SMOKING HX No  Kidney disease No  Any ALLERGIES ______________________________________________   Treatment:  Have you taken:  Tylenol  Yes  Advil  Yes  Had PT No  Had injection No  Other  _________________________

## 2023-09-16 ENCOUNTER — Encounter: Payer: Self-pay | Admitting: Orthopedic Surgery

## 2023-09-16 ENCOUNTER — Ambulatory Visit: Payer: 59 | Admitting: Orthopedic Surgery

## 2023-09-16 VITALS — BP 136/75 | HR 73 | Ht 74.0 in | Wt 289.0 lb

## 2023-09-16 DIAGNOSIS — M7712 Lateral epicondylitis, left elbow: Secondary | ICD-10-CM

## 2023-09-16 MED ORDER — METHYLPREDNISOLONE ACETATE 40 MG/ML IJ SUSP
40.0000 mg | Freq: Once | INTRAMUSCULAR | Status: AC
Start: 2023-09-16 — End: 2023-09-16
  Administered 2023-09-16: 40 mg via INTRA_ARTICULAR

## 2023-09-16 NOTE — Progress Notes (Signed)
 Patient ID: Scott [name], male   DOB: 1966-07-08, 58 y.o.   MRN: [id number]  58 year old male we saw last week for tennis elbow.  He started wearing his tennis elbow brace he says that is helping  He presents now for planned injection left elbow  Procedure note injection for left tennis elbow  Diagnosis left tennis elbow  Anesthesia ethyl chloride was used Alcohol use is clean the skin  After we obtained verbal consent and timeout a 25-gauge needle was used to inject 40 mg of Depo-Medrol  and 3 cc of 1% lidocaine  just distal to the insertion of the ECRB  There were no complications and a sterile bandage was applied.

## 2023-09-17 ENCOUNTER — Ambulatory Visit: Admit: 2023-09-17 | Payer: 59 | Admitting: Neurosurgery

## 2023-09-17 SURGERY — FORAMINOTOMY 1 LEVEL
Anesthesia: General | Laterality: Left

## 2023-09-23 ENCOUNTER — Ambulatory Visit: Payer: 59 | Admitting: Neurosurgery

## 2023-09-23 VITALS — BP 126/84 | Ht 74.0 in | Wt 280.6 lb

## 2023-09-23 DIAGNOSIS — R29898 Other symptoms and signs involving the musculoskeletal system: Secondary | ICD-10-CM

## 2023-09-23 DIAGNOSIS — M4802 Spinal stenosis, cervical region: Secondary | ICD-10-CM

## 2023-09-23 DIAGNOSIS — M50123 Cervical disc disorder at C6-C7 level with radiculopathy: Secondary | ICD-10-CM

## 2023-09-23 MED ORDER — GABAPENTIN 300 MG PO CAPS: 300.0000 mg | ORAL_CAPSULE | Freq: Two times a day (BID) | ORAL | 1 refills | Status: AC

## 2023-09-23 NOTE — Progress Notes (Signed)
Referring Physician:  Noberto Retort, MD [phone] [address] [place],  [place] [address]  Primary Physician:  Noberto Retort, MD  History of Present Illness: 09/23/2023 Scott [name] is here today with a known cervical radiculopathy.  He has been being treated with conservative measures including physical therapy and observation.  He continues to have left-sided arm weakness.  He has had some other coinciding issues including right hand pain as well as left forearm pain.  These are separate from his radiculopathy and is being treated by orthopedist.  He is currently been through 5 weeks of physical therapy.  He continues to have significant neck pain and radiation into his C7 distribution with weakness in his left tricep and decreased reflex   review of Systems:  A 10 point review of systems is negative, except for the pertinent positives and negatives detailed in the HPI.  Past Medical History: Past Medical History:  Diagnosis Date   Anemia    Anxiety    Arthritis    Back pain    pinched nerve sciatica   Diabetes mellitus without complication (HCC)    type  2   Elevated cholesterol with elevated triglycerides    Elevated liver enzymes 2014   normal now   GERD (gastroesophageal reflux disease)    Hypertension    Hypertension    Sleep apnea    uses cpap,setting of 14    Past Surgical History: Past Surgical History:  Procedure Laterality Date   BIOPSY  03/12/2023   Procedure: BIOPSY;  Surgeon: Dolores Frame, MD;  [facility]: [facility];  Service: Gastroenterology;;   CATARACT EXTRACTION Left    COLONOSCOPY N/A 09/29/2012   Procedure: COLONOSCOPY;  Surgeon: Charna Elizabeth, MD;  [facility];  Service: Endoscopy;  Laterality: N/A;   COLONOSCOPY WITH PROPOFOL N/A 06/22/2016   Procedure: COLONOSCOPY WITH PROPOFOL;  Surgeon: Jeani Hawking, MD;  [facility];  Service: Endoscopy;  Laterality: N/A;   COLONOSCOPY WITH PROPOFOL N/A  05/20/2020   Procedure: COLONOSCOPY WITH PROPOFOL;  Surgeon: Jeani Hawking, MD;  [facility];  Service: Endoscopy;  Laterality: N/A;   COLONOSCOPY WITH PROPOFOL N/A 03/12/2023   Procedure: COLONOSCOPY WITH PROPOFOL;  Surgeon: Dolores Frame, MD;  [facility];  Service: Gastroenterology;  Laterality: N/A;  11:00am;asa 3, can be done in Room 1 if rescheduled soon 7/15 cy   ENTEROSCOPY N/A 05/20/2020   Procedure: ENTEROSCOPY;  Surgeon: Jeani Hawking, MD;  [facility];  Service: Endoscopy;  Laterality: N/A;   ESOPHAGOGASTRODUODENOSCOPY N/A 09/29/2012   Procedure: ESOPHAGOGASTRODUODENOSCOPY (EGD);  Surgeon: Charna Elizabeth, MD;  [facility];  Service: Endoscopy;  Laterality: N/A;   ESOPHAGOGASTRODUODENOSCOPY (EGD) WITH PROPOFOL N/A 06/22/2016   Procedure: ESOPHAGOGASTRODUODENOSCOPY (EGD) WITH PROPOFOL;  Surgeon: Jeani Hawking, MD;  [facility];  Service: Endoscopy;  Laterality: N/A;   FRACTURE SURGERY     right wrist with screws   HERNIA REPAIR  2008   inguinal   KNEE ARTHROSCOPY Left 06/24/2020   Procedure: ARTHROSCOPY KNEE WITH PARTIAL MEDIAL MENISCECTOMY, CHONDROPLASTY,  OPEN CYSTECTOMY;  Surgeon: Jodi Geralds, MD;  Location: [facility];  Service: Orthopedics;  Laterality: Left;    Allergies: Allergies as of 09/23/2023 - Review Complete 09/23/2023  Allergen Reaction Noted   Lactose intolerance (gi)  10/01/2022   Trazodone hcl  07/05/2021   Trulicity [dulaglutide] Other (See Comments) 07/02/2016    Medications: Outpatient Encounter Medications as of 09/23/2023  Medication Sig   atenolol-chlorthalidone (TENORETIC) 100-25  MG per tablet Take 1 tablet by mouth daily.   atorvastatin (LIPITOR) 20 MG tablet Take 20 mg by mouth at bedtime.   busPIRone (BUSPAR) 10 MG tablet Take 10 mg by mouth 3 (three) times daily.   Dapagliflozin Pro-metFORMIN ER (XIGDUO XR) 05-999 MG TB24 Take 1 tablet by mouth daily.   dorzolamide-timolol (COSOPT) 2-0.5  % ophthalmic solution Place 1 drop into the left eye 2 (two) times daily.   Ferrous Sulfate (IRON) 28 MG TABS Take 28 mg by mouth daily.   fluticasone (FLONASE) 50 MCG/ACT nasal spray Place 1 spray into both nostrils daily.   glucose blood test strip Use to check blood sugar once a day.   HYDROcodone-acetaminophen (NORCO) 10-325 MG tablet Take 1 tablet by mouth every 6 (six) hours as needed for severe pain (pain score 7-10).   hydrOXYzine (ATARAX) 50 MG tablet Take 50 mg by mouth at bedtime.   ibuprofen (ADVIL) 800 MG tablet Take 800 mg by mouth every 8 (eight) hours as needed for moderate pain (pain score 4-6).   levocetirizine (XYZAL) 5 MG tablet Take 5 mg by mouth every evening.   lisinopril (ZESTRIL) 5 MG tablet Take 5 mg by mouth at bedtime.   LORazepam (ATIVAN) 0.5 MG tablet Take 0.5 mg by mouth 2 (two) times daily as needed for anxiety.   magnesium oxide (MAG-OX) 400 (240 Mg) MG tablet Take 400 mg by mouth at bedtime.   Melatonin 10 MG CHEW Chew 10 mg by mouth at bedtime.   MICROLET LANCETS MISC Use to check blood sugar.   Misc Natural Products (IMMUNE BOOST PO) Take 2 each by mouth daily.   Multiple Vitamin (MULTIVITAMIN WITH MINERALS) TABS tablet Take 1 tablet by mouth daily. Multivitamin for Adults 50+ (Iron Free)  gummy   Omega-3 Fatty Acids (FISH OIL) 1000 MG CAPS Take 1,000 mg by mouth at bedtime.   omeprazole (PRILOSEC) 40 MG capsule Take 40 mg by mouth 2 (two) times daily.   Semaglutide 14 MG TABS Take 14 mg by mouth daily.   tiZANidine (ZANAFLEX) 4 MG tablet Take 4 mg by mouth at bedtime.   [DISCONTINUED] gabapentin (NEURONTIN) 300 MG capsule Take 300 mg by mouth 3 (three) times daily.   [START ON 11/05/2023] gabapentin (NEURONTIN) 300 MG capsule Take 1 capsule (300 mg total) by mouth 2 (two) times daily.   [DISCONTINUED] liraglutide (VICTOZA) 18 MG/3ML SOPN Inject 1.2 mg into the skin every evening.   No facility-administered encounter medications on file as of 09/23/2023.     Social History: Social History   Tobacco Use   Smoking status: Never   Smokeless tobacco: Never  Vaping Use   Vaping status: Never Used  Substance Use Topics   Alcohol use: Yes    Comment: rarely   Drug use: Not Currently    Types: Marijuana    Comment: rare marijuana use only last used 3 months ago    Family Medical History: Family History  Problem Relation Age of Onset   Diabetes Mother    Hypertension Mother    Hyperlipidemia Mother    Cancer Father    Thyroid disease Sister     Physical Examination: General: Patient is well developed, well nourished, calm, collected, and in no apparent distress. Attention to examination is appropriate.  Psychiatric: Patient is non-anxious.  Head:  Pupils equal, round, and reactive to light.  ENT:  Oral mucosa appears well hydrated.  Neck:   Supple.  Decreased range of motion.  Respiratory: Patient is  breathing without any difficulty.  Extremities: No edema.  Skin:   On exposed skin, there are no abnormal skin lesions.  NEUROLOGICAL:     Awake, alert, oriented to person, place, and time.  Speech is clear and fluent. Fund of knowledge is appropriate.   Cranial Nerves: Pupils equal round and reactive to light.  Facial tone is symmetric.  ROM of spine: Decreased in cervical spine.    +Spurlings.  Strength: Side Biceps Triceps Deltoid Interossei Grip Wrist Ext. Wrist Flex.  R 5 5 5 5 5 5 5   L 5 4 5 5 5 4 5    Reflexes are 2+ in the right biceps, triceps, brachioradialis. 1+ in left triceps, 2+ in left biceps and BR.  Hoffman's is absent.  Decreased sensation in left upper extremity compared to the right. Gait is normal.    Medical Decision Making  Imaging: MRI Cervical spine: IMPRESSION: 1. C6-C7 severe left neural foraminal narrowing. 2. C3-C4 mild left neural foraminal narrowing. 3. C4-C5 and C5-C6 mild right neural foraminal narrowing. 4. C7-T1 mild left neural foraminal narrowing. 5. No spinal canal  stenosis   I have personally reviewed the images and agree with the above interpretation.  Assessment and Plan: Mr. Hausmann is a pleasant 58 y.o. male is here today with a chief complaint of left-sided C7 radiculopathy.  He has a known compression of the C7 nerve root at the foramen at C6-7 secondary to severe cervical stenosis.  This is causing a left-sided symptomatic radiculopathy at C7 with weakness in his tricep as well as decreased reflexes in loss of sensation.  He is being treated with a trial of physical therapy to see whether or not he may improve with conservative measures.  He has undergone 5 weeks of therapy at this time.  He will plan to go back for another evaluation with his physical therapist after this appointment.  He is also being treated for right hand pain and left forearm pain from his orthopedist.  At this point if he continues to have weakness in his left C7 myotome as well as pain numbness and tingling in the C7 dermatome from the C7 radiculopathy we would consider a cervical foraminotomies if he does not progress or improve in the setting of a full physical therapy trial.  Thank you for involving me in the care of this patient.   Lovenia Kim, MD Dept. of Neurosurgery

## 2023-09-24 NOTE — Telephone Encounter (Signed)
 See mychart messages

## 2023-09-30 ENCOUNTER — Other Ambulatory Visit (INDEPENDENT_AMBULATORY_CARE_PROVIDER_SITE_OTHER): Payer: Self-pay | Admitting: Gastroenterology

## 2023-09-30 ENCOUNTER — Encounter: Payer: Self-pay | Admitting: Orthopedic Surgery

## 2023-09-30 ENCOUNTER — Ambulatory Visit: Payer: 59 | Admitting: Orthopedic Surgery

## 2023-09-30 DIAGNOSIS — M654 Radial styloid tenosynovitis [de Quervain]: Secondary | ICD-10-CM | POA: Diagnosis not present

## 2023-09-30 DIAGNOSIS — K580 Irritable bowel syndrome with diarrhea: Secondary | ICD-10-CM

## 2023-09-30 DIAGNOSIS — M7712 Lateral epicondylitis, left elbow: Secondary | ICD-10-CM | POA: Diagnosis not present

## 2023-09-30 NOTE — Telephone Encounter (Signed)
 I spoke with the patient he says she has had a flare of IBS and would like to resume this Colestipol. Please refill if agreeable to [facility]. Thanks

## 2023-09-30 NOTE — Telephone Encounter (Signed)
 I spoke with patient wife Marchelle Folks (on [place]) She will ask the patient to see if he is still taking this and will call the office back with answer.

## 2023-09-30 NOTE — Progress Notes (Signed)
   There were no vitals taken for this visit.  There is no height or weight on file to calculate BMI.  Chief Complaint  Patient presents with   Elbow Pain    Left    Hand Pain    Right thumb     Encounter Diagnoses  Name Primary?   Lateral epicondylitis, left elbow Yes   De Quervain's tenosynovitis, right     DOI/DOS/ Date: na  Scott [name] returns for recheck of his dequervains  syndrome and lateral epicondylitis he is wearing a splint on his right thumb and elbow brace on the left  Both braces are controlling his symptoms  On the right wrist is ulnar deviation causes very little symptoms and he is nontender to palpation over the area.  On the left side as long as he wears the brace he has little forearm pain.  Recommend continue bracing for an additional 4 to 6 weeks and as needed to control symptoms.

## 2023-10-02 ENCOUNTER — Encounter: Payer: 59 | Admitting: Physician Assistant

## 2023-10-03 ENCOUNTER — Other Ambulatory Visit: Payer: Self-pay

## 2023-10-03 ENCOUNTER — Ambulatory Visit
Admission: RE | Admit: 2023-10-03 | Discharge: 2023-10-03 | Disposition: A | Discharge status: Home / Self Care | Payer: 59 | Source: Ambulatory Visit | Attending: Family Medicine | Admitting: Family Medicine

## 2023-10-03 VITALS — BP 123/75 | HR 115 | Temp 100.0°F | Resp 20

## 2023-10-03 DIAGNOSIS — J101 Influenza due to other identified influenza virus with other respiratory manifestations: Secondary | ICD-10-CM | POA: Diagnosis not present

## 2023-10-03 DIAGNOSIS — R002 Palpitations: Secondary | ICD-10-CM

## 2023-10-03 LAB — POC COVID19/FLU A&B COMBO
Covid Antigen, POC: NEGATIVE
Influenza A Antigen, POC: POSITIVE — AB
Influenza B Antigen, POC: NEGATIVE

## 2023-10-03 MED ORDER — BENZONATATE 100 MG PO CAPS: 100.0000 mg | ORAL_CAPSULE | Freq: Three times a day (TID) | ORAL | 0 refills | Status: DC | PRN

## 2023-10-03 NOTE — ED Provider Notes (Signed)
 [facility]    CSN: [id number] Arrival date & time: 10/03/23  0850      History   Chief Complaint Chief Complaint  Patient presents with   Cough    My daughter tested positive for Flu A last week. I was given Tamiflu as a preventative. I finished the Tamiflu. Now I have symptoms of a bad sinus infection, severe congestion, cough. I have been taking over the counter cold and flu med for 5 days. - Entered by patient    HPI Scott [name] is a 58 y.o. male.   Patient presents today with 5-day history of fever, Tmax 101.5 F, congested cough that is worse at nighttime, heartbeat feeling fast and irregular at nighttime, runny and stuffy nose, sore throat that has not improved, ear pain that is now improved, decreased appetite, and fatigue.  Patient denies shortness of breath or chest pain, abdominal pain, nausea/vomiting, or diarrhea.  Has taken over-the-counter cold and flu medication with Tylenol and phenylephrine with minimal temporary improvement.  He is nervous about his heart rate being elevated and is requesting an EKG today.  He wonders if over-the-counter medicines may be causing it.    Past Medical History:  Diagnosis Date   Anemia    Anxiety    Arthritis    Back pain    pinched nerve sciatica   Diabetes mellitus without complication (HCC)    type  2   Elevated cholesterol with elevated triglycerides    Elevated liver enzymes 2014   normal now   GERD (gastroesophageal reflux disease)    Hypertension    Hypertension    Sleep apnea    uses cpap,setting of 14    Patient Active Problem List   Diagnosis Date Noted   Abnormal liver function tests 09/02/2023   Change in bowel habit 09/02/2023   Chronic anal fissure 09/02/2023   Diverticular disease of colon 09/02/2023   Gastro-esophageal reflux disease without esophagitis 09/02/2023   Iron deficiency anemia 09/02/2023   Morbid obesity (HCC) 09/02/2023   Right wrist pain 08/28/2023   Cervical disc  disorder at C6-C7 level with radiculopathy 08/12/2023   Spinal stenosis in cervical region 08/12/2023   Left arm weakness 08/12/2023   Severe sleep apnea 05/02/2023   Insomnia 05/02/2023   Hemorrhoids 04/22/2023   IBS (irritable bowel syndrome) 12/24/2022   Palpitations 02/21/2016    Past Surgical History:  Procedure Laterality Date   BIOPSY  03/12/2023   Procedure: BIOPSY;  Surgeon: Dolores Frame, MD;  Location: [facility];  Service: Gastroenterology;;   CATARACT EXTRACTION Left    COLONOSCOPY N/A 09/29/2012   Procedure: COLONOSCOPY;  Surgeon: Charna Elizabeth, MD;  Location: [facility];  Service: Endoscopy;  Laterality: N/A;   COLONOSCOPY WITH PROPOFOL N/A 06/22/2016   Procedure: COLONOSCOPY WITH PROPOFOL;  Surgeon: Jeani Hawking, MD;  Location: [facility];  Service: Endoscopy;  Laterality: N/A;   COLONOSCOPY WITH PROPOFOL N/A 05/20/2020   Procedure: COLONOSCOPY WITH PROPOFOL;  Surgeon: Jeani Hawking, MD;  Location: [facility];  Service: Endoscopy;  Laterality: N/A;   COLONOSCOPY WITH PROPOFOL N/A 03/12/2023   Procedure: COLONOSCOPY WITH PROPOFOL;  Surgeon: Dolores Frame, MD;  Location: [facility];  Service: Gastroenterology;  Laterality: N/A;  11:00am;asa 3, can be done in Room 1 if rescheduled soon 7/15 cy   ENTEROSCOPY N/A 05/20/2020   Procedure: ENTEROSCOPY;  Surgeon: Jeani Hawking, MD;  Location: [facility];  Service: Endoscopy;  Laterality: N/A;   ESOPHAGOGASTRODUODENOSCOPY N/A 09/29/2012   Procedure:  ESOPHAGOGASTRODUODENOSCOPY (EGD);  Surgeon: Charna Elizabeth, MD;  Location: [facility];  Service: Endoscopy;  Laterality: N/A;   ESOPHAGOGASTRODUODENOSCOPY (EGD) WITH PROPOFOL N/A 06/22/2016   Procedure: ESOPHAGOGASTRODUODENOSCOPY (EGD) WITH PROPOFOL;  Surgeon: Jeani Hawking, MD;  [facility]: [facility];  Service: Endoscopy;  Laterality: N/A;   FRACTURE SURGERY     right wrist with screws   HERNIA REPAIR  2008   inguinal   KNEE ARTHROSCOPY Left 06/24/2020    Procedure: ARTHROSCOPY KNEE WITH PARTIAL MEDIAL MENISCECTOMY, CHONDROPLASTY,  OPEN CYSTECTOMY;  Surgeon: Jodi Geralds, MD;  Location: [facility];  Service: Orthopedics;  Laterality: Left;       Home Medications    Prior to Admission medications   Medication Sig Start Date End Date Taking? Authorizing Provider  benzonatate (TESSALON) 100 MG capsule Take 1 capsule (100 mg total) by mouth 3 (three) times daily as needed for cough. Do not take with alcohol or while operating or driving heavy machinery 2/95/62  Yes Cathlean Marseilles A, NP  atenolol-chlorthalidone (TENORETIC) 100-25 MG per tablet Take 1 tablet by mouth daily.    [provider]  atorvastatin (LIPITOR) 20 MG tablet Take 20 mg by mouth at bedtime.    [provider]  busPIRone (BUSPAR) 10 MG tablet Take 10 mg by mouth 3 (three) times daily. 07/07/15   [provider]  colestipol (COLESTID) 1 g tablet Take 1 tablet (1 g total) by mouth daily. Take 4 hours apart from other medications 09/30/23   Marguerita Merles, Reuel Boom, MD  Dapagliflozin Pro-metFORMIN ER (XIGDUO XR) 05-999 MG TB24 Take 1 tablet by mouth daily.    [provider]  dorzolamide-timolol (COSOPT) 2-0.5 % ophthalmic solution Place 1 drop into the left eye 2 (two) times daily.    [provider]  Ferrous Sulfate (IRON) 28 MG TABS Take 28 mg by mouth daily.    [provider]  fluticasone (FLONASE) 50 MCG/ACT nasal spray Place 1 spray into both nostrils daily. 07/09/23   [provider]  gabapentin (NEURONTIN) 300 MG capsule Take 1 capsule (300 mg total) by mouth 2 (two) times daily. 11/05/23   Lovenia Kim, MD  glucose blood test strip Use to check blood sugar once a day. 07/02/16   Carlus Pavlov, MD  HYDROcodone-acetaminophen (NORCO) 10-325 MG tablet Take 1 tablet by mouth every 6 (six) hours as needed for severe pain (pain score 7-10). 05/28/23   [provider]  hydrOXYzine (ATARAX) 50 MG tablet  Take 50 mg by mouth at bedtime. 07/04/15   [provider]  ibuprofen (ADVIL) 800 MG tablet Take 800 mg by mouth every 8 (eight) hours as needed for moderate pain (pain score 4-6).    [provider]  levocetirizine (XYZAL) 5 MG tablet Take 5 mg by mouth every evening.    [provider]  lisinopril (ZESTRIL) 5 MG tablet Take 5 mg by mouth at bedtime.    [provider]  LORazepam (ATIVAN) 0.5 MG tablet Take 0.5 mg by mouth 2 (two) times daily as needed for anxiety.    [provider]  magnesium oxide (MAG-OX) 400 (240 Mg) MG tablet Take 400 mg by mouth at bedtime.    [provider]  Melatonin 10 MG CHEW Chew 10 mg by mouth at bedtime.    [provider]  MICROLET LANCETS MISC Use to check blood sugar. 07/02/16   Carlus Pavlov, MD  Misc Natural Products (IMMUNE BOOST PO) Take 2 each by mouth daily.    [provider]  Multiple Vitamin (MULTIVITAMIN WITH MINERALS) TABS tablet Take 1 tablet by mouth daily. Multivitamin for Adults 50+ (Iron Free)  gummy    [provider]  Omega-3 Fatty Acids (FISH OIL) 1000 MG CAPS Take 1,000 mg by mouth at bedtime.    [provider]  omeprazole (PRILOSEC) 40 MG capsule Take 40 mg by mouth 2 (two) times daily. 05/09/20   [provider]  Semaglutide 14 MG TABS Take 14 mg by mouth daily. 04/29/20   [provider]  tiZANidine (ZANAFLEX) 4 MG tablet Take 4 mg by mouth at bedtime. 04/29/20   [provider]  liraglutide (VICTOZA) 18 MG/3ML SOPN Inject 1.2 mg into the skin every evening.  11/02/19  [provider]    Family History Family History  Problem Relation Age of Onset   Diabetes Mother    Hypertension Mother    Hyperlipidemia Mother    Cancer Father    Thyroid disease Sister     Social History Social History   Tobacco Use   Smoking status: Never   Smokeless tobacco: Never  Vaping Use   Vaping status: Never Used   Substance Use Topics   Alcohol use: Yes    Comment: rarely   Drug use: Not Currently    Types: Marijuana    Comment: rare marijuana use only last used 3 months ago     Allergies   Lactose intolerance (gi), Trazodone hcl, and Trulicity [dulaglutide]   Review of Systems Review of Systems Per HPI  Physical Exam Triage Vital Signs ED Triage Vitals  Encounter Vitals Group     BP 10/03/23 0911 123/75     Systolic BP Percentile --      Diastolic BP Percentile --      Pulse Rate 10/03/23 0911 (!) 115     Resp 10/03/23 0911 20     Temp 10/03/23 0911 100 F (37.8 C)     Temp Source 10/03/23 0911 Oral     SpO2 10/03/23 0911 93 %     Weight --      Height --      Head Circumference --      Peak Flow --      Pain Score 10/03/23 0912 8     Pain Loc --      Pain Education --      Exclude from Growth Chart --    No data found.  Updated Vital Signs BP 123/75 (BP Location: Right Arm)   Pulse (!) 115   Temp 100 F (37.8 C) (Oral)   Resp 20   SpO2 93%   Visual Acuity Right Eye Distance:   Left Eye Distance:   Bilateral Distance:    Right Eye Near:   Left Eye Near:    Bilateral Near:     Physical Exam Vitals and nursing note reviewed.  Constitutional:      General: He is not in acute distress.    Appearance: Normal appearance. He is ill-appearing. He is not toxic-appearing.  HENT:     Head: Normocephalic and atraumatic.     Right Ear: Tympanic membrane, ear canal and external ear normal.     Left Ear: Tympanic membrane, ear canal and external ear normal.     Nose: Congestion present. No rhinorrhea.     Mouth/Throat:     Mouth: Mucous membranes are moist.     Pharynx: Oropharynx is clear. Posterior oropharyngeal erythema present. No oropharyngeal exudate.  Eyes:  General: No scleral icterus.    Extraocular Movements: Extraocular movements intact.     Conjunctiva/sclera:     Right eye: Right conjunctiva is injected.     Left eye: Left conjunctiva is injected.      Comments: Watery eyes  Cardiovascular:     Rate and Rhythm: Regular rhythm. Tachycardia present.  Pulmonary:     Effort: Pulmonary effort is normal. No respiratory distress.     Breath sounds: Normal breath sounds. No wheezing, rhonchi or rales.  Musculoskeletal:     Cervical back: Normal range of motion and neck supple.  Lymphadenopathy:     Cervical: No cervical adenopathy.  Skin:    General: Skin is warm and dry.     Coloration: Skin is not jaundiced or pale.     Findings: No erythema or rash.  Neurological:     Mental Status: He is alert and oriented to person, place, and time.  Psychiatric:        Behavior: Behavior is cooperative.      UC Treatments / Results  Labs (all labs ordered are listed, but only abnormal results are displayed) Labs Reviewed  POC COVID19/FLU A&B COMBO - Abnormal; Notable for the following components:      Result Value   Influenza A Antigen, POC Positive (*)    All other components within normal limits    EKG   Radiology No results found.  Procedures Procedures (including critical care time)  Medications Ordered in UC Medications - No data to display  Initial Impression / Assessment and Plan / UC Course  I have reviewed the triage vital signs and the nursing notes.  Pertinent labs & imaging results that were available during my care of the patient were reviewed by me and considered in my medical decision making (see chart for details).   In triage today, patient has a low-grade fever and is mildly tachycardic.  Otherwise, vital signs are stable.  1. Influenza A Vitals and exam are reassuring today Patient is out of window for antiviral treatment Supportive care discussed with patient, recommended avoiding over-the-counter cough and cold medications that contain decongestants and phenylephrine Start Coricidin, guaifenesin Tessalon Perles sent to pharmacy for cough Return and ER precautions discussed  2. Palpitations EKG  today shows ventricular rate of 129 bpm; no ST segment or T wave abnormalities Similar when compared with previous EKG approximately 6 months ago with exception of increased heart rate Suspect secondary to influenza and/or phenylephrine use-as above recommended discontinuing phenylephrine Strict ER precautions discussed with patient  The patient was given the opportunity to ask questions.  All questions answered to their satisfaction.  The patient is in agreement to this plan.    Final Clinical Impressions(s) / UC Diagnoses   Final diagnoses:  Influenza A  Palpitations     Discharge Instructions      You tested positive for influenza A today.  Symptoms should improve over the next few days.  If you develop chest pain, worsening palpitations, or shortness of breath, go to the emergency room.  EKG today shows increased heart rate, however it is beating regularly.  Some things that can make you feel better are: - Increased rest - Increasing fluid with water/sugar free electrolytes - Acetaminophen and ibuprofen as needed for fever/pain - Salt water gargling, chloraseptic spray and throat lozenges - OTC guaifenesin (Mucinex) 600 mg twice daily for congestion - Saline sinus flushes or a neti pot - Humidifying the air - Coricidin products for symptomatic management -  Tessalon Perles every 8 hours as needed for dry cough      ED Prescriptions     Medication Sig Dispense Auth. Provider   benzonatate (TESSALON) 100 MG capsule Take 1 capsule (100 mg total) by mouth 3 (three) times daily as needed for cough. Do not take with alcohol or while operating or driving heavy machinery 21 capsule Valentino Nose, NP      PDMP not reviewed this encounter.   Valentino Nose, NP 10/03/23 1102

## 2023-10-03 NOTE — Discharge Instructions (Addendum)
 You tested positive for influenza A today.  Symptoms should improve over the next few days.  If you develop chest pain, worsening palpitations, or shortness of breath, go to the emergency room.  EKG today shows increased heart rate, however it is beating regularly.  Some things that can make you feel better are: - Increased rest - Increasing fluid with water/sugar free electrolytes - Acetaminophen and ibuprofen as needed for fever/pain - Salt water gargling, chloraseptic spray and throat lozenges - OTC guaifenesin (Mucinex) 600 mg twice daily for congestion - Saline sinus flushes or a neti pot - Humidifying the air - Coricidin products for symptomatic management -Tessalon Perles every 8 hours as needed for dry cough

## 2023-10-03 NOTE — ED Triage Notes (Addendum)
 Pt reports cough, congestion, fever, headache body ache. Pt states he was exposed to the flu last week when his daughter had it x 5 days, states he has spells where his heart rate increase making him nervous .

## 2023-10-04 ENCOUNTER — Institutional Professional Consult (permissible substitution): Payer: 59 | Admitting: Plastic Surgery

## 2023-10-11 ENCOUNTER — Telehealth: Payer: Self-pay

## 2023-10-11 DIAGNOSIS — J019 Acute sinusitis, unspecified: Secondary | ICD-10-CM | POA: Diagnosis not present

## 2023-10-11 NOTE — Telephone Encounter (Signed)
 He has been discharged from PT. Can you offer him an in person appointment with Dr [name] to re-evaluate his symptoms to see if surgery is still needed? Thanks

## 2023-10-11 NOTE — Telephone Encounter (Signed)
 10/14/2023 with Dr.Smith.

## 2023-10-12 ENCOUNTER — Other Ambulatory Visit: Payer: Self-pay | Admitting: Medical Genetics

## 2023-10-14 ENCOUNTER — Other Ambulatory Visit (HOSPITAL_COMMUNITY)
Admission: RE | Admit: 2023-10-14 | Discharge: 2023-10-14 | Disposition: A | Discharge status: Home / Self Care | Payer: Self-pay | Source: Ambulatory Visit | Attending: Medical Genetics | Admitting: Medical Genetics

## 2023-10-14 ENCOUNTER — Encounter: Payer: Self-pay | Admitting: Neurosurgery

## 2023-10-14 ENCOUNTER — Ambulatory Visit: Admitting: Neurosurgery

## 2023-10-14 VITALS — BP 122/82 | Ht 74.0 in | Wt 280.0 lb

## 2023-10-14 DIAGNOSIS — M47812 Spondylosis without myelopathy or radiculopathy, cervical region: Secondary | ICD-10-CM

## 2023-10-14 DIAGNOSIS — M50123 Cervical disc disorder at C6-C7 level with radiculopathy: Secondary | ICD-10-CM

## 2023-10-14 DIAGNOSIS — M4802 Spinal stenosis, cervical region: Secondary | ICD-10-CM

## 2023-10-14 NOTE — Progress Notes (Signed)
 Referring Physician:  No referring provider defined for this encounter.  Primary Physician:  Noberto Retort, MD  History of Present Illness: 10/14/2023 [name] is here today with a history of cervical spondylosis and bilateral upper extremity orthopedic issues.  Has been diagnosed with left-sided tennis elbow and right sided hand issues.  He is following up today to discuss his cervical spondylosis.  A significant amount of pain has improved with his extremity treatment.  Overall he is doing better.  He does continue to have some neck pain that is position dependent.  He notices that when he is in poor posture that he gets neck and periscapular pain.  This is mostly in between his shoulder blades and actually lower than his C7 prominence.     review of Systems:  A 10 point review of systems is negative, except for the pertinent positives and negatives detailed in the HPI.  Past Medical History: Past Medical History:  Diagnosis Date   Anemia    Anxiety    Arthritis    Back pain    pinched nerve sciatica   Diabetes mellitus without complication (HCC)    type  2   Elevated cholesterol with elevated triglycerides    Elevated liver enzymes 2014   normal now   GERD (gastroesophageal reflux disease)    Hypertension    Hypertension    Sleep apnea    uses cpap,setting of 14    Past Surgical History: Past Surgical History:  Procedure Laterality Date   BIOPSY  03/12/2023   Procedure: BIOPSY;  Surgeon: Dolores Frame, MD;  [facility]: [facility];  Service: Gastroenterology;;   CATARACT EXTRACTION Left    COLONOSCOPY N/A 09/29/2012   Procedure: COLONOSCOPY;  Surgeon: Charna Elizabeth, MD;  Location: [facility];  Service: Endoscopy;  Laterality: N/A;   COLONOSCOPY WITH PROPOFOL N/A 06/22/2016   Procedure: COLONOSCOPY WITH PROPOFOL;  Surgeon: Jeani Hawking, MD;  Location: [facility];  Service: Endoscopy;  Laterality: N/A;   COLONOSCOPY WITH PROPOFOL N/A  05/20/2020   Procedure: COLONOSCOPY WITH PROPOFOL;  Surgeon: Jeani Hawking, MD;  [facility];  Service: Endoscopy;  Laterality: N/A;   COLONOSCOPY WITH PROPOFOL N/A 03/12/2023   Procedure: COLONOSCOPY WITH PROPOFOL;  Surgeon: Dolores Frame, MD;  [facility];  Service: Gastroenterology;  Laterality: N/A;  11:00am;asa 3, can be done in Room 1 if rescheduled soon 7/15 cy   ENTEROSCOPY N/A 05/20/2020   Procedure: ENTEROSCOPY;  Surgeon: Jeani Hawking, MD;  [facility];  Service: Endoscopy;  Laterality: N/A;   ESOPHAGOGASTRODUODENOSCOPY N/A 09/29/2012   Procedure: ESOPHAGOGASTRODUODENOSCOPY (EGD);  Surgeon: Charna Elizabeth, MD;  [facility];  Service: Endoscopy;  Laterality: N/A;   ESOPHAGOGASTRODUODENOSCOPY (EGD) WITH PROPOFOL N/A 06/22/2016   Procedure: ESOPHAGOGASTRODUODENOSCOPY (EGD) WITH PROPOFOL;  Surgeon: Jeani Hawking, MD;  Location: [facility];  Service: Endoscopy;  Laterality: N/A;   FRACTURE SURGERY     right wrist with screws   HERNIA REPAIR  2008   inguinal   KNEE ARTHROSCOPY Left 06/24/2020   Procedure: ARTHROSCOPY KNEE WITH PARTIAL MEDIAL MENISCECTOMY, CHONDROPLASTY,  OPEN CYSTECTOMY;  Surgeon: Jodi Geralds, MD;  Location: [facility];  Service: Orthopedics;  Laterality: Left;    Allergies: Allergies as of 10/14/2023 - Review Complete 10/14/2023  Allergen Reaction Noted   Lactose intolerance (gi)  10/01/2022   Trazodone hcl  07/05/2021   Trulicity [dulaglutide] Other (See Comments) 07/02/2016    Medications: Outpatient Encounter Medications as of 10/14/2023  Medication Sig  atenolol-chlorthalidone (TENORETIC) 100-25 MG per tablet Take 1 tablet by mouth daily.   atorvastatin (LIPITOR) 20 MG tablet Take 20 mg by mouth at bedtime.   benzonatate (TESSALON) 100 MG capsule Take 1 capsule (100 mg total) by mouth 3 (three) times daily as needed for cough. Do not take with alcohol or while operating or driving heavy machinery   busPIRone  (BUSPAR) 10 MG tablet Take 10 mg by mouth 3 (three) times daily.   colestipol (COLESTID) 1 g tablet Take 1 tablet (1 g total) by mouth daily. Take 4 hours apart from other medications   Dapagliflozin Pro-metFORMIN ER (XIGDUO XR) 05-999 MG TB24 Take 1 tablet by mouth daily.   dorzolamide-timolol (COSOPT) 2-0.5 % ophthalmic solution Place 1 drop into the left eye 2 (two) times daily.   Ferrous Sulfate (IRON) 28 MG TABS Take 28 mg by mouth daily.   fluticasone (FLONASE) 50 MCG/ACT nasal spray Place 1 spray into both nostrils daily.   [START ON 11/05/2023] gabapentin (NEURONTIN) 300 MG capsule Take 1 capsule (300 mg total) by mouth 2 (two) times daily.   glucose blood test strip Use to check blood sugar once a day.   HYDROcodone-acetaminophen (NORCO) 10-325 MG tablet Take 1 tablet by mouth every 6 (six) hours as needed for severe pain (pain score 7-10).   hydrOXYzine (ATARAX) 50 MG tablet Take 50 mg by mouth at bedtime.   ibuprofen (ADVIL) 800 MG tablet Take 800 mg by mouth every 8 (eight) hours as needed for moderate pain (pain score 4-6).   levocetirizine (XYZAL) 5 MG tablet Take 5 mg by mouth every evening.   lisinopril (ZESTRIL) 5 MG tablet Take 5 mg by mouth at bedtime.   LORazepam (ATIVAN) 0.5 MG tablet Take 0.5 mg by mouth 2 (two) times daily as needed for anxiety.   magnesium oxide (MAG-OX) 400 (240 Mg) MG tablet Take 400 mg by mouth at bedtime.   Melatonin 10 MG CHEW Chew 10 mg by mouth at bedtime.   MICROLET LANCETS MISC Use to check blood sugar.   Misc Natural Products (IMMUNE BOOST PO) Take 2 each by mouth daily.   Multiple Vitamin (MULTIVITAMIN WITH MINERALS) TABS tablet Take 1 tablet by mouth daily. Multivitamin for Adults 50+ (Iron Free)  gummy   Omega-3 Fatty Acids (FISH OIL) 1000 MG CAPS Take 1,000 mg by mouth at bedtime.   omeprazole (PRILOSEC) 40 MG capsule Take 40 mg by mouth 2 (two) times daily.   Semaglutide 14 MG TABS Take 14 mg by mouth daily.   tiZANidine (ZANAFLEX) 4 MG  tablet Take 4 mg by mouth at bedtime.   [DISCONTINUED] liraglutide (VICTOZA) 18 MG/3ML SOPN Inject 1.2 mg into the skin every evening.   No facility-administered encounter medications on file as of 10/14/2023.    Social History: Social History   Tobacco Use   Smoking status: Never   Smokeless tobacco: Never  Vaping Use   Vaping status: Never Used  Substance Use Topics   Alcohol use: Yes    Comment: rarely   Drug use: Not Currently    Types: Marijuana    Comment: rare marijuana use only last used 3 months ago    Family Medical History: Family History  Problem Relation Age of Onset   Diabetes Mother    Hypertension Mother    Hyperlipidemia Mother    Cancer Father    Thyroid disease Sister     Physical Examination: General: Patient is well developed, well nourished, calm, collected, and in no  apparent distress. Attention to examination is appropriate.  Psychiatric: Patient is non-anxious.  Head:  Pupils equal, round, and reactive to light.  ENT:  Oral mucosa appears well hydrated.  Neck:   Supple.  Decreased range of motion.  Respiratory: Patient is breathing without any difficulty.  Extremities: No edema.  Skin:   On exposed skin, there are no abnormal skin lesions.  NEUROLOGICAL:     Awake, alert, oriented to person, place, and time.  Speech is clear and fluent. Fund of knowledge is appropriate.   Cranial Nerves: Pupils equal round and reactive to light.  Facial tone is symmetric.  ROM of spine: Decreased in cervical spine.    +Spurlings.  Strength: Side Biceps Triceps Deltoid Interossei Grip Wrist Ext. Wrist Flex.  R 5 5 5 5 5 5 5   L 5 4+ 5 5 5  4+ 5   Medical Decision Making  Imaging: MRI Cervical spine: IMPRESSION: 1. C6-C7 severe left neural foraminal narrowing. 2. C3-C4 mild left neural foraminal narrowing. 3. C4-C5 and C5-C6 mild right neural foraminal narrowing. 4. C7-T1 mild left neural foraminal narrowing. 5. No spinal canal stenosis   I  have personally reviewed the images and agree with the above interpretation.  Assessment and Plan: Mr. Belsito is a pleasant 58 y.o. male is here today chief complaint of cervical spondylosis.  He has been previously seen and diagnosed with left-sided tennis elbow as well as a right sided hand issue.  He is having these treated and has had an significant improvement in his extremity pain.  He is also working with physical therapy which is helped significantly with his upper extremity issues.  He does continue to have neck pain intermittently especially with certain positions.  We discussed that he does have spondylosis with C7 stenosis that could put him at risk for a C7 radiculopathy, however with his improvement in his pain with his extremity treatments that this is unlikely to be symptomatic at this time.  His point tenderness in his spine is more in the upper thoracic area approximately T4 rather than in the low cervical area.  I discussed that a cervical foraminotomies to decompress his C6-7 foramen is unlikely to improve his point tenderness at the T4 level.  I like him to continue physical therapy as this does give him improvement, as well as refer him for cervical facet injections to see whether or not he is having some facet level irritation as referring down to his medial parascapular region.  At this time I would recommend against moving forward with surgical intervention of his cervical spine.  Does not clearly correlate with his pain complaints at this time.  Also feel that he would benefit from an ergonomic evaluation of his workspace, he does note that when his computer is at a different level that can cause pain in his neck.  He is done some research himself and has improved it significantly.    Thank you for involving me in the care of this patient.   Lovenia Kim, MD Dept. of Neurosurgery   Spent a total of 30 minutes reviewing his chart, going through his current issues, reviewing  his imaging, helping him to coordinate his care going forward, this time includes the face-to-face appointment and evaluation.

## 2023-10-15 ENCOUNTER — Other Ambulatory Visit (HOSPITAL_COMMUNITY)

## 2023-10-21 DIAGNOSIS — H40021 Open angle with borderline findings, high risk, right eye: Secondary | ICD-10-CM | POA: Diagnosis not present

## 2023-10-21 DIAGNOSIS — H02886 Meibomian gland dysfunction of left eye, unspecified eyelid: Secondary | ICD-10-CM | POA: Diagnosis not present

## 2023-10-21 DIAGNOSIS — H2511 Age-related nuclear cataract, right eye: Secondary | ICD-10-CM | POA: Diagnosis not present

## 2023-10-21 DIAGNOSIS — Z961 Presence of intraocular lens: Secondary | ICD-10-CM | POA: Diagnosis not present

## 2023-10-21 DIAGNOSIS — H401122 Primary open-angle glaucoma, left eye, moderate stage: Secondary | ICD-10-CM | POA: Diagnosis not present

## 2023-10-21 DIAGNOSIS — H02883 Meibomian gland dysfunction of right eye, unspecified eyelid: Secondary | ICD-10-CM | POA: Diagnosis not present

## 2023-10-25 ENCOUNTER — Encounter (INDEPENDENT_AMBULATORY_CARE_PROVIDER_SITE_OTHER): Payer: Self-pay

## 2023-10-25 DIAGNOSIS — G4733 Obstructive sleep apnea (adult) (pediatric): Secondary | ICD-10-CM | POA: Diagnosis not present

## 2023-10-25 LAB — GENECONNECT MOLECULAR SCREEN: Genetic Analysis Overall Interpretation: NEGATIVE

## 2023-10-28 ENCOUNTER — Ambulatory Visit (INDEPENDENT_AMBULATORY_CARE_PROVIDER_SITE_OTHER): Payer: 59 | Admitting: Gastroenterology

## 2023-10-28 ENCOUNTER — Encounter: Payer: 59 | Admitting: Neurosurgery

## 2023-10-28 ENCOUNTER — Encounter (INDEPENDENT_AMBULATORY_CARE_PROVIDER_SITE_OTHER): Payer: Self-pay | Admitting: Gastroenterology

## 2023-10-28 VITALS — BP 125/84 | HR 80 | Temp 97.3°F | Ht 74.0 in | Wt 280.0 lb

## 2023-10-28 DIAGNOSIS — K580 Irritable bowel syndrome with diarrhea: Secondary | ICD-10-CM | POA: Diagnosis not present

## 2023-10-28 DIAGNOSIS — K644 Residual hemorrhoidal skin tags: Secondary | ICD-10-CM

## 2023-10-28 DIAGNOSIS — E739 Lactose intolerance, unspecified: Secondary | ICD-10-CM | POA: Diagnosis not present

## 2023-10-28 DIAGNOSIS — K649 Unspecified hemorrhoids: Secondary | ICD-10-CM

## 2023-10-28 NOTE — Progress Notes (Signed)
 [name], M.D. Gastroenterology & Hepatology [facility] [address] [place], [place] [address]  Primary Care Physician: Noberto Retort, MD [id number] [place] [address]  I will communicate my assessment and recommendations to the referring MD via EMR.  Problems: Ulcerated internal hemorrhoids status post banding External hemorrhoids IBS-D Lactose intolerance  History of Present Illness: Scott [name] is a 58 y.o. male from [place] with PMH GERD, IBS, anxiety, diabetes, hypertension, hyperlipidemia, OSA, who presents for follow up of hemorrhoids, IBS-D and lactose intolerance.  The patient was last seen on 04/22/2023. At that time, the patient was continued on colestipol.  Patient reports that he has felt very well. He is taking colestipol 2 g qday which makes him have 2 Bms per day. Has been taking lactase enzymes when taking milk. Occasionally has liquid stoolls, depending on the type if food he eats.  Has felt some abdominal cramps occasionally, especially if he get constipated with colestipol, so he may take only one pill of colestipol.  Has not had any more significant rectal bleeding. Had 4 banding sessions for [facility]. He still ahs some itching in the external hemorrhoids. He may use Preparation H as needed for this.  The patient denies having any nausea, vomiting, fever, chills, hematochezia, melena, hematemesis, abdominal distention, abdominal pain, diarrhea, jaundice, pruritus or weight loss. Wants to change to [facility] to achieve further weight loss.  Last EGD/push enteroscopy: 05/20/2020 performed by Jeani Hawking, normal esophagus stomach, duodenum and jejunum.  Last Colonoscopy:.  03/12/2023 presence of hemorrhoids in the external and internal area, normal terminal ileum and diverticulosis.  Random colonic biopsies were negative for microscopic colitis.  Past Medical History: Past Medical  History:  Diagnosis Date   Anemia    Anxiety    Arthritis    Back pain    pinched nerve sciatica   Diabetes mellitus without complication (HCC)    type  2   Elevated cholesterol with elevated triglycerides    Elevated liver enzymes 2014   normal now   GERD (gastroesophageal reflux disease)    Hypertension    Hypertension    Sleep apnea    uses cpap,setting of 14    Past Surgical History: Past Surgical History:  Procedure Laterality Date   BIOPSY  03/12/2023   Procedure: BIOPSY;  Surgeon: Dolores Frame, MD;  [facility]: [facility];  Service: Gastroenterology;;   CATARACT EXTRACTION Left    COLONOSCOPY N/A 09/29/2012   Procedure: COLONOSCOPY;  Surgeon: Charna Elizabeth, MD;  Location: [facility];  Service: Endoscopy;  Laterality: N/A;   COLONOSCOPY WITH PROPOFOL N/A 06/22/2016   Procedure: COLONOSCOPY WITH PROPOFOL;  Surgeon: Jeani Hawking, MD;  Location: [facility];  Service: Endoscopy;  Laterality: N/A;   COLONOSCOPY WITH PROPOFOL N/A 05/20/2020   Procedure: COLONOSCOPY WITH PROPOFOL;  Surgeon: Jeani Hawking, MD;  Location: [facility];  Service: Endoscopy;  Laterality: N/A;   COLONOSCOPY WITH PROPOFOL N/A 03/12/2023   Procedure: COLONOSCOPY WITH PROPOFOL;  Surgeon: Dolores Frame, MD;  Location: [facility];  Service: Gastroenterology;  Laterality: N/A;  11:00am;asa 3, can be done in Room 1 if rescheduled soon 7/15 cy   ENTEROSCOPY N/A 05/20/2020   Procedure: ENTEROSCOPY;  Surgeon: Jeani Hawking, MD;  Location: [facility];  Service: Endoscopy;  Laterality: N/A;   ESOPHAGOGASTRODUODENOSCOPY N/A 09/29/2012   Procedure: ESOPHAGOGASTRODUODENOSCOPY (EGD);  Surgeon: Charna Elizabeth, MD;  Location: [facility];  Service: Endoscopy;  Laterality: N/A;   ESOPHAGOGASTRODUODENOSCOPY (EGD) WITH  PROPOFOL N/A 06/22/2016   Procedure: ESOPHAGOGASTRODUODENOSCOPY (EGD) WITH PROPOFOL;  Surgeon: Jeani Hawking, MD;  Location: [facility];  Service: Endoscopy;  Laterality: N/A;    FRACTURE SURGERY     right wrist with screws   HERNIA REPAIR  2008   inguinal   KNEE ARTHROSCOPY Left 06/24/2020   Procedure: ARTHROSCOPY KNEE WITH PARTIAL MEDIAL MENISCECTOMY, CHONDROPLASTY,  OPEN CYSTECTOMY;  Surgeon: Jodi Geralds, MD;  Location: [facility];  Service: Orthopedics;  Laterality: Left;    Family History: Family History  Problem Relation Age of Onset   Diabetes Mother    Hypertension Mother    Hyperlipidemia Mother    Cancer Father    Thyroid disease Sister     Social History: Social History   Tobacco Use  Smoking Status Never  Smokeless Tobacco Never   Social History   Substance and Sexual Activity  Alcohol Use Yes   Comment: rarely   Social History   Substance and Sexual Activity  Drug Use Not Currently   Types: Marijuana   Comment: rare marijuana use only last used 3 months ago    Allergies: Allergies  Allergen Reactions   Lactose Intolerance (Gi)    Trazodone Hcl     Other reaction(s): rapid heartbeat   Trulicity [Dulaglutide] Other (See Comments)    Weakness - severe    Medications: Current Outpatient Medications  Medication Sig Dispense Refill   atenolol-chlorthalidone (TENORETIC) 100-25 MG per tablet Take 1 tablet by mouth daily.     atorvastatin (LIPITOR) 20 MG tablet Take 20 mg by mouth at bedtime.     busPIRone (BUSPAR) 10 MG tablet Take 10 mg by mouth 3 (three) times daily.  0   colestipol (COLESTID) 1 g tablet Take 1 tablet (1 g total) by mouth daily. Take 4 hours apart from other medications 90 tablet 3   Dapagliflozin Pro-metFORMIN ER (XIGDUO XR) 05-999 MG TB24 Take 1 tablet by mouth daily.     dorzolamide-timolol (COSOPT) 2-0.5 % ophthalmic solution Place 1 drop into the left eye 2 (two) times daily.     Ferrous Sulfate (IRON) 28 MG TABS Take 28 mg by mouth daily.     fluticasone (FLONASE) 50 MCG/ACT nasal spray Place 1 spray into both nostrils daily.     [START ON 11/05/2023] gabapentin (NEURONTIN) 300 MG capsule Take 1 capsule  (300 mg total) by mouth 2 (two) times daily. 180 capsule 1   glucose blood test strip Use to check blood sugar once a day. 100 each 12   HYDROcodone-acetaminophen (NORCO) 10-325 MG tablet Take 1 tablet by mouth every 6 (six) hours as needed for severe pain (pain score 7-10).     hydrOXYzine (ATARAX) 50 MG tablet Take 50 mg by mouth at bedtime.  0   ibuprofen (ADVIL) 800 MG tablet Take 800 mg by mouth every 8 (eight) hours as needed for moderate pain (pain score 4-6).     levocetirizine (XYZAL) 5 MG tablet Take 5 mg by mouth every evening.     lisinopril (ZESTRIL) 5 MG tablet Take 5 mg by mouth at bedtime.     LORazepam (ATIVAN) 0.5 MG tablet Take 0.5 mg by mouth 2 (two) times daily as needed for anxiety.     magnesium oxide (MAG-OX) 400 (240 Mg) MG tablet Take 400 mg by mouth at bedtime.     Melatonin 10 MG CHEW Chew 10 mg by mouth at bedtime.     MICROLET LANCETS MISC Use to check blood sugar. 100 each 2  Multiple Vitamin (MULTIVITAMIN WITH MINERALS) TABS tablet Take 1 tablet by mouth daily. Multivitamin for Adults 50+ (Iron Free)  gummy     omeprazole (PRILOSEC) 40 MG capsule Take 40 mg by mouth 2 (two) times daily.     Semaglutide 14 MG TABS Take 14 mg by mouth daily.     tiZANidine (ZANAFLEX) 4 MG tablet Take 4 mg by mouth at bedtime.     No current facility-administered medications for this visit.    Review of Systems: GENERAL: negative for malaise, night sweats HEENT: No changes in hearing or vision, no nose bleeds or other nasal problems. NECK: Negative for lumps, goiter, pain and significant neck swelling RESPIRATORY: Negative for cough, wheezing CARDIOVASCULAR: Negative for chest pain, leg swelling, palpitations, orthopnea GI: SEE HPI MUSCULOSKELETAL: Negative for joint pain or swelling, back pain, and muscle pain. SKIN: Negative for lesions, rash PSYCH: Negative for sleep disturbance, mood disorder and recent psychosocial stressors. HEMATOLOGY Negative for prolonged bleeding,  bruising easily, and swollen nodes. ENDOCRINE: Negative for cold or heat intolerance, polyuria, polydipsia and goiter. NEURO: negative for tremor, gait imbalance, syncope and seizures. The remainder of the review of systems is noncontributory.   Physical Exam: BP 125/84 (BP Location: Left Arm, Patient Position: Sitting, Cuff Size: Large)   Pulse 80   Temp (!) 97.3 F (36.3 C) (Temporal)   Ht 6\' 2"  (1.88 m)   Wt 280 lb (127 kg)   BMI 35.95 kg/m  GENERAL: The patient is AO x3, in no acute distress.  Obese. HEENT: Head is normocephalic and atraumatic. EOMI are intact. Mouth is well hydrated and without lesions. NECK: Supple. No masses LUNGS: Clear to auscultation. No presence of rhonchi/wheezing/rales. Adequate chest expansion HEART: RRR, normal s1 and s2. ABDOMEN: Soft, nontender, no guarding, no peritoneal signs, and nondistended. BS +. No masses. EXTREMITIES: Without any cyanosis, clubbing, rash, lesions or edema. NEUROLOGIC: AOx3, no focal motor deficit. SKIN: no jaundice, no rashes  Imaging/Labs: as above  I personally reviewed and interpreted the available labs, imaging and endoscopic files.  Impression and Plan: Scott [name] is a 58 y.o. male from [place] with PMH GERD, IBS, anxiety, diabetes, hypertension, hyperlipidemia, OSA, who presents for follow up of hemorrhoids, IBS-D and lactose intolerance.  Patient has presented significant improvement of his IBS-D symptoms while taking colestipol 2 g every day.  He has decreased the dose to 1 pill daily if he presents constipation, which is adequate.  He should continue taking this medication along with dietary changes and lactase enzyme supplementation to control his IBS and diarrhea episodes.  Patient presented significant improvement of his hemorrhoidal bleeding after undergoing internal hemorrhoidal banding.  He is still having some symptoms from external hemorrhoids but these are much milder.  He can use topical  therapies as needed for now to control his symptoms.  -Continue colestipol 1 to 2 g every day -Continue with lactase enzyme supplementation as needed when eating dairy -Continue with Preparation H as needed.  Can use Anusol cream or suppository as needed for a max of 7 days in a row if having persistent anal burning  All questions were answered.      [name], MD Gastroenterology and Hepatology [facility] Gastroenterology

## 2023-10-28 NOTE — Patient Instructions (Signed)
 Continue colestipol 1 to 2 g every day Continue with lactase enzyme supplementation as needed when eating dairy Continue with Preparation H as needed.  Can use Anusol cream or suppository as needed for a max of 7 days in a row if having persistent anal burning

## 2023-11-11 DIAGNOSIS — Z809 Family history of malignant neoplasm, unspecified: Secondary | ICD-10-CM | POA: Diagnosis not present

## 2023-11-11 DIAGNOSIS — M1990 Unspecified osteoarthritis, unspecified site: Secondary | ICD-10-CM | POA: Diagnosis not present

## 2023-11-11 DIAGNOSIS — Z7984 Long term (current) use of oral hypoglycemic drugs: Secondary | ICD-10-CM | POA: Diagnosis not present

## 2023-11-11 DIAGNOSIS — N529 Male erectile dysfunction, unspecified: Secondary | ICD-10-CM | POA: Diagnosis not present

## 2023-11-11 DIAGNOSIS — Z833 Family history of diabetes mellitus: Secondary | ICD-10-CM | POA: Diagnosis not present

## 2023-11-11 DIAGNOSIS — F411 Generalized anxiety disorder: Secondary | ICD-10-CM | POA: Diagnosis not present

## 2023-11-11 DIAGNOSIS — J309 Allergic rhinitis, unspecified: Secondary | ICD-10-CM | POA: Diagnosis not present

## 2023-11-11 DIAGNOSIS — E785 Hyperlipidemia, unspecified: Secondary | ICD-10-CM | POA: Diagnosis not present

## 2023-11-11 DIAGNOSIS — K219 Gastro-esophageal reflux disease without esophagitis: Secondary | ICD-10-CM | POA: Diagnosis not present

## 2023-11-11 DIAGNOSIS — I10 Essential (primary) hypertension: Secondary | ICD-10-CM | POA: Diagnosis not present

## 2023-11-11 DIAGNOSIS — Z8249 Family history of ischemic heart disease and other diseases of the circulatory system: Secondary | ICD-10-CM | POA: Diagnosis not present

## 2023-11-21 ENCOUNTER — Encounter: Payer: Self-pay | Admitting: Student in an Organized Health Care Education/Training Program

## 2023-11-21 ENCOUNTER — Ambulatory Visit
Admission: RE | Admit: 2023-11-21 | Discharge: 2023-11-21 | Disposition: A | Discharge status: Home / Self Care | Source: Ambulatory Visit | Attending: Student in an Organized Health Care Education/Training Program | Admitting: Student in an Organized Health Care Education/Training Program

## 2023-11-21 ENCOUNTER — Encounter: Payer: Self-pay | Admitting: Plastic Surgery

## 2023-11-21 ENCOUNTER — Ambulatory Visit: Admitting: Plastic Surgery

## 2023-11-21 ENCOUNTER — Ambulatory Visit: Admitting: Student in an Organized Health Care Education/Training Program

## 2023-11-21 VITALS — BP 120/87 | HR 81 | Temp 97.6°F | Resp 16 | Ht 74.0 in | Wt 275.0 lb

## 2023-11-21 VITALS — BP 133/87 | HR 86 | Ht 74.0 in | Wt 275.0 lb

## 2023-11-21 DIAGNOSIS — M47814 Spondylosis without myelopathy or radiculopathy, thoracic region: Secondary | ICD-10-CM | POA: Diagnosis not present

## 2023-11-21 DIAGNOSIS — G4730 Sleep apnea, unspecified: Secondary | ICD-10-CM | POA: Diagnosis not present

## 2023-11-21 DIAGNOSIS — M47894 Other spondylosis, thoracic region: Secondary | ICD-10-CM | POA: Insufficient documentation

## 2023-11-21 DIAGNOSIS — M549 Dorsalgia, unspecified: Secondary | ICD-10-CM | POA: Diagnosis not present

## 2023-11-21 DIAGNOSIS — M50123 Cervical disc disorder at C6-C7 level with radiculopathy: Secondary | ICD-10-CM

## 2023-11-21 DIAGNOSIS — N62 Hypertrophy of breast: Secondary | ICD-10-CM | POA: Insufficient documentation

## 2023-11-21 DIAGNOSIS — G894 Chronic pain syndrome: Secondary | ICD-10-CM | POA: Insufficient documentation

## 2023-11-21 DIAGNOSIS — M4802 Spinal stenosis, cervical region: Secondary | ICD-10-CM | POA: Diagnosis not present

## 2023-11-21 DIAGNOSIS — M47812 Spondylosis without myelopathy or radiculopathy, cervical region: Secondary | ICD-10-CM | POA: Insufficient documentation

## 2023-11-21 NOTE — Progress Notes (Signed)
 PROVIDER NOTE: Interpretation of information contained herein should be left to medically-trained personnel. Specific patient instructions are provided elsewhere under "Patient Instructions" section of medical record. This document was created in part using AI and STT-dictation technology, any transcriptional errors that may result from this process are unintentional.  Patient: Scott [name]  Service: E/M Encounter  Provider: Cephus Collin, MD  DOB: 1965/09/06  Delivery: Face-to-face  Specialty: Interventional Pain Management  MRN: [id number]  Setting: Ambulatory outpatient facility  Specialty designation: 09  Type: New Patient  Location: Outpatient office facility  PCP: Roselind Congo, MD  DOS: 11/21/2023    Referring Prov.: Carroll Clamp, MD   Primary Reason(s) for Visit: Encounter for initial evaluation of one or more chronic problems (new to examiner) potentially causing chronic pain, and posing a threat to normal musculoskeletal function. (Level of risk: High) CC: Neck Pain (Left side C6,7)  HPI  Scott [name] is a 58 y.o. year old, male patient, who comes for the first time to our practice referred by Carroll Clamp, MD for our initial evaluation of his chronic pain. He has Palpitations; IBS (irritable bowel syndrome); Hemorrhoids; Severe sleep apnea; Insomnia; Cervical disc disorder at C6-C7 level with radiculopathy; Spinal stenosis in cervical region; Left arm weakness; Right wrist pain; Abnormal liver function tests; Change in bowel habit; Diverticular disease of colon; Gastro-esophageal reflux disease without esophagitis; Iron deficiency anemia; Morbid obesity (HCC); Lactose intolerance; Gynecomastia, male; Severe back pain; Cervical facet joint syndrome; and Chronic pain syndrome on their problem list. Today he comes in for evaluation of his Neck Pain (Left side C6,7)  Pain Assessment: Location: Left Neck Radiating: down left arm tot he elbow Onset: More than a month  ago Duration: Chronic pain Quality: Discomfort, Sharp, Constant, Spasm Severity: 3 /10 (subjective, self-reported pain score)  Effect on ADL: sometimes unbearable. occassional sleep disruption Timing: Constant Modifying factors: medications, heat/massager in recliner BP: 120/87  HR: 81  Onset and Duration: Date of onset: 2023 Cause of pain:  not noted Severity: Getting worse, NAS-11 at its worse: 10/10, NAS-11 at its best: 3/10, NAS-11 now: 3/10, and NAS-11 on the average: 7/10 Timing: Night, During activity or exercise, and After activity or exercise Aggravating Factors: Bending, Motion, and Prolonged sitting Alleviating Factors: Cold packs, Hot packs, Medications, and Resting Associated Problems: Fatigue and Pain that wakes patient up Quality of Pain: Aching, Burning, Deep, Feeling of weight, Pressure-like, Sharp, Throbbing, and Uncomfortable Previous Examinations or Tests: MRI scan and X-rays Previous Treatments: Chiropractic manipulations, Epidural steroid injections, and Physical Therapy  Scott [name] is being evaluated for possible interventional pain management therapies for the treatment of his chronic pain.   Discussed the use of AI scribe software for clinical note transcription with the patient, who gave verbal consent to proceed.  History of Present Illness   Scott [name] is a 58 year old male with cervical stenosis who presents with neck pain. He was referred by Dr. Felipe Horton for evaluation of neck pain and consideration of interventional pain management.  He has experienced neck pain for the past two years, which has progressively worsened. The pain is localized around the neck and upper back area, particularly between C6 and C7 and T1-T4, and is exacerbated by certain postures and activities such as sitting without back support or bending down. He describes the pain as throbbing and sometimes associated with a popping sound in the neck.  An MRI revealed severe  stenosis, and surgery was recommended. However, due to insurance requirements, he first  underwent physical therapy, which did not alleviate the pain. He is currently taking hydrocodone and ibuprofen 800 mg for pain management, which he finds helpful.  Previously, he experienced radiating pain into his arms, which has since improved after receiving treatment for tennis elbow, including a corticosteroid injection from an orthopedic specialist. He reports an 80% improvement in this condition.  He has a history of a severe headache following a past cervical epidural injection , which was severe enough to require an ER visit. He was informed it was due to inadvertent dural puncture.  No current radiating pain into the arms but notes tenderness in the neck area, particularly around C7, T1, T2, T3, T4, spinous processes upon palpation. He reports a burning sensation associated with the neck pain. He has sleep apnea.       Meds   Current Outpatient Medications:    atenolol-chlorthalidone (TENORETIC) 100-25 MG per tablet, Take 1 tablet by mouth daily., Disp: , Rfl:    atorvastatin (LIPITOR) 20 MG tablet, Take 20 mg by mouth at bedtime., Disp: , Rfl:    busPIRone (BUSPAR) 10 MG tablet, Take 10 mg by mouth 3 (three) times daily., Disp: , Rfl: 0   colestipol (COLESTID) 1 g tablet, Take 1 tablet (1 g total) by mouth daily. Take 4 hours apart from other medications, Disp: 90 tablet, Rfl: 3   Dapagliflozin Pro-metFORMIN ER (XIGDUO XR) 05-999 MG TB24, Take 1 tablet by mouth daily., Disp: , Rfl:    dorzolamide-timolol (COSOPT) 2-0.5 % ophthalmic solution, Place 1 drop into the left eye 2 (two) times daily., Disp: , Rfl:    Ferrous Sulfate (IRON) 28 MG TABS, Take 28 mg by mouth daily., Disp: , Rfl:    fluticasone (FLONASE) 50 MCG/ACT nasal spray, Place 1 spray into both nostrils daily., Disp: , Rfl:    gabapentin (NEURONTIN) 300 MG capsule, Take 1 capsule (300 mg total) by mouth 2 (two) times daily., Disp: 180  capsule, Rfl: 1   glucose blood test strip, Use to check blood sugar once a day., Disp: 100 each, Rfl: 12   HYDROcodone-acetaminophen (NORCO) 10-325 MG tablet, Take 1 tablet by mouth every 6 (six) hours as needed for severe pain (pain score 7-10)., Disp: , Rfl:    hydrOXYzine (ATARAX) 50 MG tablet, Take 50 mg by mouth at bedtime., Disp: , Rfl: 0   ibuprofen (ADVIL) 800 MG tablet, Take 800 mg by mouth every 8 (eight) hours as needed for moderate pain (pain score 4-6)., Disp: , Rfl:    levocetirizine (XYZAL) 5 MG tablet, Take 5 mg by mouth every evening., Disp: , Rfl:    lisinopril (ZESTRIL) 5 MG tablet, Take 5 mg by mouth at bedtime., Disp: , Rfl:    LORazepam (ATIVAN) 0.5 MG tablet, Take 0.5 mg by mouth 2 (two) times daily as needed for anxiety., Disp: , Rfl:    magnesium oxide (MAG-OX) 400 (240 Mg) MG tablet, Take 400 mg by mouth at bedtime., Disp: , Rfl:    Melatonin 10 MG CHEW, Chew 10 mg by mouth at bedtime., Disp: , Rfl:    MICROLET LANCETS MISC, Use to check blood sugar., Disp: 100 each, Rfl: 2   Multiple Vitamin (MULTIVITAMIN WITH MINERALS) TABS tablet, Take 1 tablet by mouth daily. Multivitamin for Adults 50+ (Iron Free)  gummy, Disp: , Rfl:    omeprazole (PRILOSEC) 40 MG capsule, Take 40 mg by mouth 2 (two) times daily., Disp: , Rfl:    Semaglutide 14 MG TABS, Take 14 mg by  mouth daily., Disp: , Rfl:    tiZANidine (ZANAFLEX) 4 MG tablet, Take 4 mg by mouth at bedtime., Disp: , Rfl:   Imaging Review  Cervical Imaging: Cervical MR wo contrast: Results for orders placed during the hospital encounter of 06/11/23  MR CERVICAL SPINE WO CONTRAST  Narrative CLINICAL DATA:  Cervical radiculopathy  EXAM: MRI CERVICAL SPINE WITHOUT CONTRAST  TECHNIQUE: Multiplanar, multisequence MR imaging of the cervical spine was performed. No intravenous contrast was administered.  COMPARISON:  None Available.  FINDINGS: Alignment: No significant listhesis.  Vertebrae: No acute fracture,  evidence of discitis, or suspicious osseous lesion.  Cord: Normal signal and morphology.  Posterior Fossa, vertebral arteries, paraspinal tissues: Negative.  Disc levels:  C2-C3: No significant disc bulge. No spinal canal stenosis or neuroforaminal narrowing.  C3-C4: Small central disc protrusion. Left greater than right facet arthropathy. No spinal canal stenosis. Mild left neural foraminal narrowing.  C4-C5: Mild right eccentric disc bulge with superimposed central disc protrusion with annular fissure, which abuts the ventral thecal sac and spinal cord. No spinal canal stenosis. Mild right neural foraminal narrowing.  C5-C6: Mild disc bulge with superimposed right subarticular protrusion, which indents the ventral thecal sac and spinal cord. No spinal canal stenosis. Mild right neural foraminal narrowing.  C6-C7: Mild disc bulge with superimposed left foraminal protrusion. No spinal canal stenosis. Severe left neural foraminal narrowing.  C7-T1: Minimal disc bulge. Left-greater-than-right uncovertebral hypertrophy. No spinal canal stenosis. Left neural foraminal narrowing.  IMPRESSION: 1. C6-C7 severe left neural foraminal narrowing. 2. C3-C4 mild left neural foraminal narrowing. 3. C4-C5 and C5-C6 mild right neural foraminal narrowing. 4. C7-T1 mild left neural foraminal narrowing. 5. No spinal canal stenosis.   Electronically Signed By: Zoila Hines M.D. On: 06/29/2023 02:33  DG Cervical Spine Complete  Narrative CLINICAL DATA:  new arm weakness  EXAM: CERVICAL SPINE - COMPLETE 5 VIEW  COMPARISON:  None Available.  FINDINGS: No fracture, dislocation or subluxation. No spondylolisthesis. No osteolytic or osteoblastic changes. Prevertebral and cervical cranial soft tissues are unremarkable. Motion with flexion to suggest  Degenerative disc disease noted with disc space narrowing and marginal osteophytes at C4-C7.  IMPRESSION: Degenerative changes. No  acute osseous abnormalities.   Electronically Signed By: Sydell Eva M.D. On: 08/18/2023 22:48   DG Thoracic Spine 2 View  Narrative CLINICAL DATA:  Thoracic spine pain for 1 week  EXAM: THORACIC SPINE 2 VIEWS  COMPARISON:  None Available.  FINDINGS: Thoracic vertebral body heights are preserved, without evidence of acute injury. There is mild S shaped scoliotic curvature. There is no antero or retrolisthesis. The disc heights appear preserved. There is no significant degenerative change.  The imaged heart and lungs unremarkable.  IMPRESSION: Mild S shaped scoliotic curvature.  No acute finding.   Electronically Signed By: Eldora Greet M.D. On: 05/09/2023 11:45   MR LUMBAR SPINE WO CONTRAST  Narrative CLINICAL DATA:  Initial evaluation for chronic intermittent lower back pain with right buttock and leg pain.  EXAM: MRI LUMBAR SPINE WITHOUT CONTRAST  TECHNIQUE: Multiplanar, multisequence MR imaging of the lumbar spine was performed. No intravenous contrast was administered.  COMPARISON:  None available.  FINDINGS: Segmentation: Standard. Lowest well-formed disc space labeled the L5-S1 level.  Alignment: Moderate levoscoliosis with straightening of the normal lumbar lordosis. 2-3 mm retrolisthesis of L1 on L2 and L2 on L3.  Vertebrae: Vertebral body height maintained without evidence for acute or chronic fracture. Bone marrow signal intensity within normal limits. Subcentimeter benign hemangioma noted within the  L5 vertebral body. No other discrete or worrisome osseous lesions. Discogenic reactive endplate changes present about the L2-3 interspace related to scoliotic curvature. No other abnormal marrow edema.  Conus medullaris and cauda equina: Conus extends to the L1 level. Conus and cauda equina appear normal.  Paraspinal and other soft tissues: Unremarkable.  Disc levels:  T11-12: Mild diffuse disc bulge with disc desiccation. Right  greater than left facet hypertrophy. No spinal stenosis. Mild left with mild-to-moderate right foraminal narrowing.  T12-L1: Mild diffuse disc bulge with disc desiccation. No significant spinal stenosis. Foramina remain patent.  L1-2: Trace retrolisthesis. Diffuse disc bulge with disc desiccation and intervertebral disc space narrowing. Disc bulging eccentric to the left with mild reactive endplate changes. Moderate right with mild left facet hypertrophy. No significant spinal stenosis. Foramina remain patent.  L2-3: Retrolisthesis. Diffuse disc bulge with disc desiccation and intervertebral disc space narrowing. Disc bulging eccentric to the right with associated right-sided reactive marginal endplate spurring. Superimposed small right subarticular disc protrusion extends into the right lateral recess (series 13, images 16, 17). Superimposed mild facet hypertrophy. Resultant moderate right lateral recess narrowing, potentially affecting the descending right L3 nerve root. Central canal remains patent. Moderate right L2 foraminal stenosis. No significant left foraminal narrowing.  L3-4: Diffuse disc bulge with disc desiccation and intervertebral disc space narrowing. Superimposed reactive endplate changes. There is a probable moderate-sized right extraforaminal disc protrusion/extrusion, which could potentially affect the exiting right L3 nerve root (series 13, image 24). Moderate bilateral facet hypertrophy. Resultant mild lateral recess narrowing bilaterally, greater on the right. Central canal remains patent. Moderate right with mild to moderate left L3 foraminal stenosis.  L4-5: Diffuse disc bulge with disc desiccation. Mild endplate changes. Superimposed small left foraminal disc protrusion closely approximates the left L4 nerve root (series 13, image 31). Moderate facet hypertrophy. Mild narrowing of the left lateral recess without significant spinal stenosis. Mild right  with moderate left L4 foraminal narrowing.  L5-S1: Mild disc bulge with disc desiccation. Superimposed small central disc protrusion mildly indents the ventral thecal sac. Prominent left-sided reactive endplate changes. Moderate left worse than right facet hypertrophy. No significant spinal stenosis. Moderate left L5 foraminal narrowing. No significant right foraminal encroachment.  IMPRESSION: 1. Right eccentric disc bulge with superimposed right subarticular disc protrusion at L2-3 with resultant moderate right lateral recess and foraminal stenosis. Either the right L2 or L3 nerve roots could be affected. 2. Moderate-sized right extraforaminal disc protrusion/extrusion at L3-4, which could also affect the right L3 nerve root. 3. Small left foraminal disc protrusion at L4-5, closely approximating and potentially irritating the left L4 nerve root. 4. Left eccentric disc bulge with facet hypertrophy at L5-S1 with resultant moderate left L5 foraminal stenosis.   Electronically Signed By: Virgia Griffins M.D. On: 10/29/2019 03:32  MR KNEE LEFT WO CONTRAST  Narrative CLINICAL DATA:  Chronic medial knee pain. No injury or prior surgery.  EXAM: MRI OF THE LEFT KNEE WITHOUT CONTRAST  TECHNIQUE: Multiplanar, multisequence MR imaging of the knee was performed. No intravenous contrast was administered.  COMPARISON:  None.  FINDINGS: MENISCI  Medial meniscus: Horizontal undersurface tear of the body extending to the anterior and posterior horn junctions. Additional small peripheral longitudinal tear at the body/posterior horn junction.  Lateral meniscus:  Intact.  LIGAMENTS  Cruciates:  Intact ACL and PCL.  Collaterals: Medial collateral ligament is intact. Lateral collateral ligament complex is intact.  CARTILAGE  Patellofemoral: Full-thickness fissuring over the medial and lateral patellar facet. Focal partial-thickness cartilage loss  over the medial  trochlea.  Medial: Mild superficial irregularity and partial-thickness cartilage loss over the central weight-bearing medial femoral condyle and medial tibial plateau.  Lateral:  No chondral defect.  Joint:  Trace joint effusion.  Popliteal Fossa:  Small Baker cyst.  Intact popliteus tendon.  Extensor Mechanism: Intact quadriceps tendon and patellar tendon. Intact medial and lateral patellar retinaculum. Intact MPFL.  Bones: No focal marrow signal abnormality. No fracture or dislocation.  Other: 1.6 x 0.6 x 2.0 cm ganglion cyst superficial to the posterior proximal MCL. It is unclear if this communicates with the nearby Baker cyst or with the joint space through a small defect in the joint capsule.  IMPRESSION: 1. Horizontal undersurface tear of the medial meniscus body extending to the anterior and posterior horn junctions. Additional small peripheral longitudinal tear at the body/posterior horn junction. 2. Mild medial compartment osteoarthritis. 3. 2.0 cm ganglion cyst superficial to the posterior proximal MCL. It is unclear if this communicates with the nearby Baker cyst or with the joint space through a small defect in the joint capsule.   Electronically Signed By: Aleta Anda M.D. On: 06/14/2020 11:35  DG Wrist Complete Right  Narrative CLINICAL DATA:  Right wrist injury and pain.  EXAM: RIGHT WRIST - COMPLETE 3+ VIEW  COMPARISON:  08/28/2023  FINDINGS: A small linear ossific density is seen along the dorsal aspect of the distal radius which may represent avulsion fracture or chronic ligamentous calcification. No other fractures or bone lesions identified. Alignment is normal.  IMPRESSION: Small linear ossific density along the dorsal aspect of the distal radius, which may represent avulsion fracture or chronic ligamentous calcification. Recommend correlation with point tenderness.   Electronically Signed By: Marlyce Sine M.D. On: 08/31/2023  10:37   Complexity Note: Imaging results reviewed.                         ROS  Cardiovascular: High blood pressure Pulmonary or Respiratory: Temporary stoppage of breathing during sleep Neurological: No reported neurological signs or symptoms such as seizures, abnormal skin sensations, urinary and/or fecal incontinence, being born with an abnormal open spine and/or a tethered spinal cord Psychological-Psychiatric: Anxiousness and Difficulty sleeping and or falling asleep Gastrointestinal: Alternating episodes iof diarrhea and constipation (IBS-Irritable bowe syndrome) Genitourinary: No reported renal or genitourinary signs or symptoms such as difficulty voiding or producing urine, peeing blood, non-functioning kidney, kidney stones, difficulty emptying the bladder, difficulty controlling the flow of urine, or chronic kidney disease Hematological: No reported hematological signs or symptoms such as prolonged bleeding, low or poor functioning platelets, bruising or bleeding easily, hereditary bleeding problems, low energy levels due to low hemoglobin or being anemic Endocrine: High blood sugar controlled without the use of insulin (NIDDM) Rheumatologic: No reported rheumatological signs and symptoms such as fatigue, joint pain, tenderness, swelling, redness, heat, stiffness, decreased range of motion, with or without associated rash Musculoskeletal: Negative for myasthenia gravis, muscular dystrophy, multiple sclerosis or malignant hyperthermia Work History: Working full time  Allergies  Scott [name] is allergic to lactose intolerance (gi), trazodone hcl, and trulicity [dulaglutide].  Laboratory Chemistry Profile   Renal Lab Results  Component Value Date   BUN 16 02/18/2023   CREATININE 0.71 02/18/2023   GFRNONAA >60 02/18/2023     Electrolytes Lab Results  Component Value Date   NA 135 02/18/2023   K 3.0 (L) 02/18/2023   CL 99 02/18/2023   CALCIUM 8.9 02/18/2023      Hepatic  No results found for: "AST", "ALT", "ALBUMIN", "ALKPHOS", "AMYLASE", "LIPASE", "AMMONIA"   ID Lab Results  Component Value Date   SARSCOV2NAA NEGATIVE 06/21/2020     Bone No results found for: "VD25OH", "VD125OH2TOT", "ZO1096EA5", "WU9811BJ4", "25OHVITD1", "25OHVITD2", "25OHVITD3", "TESTOFREE", "TESTOSTERONE"   Endocrine Lab Results  Component Value Date   GLUCOSE 125 (H) 02/18/2023   HGBA1C 6.6 (H) 06/21/2020     Neuropathy Lab Results  Component Value Date   HGBA1C 6.6 (H) 06/21/2020     CNS No results found for: "COLORCSF", "APPEARCSF", "RBCCOUNTCSF", "WBCCSF", "POLYSCSF", "LYMPHSCSF", "EOSCSF", "PROTEINCSF", "GLUCCSF", "JCVIRUS", "CSFOLI", "IGGCSF", "LABACHR", "ACETBL"   Inflammation (CRP: Acute  ESR: Chronic) No results found for: "CRP", "ESRSEDRATE", "LATICACIDVEN"   Rheumatology No results found for: "RF", "ANA", "LABURIC", "URICUR", "LYMEIGGIGMAB", "LYMEABIGMQN", "HLAB27"   Coagulation Lab Results  Component Value Date   PLT 207 02/18/2023     Cardiovascular Lab Results  Component Value Date   HGB 14.7 02/18/2023   HCT 43.3 02/18/2023     Screening Lab Results  Component Value Date   SARSCOV2NAA NEGATIVE 06/21/2020     Cancer No results found for: "CEA", "CA125", "LABCA2"   Allergens No results found for: "ALMOND", "APPLE", "ASPARAGUS", "AVOCADO", "BANANA", "BARLEY", "BASIL", "BAYLEAF", "GREENBEAN", "LIMABEAN", "WHITEBEAN", "BEEFIGE", "REDBEET", "BLUEBERRY", "BROCCOLI", "CABBAGE", "MELON", "CARROT", "CASEIN", "CASHEWNUT", "CAULIFLOWER", "CELERY"     Note: Lab results reviewed.  PFSH  Drug: Scott [name]  reports that he does not currently use drugs after having used the following drugs: Marijuana. Alcohol:  reports current alcohol use. Tobacco:  reports that he has never smoked. He has never used smokeless tobacco. Medical:  has a past medical history of Anemia, Anxiety, Arthritis, Back pain, Diabetes mellitus without complication (HCC),  Elevated cholesterol with elevated triglycerides, Elevated liver enzymes (2014), GERD (gastroesophageal reflux disease), Hypertension, Hypertension, and Sleep apnea. Family: family history includes Cancer in his father; Diabetes in his mother; Hyperlipidemia in his mother; Hypertension in his mother; Thyroid disease in his sister.  Past Surgical History:  Procedure Laterality Date   BIOPSY  03/12/2023   Procedure: BIOPSY;  Surgeon: Dolores Frame, MD;  Location: [facility];  Service: Gastroenterology;;   CATARACT EXTRACTION Left    COLONOSCOPY N/A 09/29/2012   Procedure: COLONOSCOPY;  Surgeon: Charna Elizabeth, MD;  Location: [facility];  Service: Endoscopy;  Laterality: N/A;   COLONOSCOPY WITH PROPOFOL N/A 06/22/2016   Procedure: COLONOSCOPY WITH PROPOFOL;  Surgeon: Jeani Hawking, MD;  Location: [facility];  Service: Endoscopy;  Laterality: N/A;   COLONOSCOPY WITH PROPOFOL N/A 05/20/2020   Procedure: COLONOSCOPY WITH PROPOFOL;  Surgeon: Jeani Hawking, MD;  Location: [facility];  Service: Endoscopy;  Laterality: N/A;   COLONOSCOPY WITH PROPOFOL N/A 03/12/2023   Procedure: COLONOSCOPY WITH PROPOFOL;  Surgeon: Dolores Frame, MD;  Location: [facility];  Service: Gastroenterology;  Laterality: N/A;  11:00am;asa 3, can be done in Room 1 if rescheduled soon 7/15 cy   ENTEROSCOPY N/A 05/20/2020   Procedure: ENTEROSCOPY;  Surgeon: Jeani Hawking, MD;  Location: [facility];  Service: Endoscopy;  Laterality: N/A;   ESOPHAGOGASTRODUODENOSCOPY N/A 09/29/2012   Procedure: ESOPHAGOGASTRODUODENOSCOPY (EGD);  Surgeon: Charna Elizabeth, MD;  Location: [facility];  Service: Endoscopy;  Laterality: N/A;   ESOPHAGOGASTRODUODENOSCOPY (EGD) WITH PROPOFOL N/A 06/22/2016   Procedure: ESOPHAGOGASTRODUODENOSCOPY (EGD) WITH PROPOFOL;  Surgeon: Jeani Hawking, MD;  Location: [facility];  Service: Endoscopy;  Laterality: N/A;   FRACTURE SURGERY     right wrist with screws   HERNIA REPAIR  2008    inguinal  KNEE ARTHROSCOPY Left 06/24/2020   Procedure: ARTHROSCOPY KNEE WITH PARTIAL MEDIAL MENISCECTOMY, CHONDROPLASTY,  OPEN CYSTECTOMY;  Surgeon: Jodi Geralds, MD;  Location: [facility];  Service: Orthopedics;  Laterality: Left;   Active Ambulatory Problems    Diagnosis Date Noted   Palpitations 02/21/2016   IBS (irritable bowel syndrome) 12/24/2022   Hemorrhoids 04/22/2023   Severe sleep apnea 05/02/2023   Insomnia 05/02/2023   Cervical disc disorder at C6-C7 level with radiculopathy 08/12/2023   Spinal stenosis in cervical region 08/12/2023   Left arm weakness 08/12/2023   Right wrist pain 08/28/2023   Abnormal liver function tests 09/02/2023   Change in bowel habit 09/02/2023   Diverticular disease of colon 09/02/2023   Gastro-esophageal reflux disease without esophagitis 09/02/2023   Iron deficiency anemia 09/02/2023   Morbid obesity (HCC) 09/02/2023   Lactose intolerance 10/28/2023   Gynecomastia, male 11/21/2023   Severe back pain 11/21/2023   Cervical facet joint syndrome 11/21/2023   Chronic pain syndrome 11/21/2023   Resolved Ambulatory Problems    Diagnosis Date Noted   Rectal bleeding 12/24/2022   Chronic anal fissure 09/02/2023   First degree hemorrhoids 09/02/2023   Past Medical History:  Diagnosis Date   Anemia    Anxiety    Arthritis    Back pain    Diabetes mellitus without complication (HCC)    Elevated cholesterol with elevated triglycerides    Elevated liver enzymes 2014   GERD (gastroesophageal reflux disease)    Hypertension    Hypertension    Sleep apnea    Constitutional Exam  General appearance: Well nourished, well developed, and well hydrated. In no apparent acute distress Vitals:   11/21/23 1125  BP: 120/87  Pulse: 81  Resp: 16  Temp: 97.6 F (36.4 C)  TempSrc: Temporal  SpO2: 99%  Weight: 275 lb (124.7 kg)  Height: 6\' 2"  (1.88 m)   BMI Assessment: Estimated body mass index is 35.31 kg/m as calculated from the following:    Height as of this encounter: 6\' 2"  (1.88 m).   Weight as of this encounter: 275 lb (124.7 kg).  BMI interpretation table: BMI level Category Range association with higher incidence of chronic pain  <18 kg/m2 Underweight   18.5-24.9 kg/m2 Ideal body weight   25-29.9 kg/m2 Overweight Increased incidence by 20%  30-34.9 kg/m2 Obese (Class I) Increased incidence by 68%  35-39.9 kg/m2 Severe obesity (Class II) Increased incidence by 136%  >40 kg/m2 Extreme obesity (Class III) Increased incidence by 254%   Patient's current BMI Ideal Body weight  Body mass index is 35.31 kg/m. Ideal body weight: 82.2 kg (181 lb 3.5 oz) Adjusted ideal body weight: 99.2 kg (218 lb 11.7 oz)   BMI Readings from Last 4 Encounters:  11/21/23 35.31 kg/m  11/21/23 35.31 kg/m  10/28/23 35.95 kg/m  10/14/23 35.95 kg/m   Wt Readings from Last 4 Encounters:  11/21/23 275 lb (124.7 kg)  11/21/23 275 lb (124.7 kg)  10/28/23 280 lb (127 kg)  10/14/23 280 lb (127 kg)    Psych/Mental status: Alert, oriented x 3 (person, place, & time)       Eyes: PERLA Respiratory: No evidence of acute respiratory distress  Cervical Spine Area Exam  Skin & Axial Inspection: No masses, redness, edema, swelling, or associated skin lesions Alignment: Symmetrical Functional ROM: Pain restricted ROM      Stability: No instability detected Muscle Tone/Strength: Functionally intact. No obvious neuro-muscular anomalies detected. Sensory (Neurological): Musculoskeletal pain pattern Palpation: No palpable anomalies  Upper Extremity (UE) Exam    Side: Right upper extremity  Side: Left upper extremity  Skin & Extremity Inspection: Skin color, temperature, and hair growth are WNL. No peripheral edema or cyanosis. No masses, redness, swelling, asymmetry, or associated skin lesions. No contractures.  Skin & Extremity Inspection: Skin color, temperature, and hair growth are WNL. No peripheral edema or cyanosis. No masses,  redness, swelling, asymmetry, or associated skin lesions. No contractures.  Functional ROM: Unrestricted ROM          Functional ROM: Unrestricted ROM          Muscle Tone/Strength: Functionally intact. No obvious neuro-muscular anomalies detected.  Muscle Tone/Strength: Functionally intact. No obvious neuro-muscular anomalies detected.  Sensory (Neurological): Unimpaired          Sensory (Neurological): Unimpaired          Palpation: No palpable anomalies              Palpation: No palpable anomalies              Provocative Test(s):  Phalen's test: deferred Tinel's test: deferred Apley's scratch test (touch opposite shoulder):  Action 1 (Across chest): deferred Action 2 (Overhead): deferred Action 3 (LB reach): deferred   Provocative Test(s):  Phalen's test: deferred Tinel's test: deferred Apley's scratch test (touch opposite shoulder):  Action 1 (Across chest): deferred Action 2 (Overhead): deferred Action 3 (LB reach): deferred    5 out of 5 strength bilateral upper extremity: Shoulder abduction, elbow flexion, elbow extension, thumb extension.   Thoracic Spine Area Exam  Skin & Axial Inspection: No masses, redness, or swelling Alignment: Symmetrical Functional ROM: Pain restricted ROM Stability: No instability detected Muscle Tone/Strength: Functionally intact. No obvious neuro-muscular anomalies detected. Sensory (Neurological): Unimpaired Muscle strength & Tone: Complains of area being tender to palpation overlying T1-T4  Assessment  Primary Diagnosis & Pertinent Problem List: The primary encounter diagnosis was Cervical spondylosis. Diagnoses of Cervical facet joint syndrome, Thoracic facet joint syndrome, and Chronic pain syndrome were also pertinent to this visit.  Visit Diagnosis (New problems to examiner): 1. Cervical spondylosis   2. Cervical facet joint syndrome   3. Thoracic facet joint syndrome   4. Chronic pain syndrome    Plan of Care (Initial workup plan)   Assessment and Plan    Cervical and thoracic spine pain   Chronic neck and upper back pain has persisted for two years and is worsening. MRI reveals severe stenosis at C6-C7. Pain is localized to the cervical and thoracic regions, with point tenderness worsened by posture and activities. Previous radiating arm pain has improved, likely due to tennis elbow treatment, and current pain is primarily localized without significant radicular symptoms. Perform cervical and thoracic facet medial branch nerve blocks on May 7th using x-ray guidance for accurate needle placement. Administer light sedation. Advise icing the neck and resting on the procedure day. Evaluate response to the nerve block for potential nerve ablation. Discuss risks, including injury to blood vessels, muscles, nerves, infection, and severe headache. I will get up to date Thoracic spine xray as well.  Tennis elbow   Previous radiating arm pain was attributed to tennis elbow and treated with an injection by an orthopedic specialist. He reports 80% improvement in symptoms.      Imaging Orders         DG Thoracic Spine 2 View     Procedure Orders         CERVICAL FACET (MEDIAL BRANCH NERVE BLOCK)  Provider-requested follow-up: Return in about 20 days (around 12/11/2023) for B/L C7, T1, T2, T3 MBNB, in clinic IV Versed.  Future Appointments  Date Time Provider Department Center  11/28/2023  9:30 AM Darrin Emerald, MD OCR-OCR None   I discussed the assessment and treatment plan with the patient. The patient was provided an opportunity to ask questions and all were answered. The patient agreed with the plan and demonstrated an understanding of the instructions.  Patient advised to call back or seek an in-person evaluation if the symptoms or condition worsens.  Duration of encounter: .  Total time on encounter, as per AMA guidelines included both the face-to-face and non-face-to-face time personally spent by the  physician and/or other qualified health care professional(s) on the day of the encounter (includes time in activities that require the physician or other qualified health care professional and does not include time in activities normally performed by clinical staff). Physician's time may include the following activities when performed: Preparing to see the patient (e.g., pre-charting review of records, searching for previously ordered imaging, lab work, and nerve conduction tests) Review of prior analgesic pharmacotherapies. Reviewing PMP Interpreting ordered tests (e.g., lab work, imaging, nerve conduction tests) Performing post-procedure evaluations, including interpretation of diagnostic procedures Obtaining and/or reviewing separately obtained history Performing a medically appropriate examination and/or evaluation Counseling and educating the patient/family/caregiver Ordering medications, tests, or procedures Referring and communicating with other health care professionals (when not separately reported) Documenting clinical information in the electronic or other health record Independently interpreting results (not separately reported) and communicating results to the patient/ family/caregiver Care coordination (not separately reported)  Note by: Cephus Collin, MD (TTS and AI technology used. I apologize for any typographical errors that were not detected and corrected.) Date: 11/21/2023; Time: 1:13 PM

## 2023-11-21 NOTE — Progress Notes (Signed)
 Patient ID: Scott [name], male    DOB: [birth year], 58 y.o.   MRN: [id number]   Chief Complaint  Patient presents with   Consult    The patient is a 58 y.o. male with a history of gynecomastia for as long as he can remember.  He has extremely large breasts causing symptoms that include the following: Back pain in the upper and lower back, including neck pain.  He has been to a neurosurgeon and spine surgery was denied.  An MRI from 2024 shows C6-7 severe left neuroforaminal narrowing.  C3-C4 mild left neuroforaminal narrowing, C4-5 and C5-C6 mild right neural foraminal narrowing and C7-T1 mild left neural foraminal narrowing. Pain medication is sometimes required with motrin and tylenol.  He has also been on narcotics to try to help with the pain.  Activities that are hindered by enlarged breasts include: exercise and running.  She has tried supportive clothing.  His breasts are extremely large and fairly symmetric.  Classify him as grade 4 significant enlargement with noticeable he has hyperpigmentation of the inframammary area on both sides.  He is 6 feet 2 inches tall and weighs 75 pounds.  The BMI = 35.3 kg/m.   Mammogram history: none. The patient expresses the desire to pursue surgical intervention.  His past medical history includes diabetes, hypertension, sleep apnea, anxiety, arthritis and hyperlipidemia.  His past surgical history includes a hernia repair from his inguinal area and endoscopies.  In the past year he has lost approximately 75 pounds and is working on losing more.    Review of Systems  Constitutional:  Positive for activity change. Negative for appetite change.  Eyes: Negative.   Respiratory: Negative.    Cardiovascular: Negative.   Gastrointestinal: Negative.   Endocrine: Negative.   Genitourinary: Negative.   Musculoskeletal:  Positive for back pain and neck pain.    Past Medical History:  Diagnosis Date   Anemia    Anxiety    Arthritis    Back pain     pinched nerve sciatica   Diabetes mellitus without complication (HCC)    type  2   Elevated cholesterol with elevated triglycerides    Elevated liver enzymes 2014   normal now   GERD (gastroesophageal reflux disease)    Hypertension    Hypertension    Sleep apnea    uses cpap,setting of 14    Past Surgical History:  Procedure Laterality Date   BIOPSY  03/12/2023   Procedure: BIOPSY;  Surgeon: Dolores Frame, MD;  Location: [facility];  Service: Gastroenterology;;   CATARACT EXTRACTION Left    COLONOSCOPY N/A 09/29/2012   Procedure: COLONOSCOPY;  Surgeon: Charna Elizabeth, MD;  Location: [facility];  Service: Endoscopy;  Laterality: N/A;   COLONOSCOPY WITH PROPOFOL N/A 06/22/2016   Procedure: COLONOSCOPY WITH PROPOFOL;  Surgeon: Jeani Hawking, MD;  Location: [facility];  Service: Endoscopy;  Laterality: N/A;   COLONOSCOPY WITH PROPOFOL N/A 05/20/2020   Procedure: COLONOSCOPY WITH PROPOFOL;  Surgeon: Jeani Hawking, MD;  Location: [facility];  Service: Endoscopy;  Laterality: N/A;   COLONOSCOPY WITH PROPOFOL N/A 03/12/2023   Procedure: COLONOSCOPY WITH PROPOFOL;  Surgeon: Dolores Frame, MD;  Location: [facility];  Service: Gastroenterology;  Laterality: N/A;  11:00am;asa 3, can be done in Room 1 if rescheduled soon 7/15 cy   ENTEROSCOPY N/A 05/20/2020   Procedure: ENTEROSCOPY;  Surgeon: Jeani Hawking, MD;  Location: [facility];  Service: Endoscopy;  Laterality: N/A;   ESOPHAGOGASTRODUODENOSCOPY N/A 09/29/2012  Procedure: ESOPHAGOGASTRODUODENOSCOPY (EGD);  Surgeon: Charna Elizabeth, MD;  Location: [facility];  Service: Endoscopy;  Laterality: N/A;   ESOPHAGOGASTRODUODENOSCOPY (EGD) WITH PROPOFOL N/A 06/22/2016   Procedure: ESOPHAGOGASTRODUODENOSCOPY (EGD) WITH PROPOFOL;  Surgeon: Jeani Hawking, MD;  Location: [facility];  Service: Endoscopy;  Laterality: N/A;   FRACTURE SURGERY     right wrist with screws   HERNIA REPAIR  2008   inguinal   KNEE ARTHROSCOPY  Left 06/24/2020   Procedure: ARTHROSCOPY KNEE WITH PARTIAL MEDIAL MENISCECTOMY, CHONDROPLASTY,  OPEN CYSTECTOMY;  Surgeon: Jodi Geralds, MD;  Location: [facility];  Service: Orthopedics;  Laterality: Left;      Current Outpatient Medications:    atenolol-chlorthalidone (TENORETIC) 100-25 MG per tablet, Take 1 tablet by mouth daily., Disp: , Rfl:    atorvastatin (LIPITOR) 20 MG tablet, Take 20 mg by mouth at bedtime., Disp: , Rfl:    busPIRone (BUSPAR) 10 MG tablet, Take 10 mg by mouth 3 (three) times daily., Disp: , Rfl: 0   colestipol (COLESTID) 1 g tablet, Take 1 tablet (1 g total) by mouth daily. Take 4 hours apart from other medications, Disp: 90 tablet, Rfl: 3   Dapagliflozin Pro-metFORMIN ER (XIGDUO XR) 05-999 MG TB24, Take 1 tablet by mouth daily., Disp: , Rfl:    dorzolamide-timolol (COSOPT) 2-0.5 % ophthalmic solution, Place 1 drop into the left eye 2 (two) times daily., Disp: , Rfl:    Ferrous Sulfate (IRON) 28 MG TABS, Take 28 mg by mouth daily., Disp: , Rfl:    fluticasone (FLONASE) 50 MCG/ACT nasal spray, Place 1 spray into both nostrils daily., Disp: , Rfl:    gabapentin (NEURONTIN) 300 MG capsule, Take 1 capsule (300 mg total) by mouth 2 (two) times daily., Disp: 180 capsule, Rfl: 1   glucose blood test strip, Use to check blood sugar once a day., Disp: 100 each, Rfl: 12   HYDROcodone-acetaminophen (NORCO) 10-325 MG tablet, Take 1 tablet by mouth every 6 (six) hours as needed for severe pain (pain score 7-10)., Disp: , Rfl:    hydrOXYzine (ATARAX) 50 MG tablet, Take 50 mg by mouth at bedtime., Disp: , Rfl: 0   ibuprofen (ADVIL) 800 MG tablet, Take 800 mg by mouth every 8 (eight) hours as needed for moderate pain (pain score 4-6)., Disp: , Rfl:    levocetirizine (XYZAL) 5 MG tablet, Take 5 mg by mouth every evening., Disp: , Rfl:    lisinopril (ZESTRIL) 5 MG tablet, Take 5 mg by mouth at bedtime., Disp: , Rfl:    LORazepam (ATIVAN) 0.5 MG tablet, Take 0.5 mg by mouth 2 (two) times  daily as needed for anxiety., Disp: , Rfl:    magnesium oxide (MAG-OX) 400 (240 Mg) MG tablet, Take 400 mg by mouth at bedtime., Disp: , Rfl:    Melatonin 10 MG CHEW, Chew 10 mg by mouth at bedtime., Disp: , Rfl:    MICROLET LANCETS MISC, Use to check blood sugar., Disp: 100 each, Rfl: 2   Multiple Vitamin (MULTIVITAMIN WITH MINERALS) TABS tablet, Take 1 tablet by mouth daily. Multivitamin for Adults 50+ (Iron Free)  gummy, Disp: , Rfl:    omeprazole (PRILOSEC) 40 MG capsule, Take 40 mg by mouth 2 (two) times daily., Disp: , Rfl:    Semaglutide 14 MG TABS, Take 14 mg by mouth daily., Disp: , Rfl:    tiZANidine (ZANAFLEX) 4 MG tablet, Take 4 mg by mouth at bedtime., Disp: , Rfl:    Objective:   Vitals:   11/21/23 0930  BP: 133/87  Pulse: 86  SpO2: 99%    Physical Exam Vitals reviewed.  Constitutional:      Appearance: Normal appearance.  HENT:     Head: Normocephalic and atraumatic.  Cardiovascular:     Rate and Rhythm: Normal rate.     Pulses: Normal pulses.  Pulmonary:     Effort: Pulmonary effort is normal.  Abdominal:     General: There is no distension.     Palpations: Abdomen is soft.     Tenderness: There is no abdominal tenderness.  Musculoskeletal:        General: No swelling or tenderness.  Skin:    General: Skin is warm.     Coloration: Skin is not jaundiced.     Findings: No bruising.  Neurological:     Mental Status: He is alert and oriented to person, place, and time.  Psychiatric:        Mood and Affect: Mood normal.        Behavior: Behavior normal.        Thought Content: Thought content normal.        Judgment: Judgment normal.     Assessment & Plan:  Gynecomastia, male  Spinal stenosis in cervical region  Cervical disc disorder at C6-C7 level with radiculopathy  Severe sleep apnea  Severe back pain  The patient is very motivated to decrease his back pain.  I think it is reasonable to do a gynecomastia surgery with plan for breast  reduction.  He might also need liposuction.  Pictures were obtained of the patient and placed in the chart with the patient's or guardian's permission.   Lindaann Requena Nagi Furio, DO

## 2023-11-21 NOTE — Patient Instructions (Signed)
 ______________________________________________________________________    General Risks and Possible Complications  Patient Responsibilities: It is important that you read this as it is part of your informed consent. It is our duty to inform you of the risks and possible complications associated with treatments offered to you. It is your responsibility as a patient to read this and to ask questions about anything that is not clear or that you believe was not covered in this document.  Patient's Rights: You have the right to refuse treatment. You also have the right to change your mind, even after initially having agreed to have the treatment done. However, under this last option, if you wait until the last second to change your mind, you may be charged for the materials used up to that point.  Introduction: Medicine is not an Visual merchandiser. Everything in Medicine, including the lack of treatment(s), carries the potential for danger, harm, or loss (which is by definition: Risk). In Medicine, a complication is a secondary problem, condition, or disease that can aggravate an already existing one. All treatments carry the risk of possible complications. The fact that a side effects or complications occurs, does not imply that the treatment was conducted incorrectly. It must be clearly understood that these can happen even when everything is done following the highest safety standards.  No treatment: You can choose not to proceed with the proposed treatment alternative. The "PRO(s)" would include: avoiding the risk of complications associated with the therapy. The "CON(s)" would include: not getting any of the treatment benefits. These benefits fall under one of three categories: diagnostic; therapeutic; and/or palliative. Diagnostic benefits include: getting information which can ultimately lead to improvement of the disease or symptom(s). Therapeutic benefits are those associated with the successful  treatment of the disease. Finally, palliative benefits are those related to the decrease of the primary symptoms, without necessarily curing the condition (example: decreasing the pain from a flare-up of a chronic condition, such as incurable terminal cancer).  General Risks and Complications: These are associated to most interventional treatments. They can occur alone, or in combination. They fall under one of the following six (6) categories: no benefit or worsening of symptoms; bleeding; infection; nerve damage; allergic reactions; and/or death. No benefits or worsening of symptoms: In Medicine there are no guarantees, only probabilities. No healthcare provider can ever guarantee that a medical treatment will work, they can only state the probability that it may. Furthermore, there is always the possibility that the condition may worsen, either directly, or indirectly, as a consequence of the treatment. Bleeding: This is more common if the patient is taking a blood thinner, either prescription or over the counter (example: Goody Powders, Fish oil, Aspirin, Garlic, etc.), or if suffering a condition associated with impaired coagulation (example: Hemophilia, cirrhosis of the liver, low platelet counts, etc.). However, even if you do not have one on these, it can still happen. If you have any of these conditions, or take one of these drugs, make sure to notify your treating physician. Infection: This is more common in patients with a compromised immune system, either due to disease (example: diabetes, cancer, human immunodeficiency virus [HIV], etc.), or due to medications or treatments (example: therapies used to treat cancer and rheumatological diseases). However, even if you do not have one on these, it can still happen. If you have any of these conditions, or take one of these drugs, make sure to notify your treating physician. Nerve Damage: This is more common when the treatment is  an invasive one, but it  can also happen with the use of medications, such as those used in the treatment of cancer. The damage can occur to small secondary nerves, or to large primary ones, such as those in the spinal cord and brain. This damage may be temporary or permanent and it may lead to impairments that can range from temporary numbness to permanent paralysis and/or brain death. Allergic Reactions: Any time a substance or material comes in contact with our body, there is the possibility of an allergic reaction. These can range from a mild skin rash (contact dermatitis) to a severe systemic reaction (anaphylactic reaction), which can result in death. Death: In general, any medical intervention can result in death, most of the time due to an unforeseen complication. ______________________________________________________________________      ______________________________________________________________________    Preparing for your procedure  Appointments: If you think you may not be able to keep your appointment, call 24-48 hours in advance to cancel. We need time to make it available to others.  Procedure visits are for procedures only. During your procedure appointment there will be: NO Prescription Refills*. NO medication changes or discussions*. NO discussion of disability issues*. NO unrelated pain problem evaluations*. NO evaluations to order other pain procedures*. *These will be addressed at a separate and distinct evaluation encounter on the provider's evaluation schedule and not during procedure days.  Instructions: Food intake: Avoid eating anything solid for at least 8 hours prior to your procedure. Clear liquid intake: You may take clear liquids such as water up to 2 hours prior to your procedure. (No carbonated drinks. No soda.) Transportation: Unless otherwise stated by your physician, bring a driver. (Driver cannot be a Market researcher, Pharmacist, community, or any other form of public transportation.) Morning  Medicines: Except for blood thinners, take all of your other morning medications with a sip of water. Make sure to take your heart and blood pressure medicines. If your blood pressure's lower number is above 100, the case will be rescheduled. Blood thinners: Make sure to stop your blood thinners as instructed.  If you take a blood thinner, but were not instructed to stop it, call our office [phone] and ask to talk to a nurse. Not stopping a blood thinner prior to certain procedures could lead to serious complications. Diabetics on insulin: Notify the staff so that you can be scheduled 1st case in the morning. If your diabetes requires high dose insulin, take only  of your normal insulin dose the morning of the procedure and notify the staff that you have done so. Preventing infections: Shower with an antibacterial soap the morning of your procedure.  Build-up your immune system: Take 1000 mg of Vitamin C with every meal (3 times a day) the day prior to your procedure. Antibiotics: Inform the nursing staff if you are taking any antibiotics or if you have any conditions that may require antibiotics prior to procedures. (Example: recent joint implants)   Pregnancy: If you are pregnant make sure to notify the nursing staff. Not doing so may result in injury to the fetus, including death.  Sickness: If you have a cold, fever, or any active infections, call and cancel or reschedule your procedure. Receiving steroids while having an infection may result in complications. Arrival: You must be in the facility at least 30 minutes prior to your scheduled procedure. Tardiness: Your scheduled time is also the cutoff time. If you do not arrive at least 15 minutes prior to your procedure, you will  be rescheduled.  Children: Do not bring any children with you. Make arrangements to keep them home. Dress appropriately: There is always a possibility that your clothing may get soiled. Avoid long dresses. Valuables:  Do not bring any jewelry or valuables.  Reasons to call and reschedule or cancel your procedure: (Following these recommendations will minimize the risk of a serious complication.) Surgeries: Avoid having procedures within 2 weeks of any surgery. (Avoid for 2 weeks before or after any surgery). Flu Shots: Avoid having procedures within 2 weeks of a flu shots or . (Avoid for 2 weeks before or after immunizations). Barium: Avoid having a procedure within 7-10 days after having had a radiological study involving the use of radiological contrast. (Myelograms, Barium swallow or enema study). Heart attacks: Avoid any elective procedures or surgeries for the initial 6 months after a "Myocardial Infarction" (Heart Attack). Blood thinners: It is imperative that you stop these medications before procedures. Let us know if you if you take any blood thinner.  Infection: Avoid procedures during or within two weeks of an infection (including chest colds or gastrointestinal problems). Symptoms associated with infections include: Localized redness, fever, chills, night sweats or profuse sweating, burning sensation when voiding, cough, congestion, stuffiness, runny nose, sore throat, diarrhea, nausea, vomiting, cold or Flu symptoms, recent or current infections. It is specially important if the infection is over the area that we intend to treat. Heart and lung problems: Symptoms that may suggest an active cardiopulmonary problem include: cough, chest pain, breathing difficulties or shortness of breath, dizziness, ankle swelling, uncontrolled high or unusually low blood pressure, and/or palpitations. If you are experiencing any of these symptoms, cancel your procedure and contact your primary care physician for an evaluation.  Remember:  Regular Business hours are:  Monday to Thursday 8:00 AM to 4:00 PM  Provider's Schedule: Delano Metz, MD:  Procedure days: Tuesday and Thursday 7:30 AM to 4:00 PM  Edward Jolly, MD:  Procedure days: Monday and Wednesday 7:30 AM to 4:00 PM Last  Updated: 07/16/2023 ______________________________________________________________________     Facet Blocks Patient Information  Description: The facets are joints in the spine between the vertebrae.  Like any joints in the body, facets can become irritated and painful.  Arthritis can also effect the facets.  By injecting steroids and local anesthetic in and around these joints, we can temporarily block the nerve supply to them.  Steroids act directly on irritated nerves and tissues to reduce selling and inflammation which often leads to decreased pain.  Facet blocks may be done anywhere along the spine from the neck to the low back depending upon the location of your pain.   After numbing the skin with local anesthetic (like Novocaine), a small needle is passed onto the facet joints under x-ray guidance.  You may experience a sensation of pressure while this is being done.  The entire block usually lasts about 15-25 minutes.   Conditions which may be treated by facet blocks:  Low back/buttock pain Neck/shoulder pain Certain types of headaches  Preparation for the injection:  Do not eat any solid food or dairy products within 8 hours of your appointment. You may drink clear liquid up to 3 hours before appointment.  Clear liquids include water, black coffee, juice or soda.  No milk or cream please. You may take your regular medication, including pain medications, with a sip of water before your appointment.  Diabetics should hold regular insulin (if taken separately) and take 1/2 normal NPH dose  the morning of the procedure.  Carry some sugar containing items with you to your appointment. A driver must accompany you and be prepared to drive you home after your procedure. Bring all your current medications with you. An IV may be inserted and sedation may be given at the discretion of the physician. A blood pressure  cuff, EKG and other monitors will often be applied during the procedure.  Some patients may need to have extra oxygen administered for a short period. You will be asked to provide medical information, including your allergies and medications, prior to the procedure.  We must know immediately if you are taking blood thinners (like Coumadin/Warfarin) or if you are allergic to IV iodine contrast (dye).  We must know if you could possible be pregnant.  Possible side-effects:  Bleeding from needle site Infection (rare, may require surgery) Nerve injury (rare) Numbness & tingling (temporary) Difficulty urinating (rare, temporary) Spinal headache (a headache worse with upright posture) Light-headedness (temporary) Pain at injection site (serveral days) Decreased blood pressure (rare, temporary) Weakness in arm/leg (temporary) Pressure sensation in back/neck (temporary)   Call if you experience:  Fever/chills associated with headache or increased back/neck pain Headache worsened by an upright position New onset, weakness or numbness of an extremity below the injection site Hives or difficulty breathing (go to the emergency room) Inflammation or drainage at the injection site(s) Severe back/neck pain greater than usual New symptoms which are concerning to you  Please note:  Although the local anesthetic injected can often make your back or neck feel good for several hours after the injection, the pain will likely return. It takes 3-7 days for steroids to work.  You may not notice any pain relief for at least one week.  If effective, we will often do a series of 2-3 injections spaced 3-6 weeks apart to maximally decrease your pain.  After the initial series, you may be a candidate for a more permanent nerve block of the facets.  If you have any questions, please call [phone] [facility]

## 2023-11-21 NOTE — Progress Notes (Signed)
 Safety precautions to be maintained throughout the outpatient stay will include: orient to surroundings, keep bed in low position, maintain call bell within reach at all times, provide assistance with transfer out of bed and ambulation.

## 2023-11-26 ENCOUNTER — Telehealth: Payer: Self-pay | Admitting: Plastic Surgery

## 2023-11-26 NOTE — Telephone Encounter (Signed)
 Patients would like to know how a peer to peer works, to appeal Ins Denial, please reach out and advise

## 2023-11-28 ENCOUNTER — Ambulatory Visit: Payer: 59 | Admitting: Orthopedic Surgery

## 2023-12-09 ENCOUNTER — Encounter: Payer: 59 | Admitting: Physician Assistant

## 2023-12-11 ENCOUNTER — Ambulatory Visit
Admission: RE | Admit: 2023-12-11 | Discharge: 2023-12-11 | Disposition: A | Discharge status: Home / Self Care | Source: Ambulatory Visit | Attending: Student in an Organized Health Care Education/Training Program | Admitting: Student in an Organized Health Care Education/Training Program

## 2023-12-11 ENCOUNTER — Encounter: Payer: Self-pay | Admitting: Student in an Organized Health Care Education/Training Program

## 2023-12-11 ENCOUNTER — Ambulatory Visit
Attending: Student in an Organized Health Care Education/Training Program | Admitting: Student in an Organized Health Care Education/Training Program

## 2023-12-11 DIAGNOSIS — M47812 Spondylosis without myelopathy or radiculopathy, cervical region: Secondary | ICD-10-CM | POA: Insufficient documentation

## 2023-12-11 DIAGNOSIS — G894 Chronic pain syndrome: Secondary | ICD-10-CM | POA: Diagnosis not present

## 2023-12-11 DIAGNOSIS — M47894 Other spondylosis, thoracic region: Secondary | ICD-10-CM | POA: Insufficient documentation

## 2023-12-11 MED ORDER — ROPIVACAINE HCL 2 MG/ML IJ SOLN
INTRAMUSCULAR | Status: AC
  Filled 2023-12-11: qty 20

## 2023-12-11 MED ORDER — MIDAZOLAM HCL 2 MG/2ML IJ SOLN
INTRAMUSCULAR | Status: AC
  Filled 2023-12-11: qty 2

## 2023-12-11 MED ORDER — LACTATED RINGERS IV SOLN: Freq: Once | INTRAVENOUS | Status: AC

## 2023-12-11 MED ORDER — DEXAMETHASONE SODIUM PHOSPHATE 10 MG/ML IJ SOLN
INTRAMUSCULAR | Status: AC
  Filled 2023-12-11: qty 2

## 2023-12-11 MED ORDER — LIDOCAINE HCL 2 % IJ SOLN
20.0000 mL | Freq: Once | INTRAMUSCULAR | Status: AC
  Administered 2023-12-11: 400 mg

## 2023-12-11 MED ORDER — ROPIVACAINE HCL 2 MG/ML IJ SOLN
18.0000 mL | Freq: Once | INTRAMUSCULAR | Status: AC
  Administered 2023-12-11: 18 mL via PERINEURAL

## 2023-12-11 MED ORDER — DEXAMETHASONE SODIUM PHOSPHATE 10 MG/ML IJ SOLN
20.0000 mg | Freq: Once | INTRAMUSCULAR | Status: AC
  Administered 2023-12-11: 20 mg

## 2023-12-11 MED ORDER — MIDAZOLAM HCL 2 MG/2ML IJ SOLN
0.5000 mg | Freq: Once | INTRAMUSCULAR | Status: AC
  Administered 2023-12-11: 2 mg via INTRAVENOUS

## 2023-12-11 MED ORDER — LIDOCAINE HCL 2 % IJ SOLN
INTRAMUSCULAR | Status: AC
  Filled 2023-12-11: qty 20

## 2023-12-11 NOTE — Progress Notes (Signed)
 PROVIDER NOTE: Interpretation of information contained herein should be left to medically-trained personnel. Specific patient instructions are provided elsewhere under "Patient Instructions" section of medical record. This document was created in part using STT-dictation technology, any transcriptional errors that may result from this process are unintentional.  Patient: Scott [name] Type: Established DOB: [birth year] MRN: [id number] PCP: Roselind Congo, MD  Service: Procedure DOS: 12/11/2023 Setting: Ambulatory Location: Ambulatory outpatient facility Delivery: Face-to-face Provider: Cephus Collin, MD Specialty: Interventional Pain Management Specialty designation: 09 Location: Outpatient facility Ref. Prov.: Cephus Collin, MD       Interventional Therapy   Procedure: Cervical and Thoracic facet medial branch block #1  Laterality: Bilateral (-50)  Level: C7, T1, T2, and T3 Medial Branch Level(s)  Imaging: Fluoroscopy-guided Spinal ([id number]) Anesthesia: Local anesthesia (1-2% Lidocaine ) Sedation: Minimal Sedation                       DOS: 12/11/2023  Performed by: Cephus Collin, MD  Purpose: Diagnostic/Therapeutic Indications: Thoracic back pain severe enough to impact quality of life or function. Rationale (medical necessity): procedure needed and proper for the diagnosis and/or treatment of Scott [name] medical symptoms and needs. 1. Cervical spondylosis   2. Cervical facet joint syndrome   3. Chronic pain syndrome   4. Thoracic facet joint syndrome    NAS-11 Pain score:   Pre-procedure: 4 /10   Post-procedure: 0-No pain/10     Target: Thoracic posterior (dorsal) primary rami nerve Location: 2.5 cm lateral to midline (spinous process), just cephalad to upper transverse process edge.  (needle tip placement) Region: Thoracic  Approach: Paravertebral  Type of procedure: Percutaneous perineural nerve block   Pertinent Anatomy: There are two potential fascial  compartments in the TPVS: the anterior extrapleural paravertebral compartment and the posterior subendothoracic paravertebral compartment. The transverse process and the superior costotransverse ligament form the posterior boundary.  Position / Prep / Materials:  Position: Prone  Prep solution: ChloraPrep (2% chlorhexidine  gluconate and 70% isopropyl alcohol) Prep Area: Entire upper back region Materials:  Tray: Block Needle(s):  Type: Spinal  Gauge (G): 22  Length: 3.5-in  Qty: 4  H&P (Pre-op Assessment):  Scott [name] is a 58 y.o. (year old), male patient, seen today for interventional treatment. He  has a past surgical history that includes Esophagogastroduodenoscopy (N/A, 09/29/2012); Colonoscopy (N/A, 09/29/2012); Fracture surgery; Hernia repair (2008); Esophagogastroduodenoscopy (egd) with propofol  (N/A, 06/22/2016); Colonoscopy with propofol  (N/A, 06/22/2016); Colonoscopy with propofol  (N/A, 05/20/2020); enteroscopy (N/A, 05/20/2020); Cataract extraction (Left); Knee arthroscopy (Left, 06/24/2020); Colonoscopy with propofol  (N/A, 03/12/2023); and biopsy (03/12/2023). Scott [name] has a current medication list which includes the following prescription(s): atenolol-chlorthalidone, atorvastatin, buspirone, colestipol , dapagliflozin pro-metformin  er, dorzolamide-timolol, iron, fluticasone , gabapentin , glucose blood, hydrocodone -acetaminophen , hydroxyzine, ibuprofen , levocetirizine, lisinopril, lorazepam, magnesium oxide, melatonin, microlet lancets, multivitamin with minerals, omeprazole, semaglutide, tizanidine, and [DISCONTINUED] liraglutide. His primarily concern today is the Neck Pain (Left )  Initial Vital Signs:  Pulse/HCG Rate: 83  Temp: 97.9 F (36.6 C) Resp: 16 BP: 128/88 SpO2: 99 %  BMI: Estimated body mass index is 35.31 kg/m as calculated from the following:   Height as of this encounter: 6\' 2"  (1.88 m).   Weight as of this encounter: 275 lb (124.7 kg).  Risk  Assessment: Allergies: Reviewed. He is allergic to lactose intolerance (gi), trazodone hcl, and trulicity [dulaglutide].  Allergy Precautions: None required Coagulopathies: Reviewed. None identified.  Blood-thinner therapy: None at this time Active Infection(s): Reviewed. None identified. Scott [name] is afebrile  Site Confirmation: [name] was asked to confirm the procedure and laterality before marking the site Procedure checklist: Completed Consent: Before the procedure and under the influence of no sedative(s), amnesic(s), or anxiolytics, the patient was informed of the treatment options, risks and possible complications. To fulfill our ethical and legal obligations, as recommended by the American Medical Association's Code of Ethics, I have informed the patient of my clinical impression; the nature and purpose of the treatment or procedure; the risks, benefits, and possible complications of the intervention; the alternatives, including doing nothing; the risk(s) and benefit(s) of the alternative treatment(s) or procedure(s); and the risk(s) and benefit(s) of doing nothing. The patient was provided information about the general risks and possible complications associated with the procedure. These may include, but are not limited to: failure to achieve desired goals, infection, bleeding, organ or nerve damage, allergic reactions, paralysis, and death. In addition, the patient was informed of those risks and complications associated to Spine-related procedures, such as failure to decrease pain; infection (i.e.: Meningitis, epidural or intraspinal abscess); bleeding (i.e.: epidural hematoma, subarachnoid hemorrhage, or any other type of intraspinal or peri-dural bleeding); organ or nerve damage (i.e.: Any type of peripheral nerve, nerve root, or spinal cord injury) with subsequent damage to sensory, motor, and/or autonomic systems, resulting in permanent pain, numbness, and/or weakness of one or  several areas of the body; allergic reactions; (i.e.: anaphylactic reaction); and/or death. Furthermore, the patient was informed of those risks and complications associated with the medications. These include, but are not limited to: allergic reactions (i.e.: anaphylactic or anaphylactoid reaction(s)); adrenal axis suppression; blood sugar elevation that in diabetics may result in ketoacidosis or comma; water retention that in patients with history of congestive heart failure may result in shortness of breath, pulmonary edema, and decompensation with resultant heart failure; weight gain; swelling or edema; medication-induced neural toxicity; particulate matter embolism and blood vessel occlusion with resultant organ, and/or nervous system infarction; and/or aseptic necrosis of one or more joints. Finally, the patient was informed that Medicine is not an exact science; therefore, there is also the possibility of unforeseen or unpredictable risks and/or possible complications that may result in a catastrophic outcome. The patient indicated having understood very clearly. We have given the patient no guarantees and we have made no promises. Enough time was given to the patient to ask questions, all of which were answered to the patient's satisfaction. Scott [name] has indicated that he wanted to continue with the procedure. Attestation: I, the ordering provider, attest that I have discussed with the patient the benefits, risks, side-effects, alternatives, likelihood of achieving goals, and potential problems during recovery for the procedure that I have provided informed consent. Date  Time: 12/11/2023  1:27 PM  Pre-Procedure Preparation:  Monitoring: As per clinic protocol. Respiration, ETCO2, SpO2, BP, heart rate and rhythm monitor placed and checked for adequate function Safety Precautions: Patient was assessed for positional comfort and pressure points before starting the procedure. Time-out: I initiated  and conducted the "Time-out" before starting the procedure, as per protocol. The patient was asked to participate by confirming the accuracy of the "Time Out" information. Verification of the correct person, site, and procedure were performed and confirmed by me, the nursing staff, and the patient. "Time-out" conducted as per Joint Commission's Universal Protocol (UP.01.01.01). Time: 1356 Start Time: 1356 hrs.  Description/Narrative of Procedure:          Start Time: 1356 hrs. Technical description of procedure:  Rationale (medical necessity): procedure needed and proper for the  diagnosis and/or treatment of the patient's medical symptoms and needs. Procedural Technique Safety Precautions: Aspiration looking for blood return was conducted prior to all injections. At no point did we inject any substances, as a needle was being advanced. No attempts were made at seeking any paresthesias. Safe injection practices and needle disposal techniques used. Medications properly checked for expiration dates. SDV (single dose vial) medications used. Target Area: For the thoracic medial branch nerve, the target is located between the top of the transverse processes at the point where the transverse process joints the vertebra and the superior-lateral aspect of the transverse process.  (More lateral towards the superior aspect of the thoracic spine and for C7.) For safety reasons and to minimize the risk of hemo- or pneumothorax, the needle tip was not advanced past the boundary of the posterior paravertebral compartment (superior costotransverse ligament). Description of the Procedure: Protocol guidelines were followed. The patient was placed in position over the fluoroscopy table. The target area was identified and the area prepped in the usual manner. Skin & deeper tissues infiltrated with local anesthetic. Appropriate amount of time allowed to pass for local anesthetics to take effect. The procedure needles were then  advanced to the target area. Proper needle placement secured. Negative aspiration confirmed. Solution injected in intermittent fashion, asking for systemic symptoms every 0.5cc of injectate. The needles were then removed and the area cleansed, making sure to leave some of the prepping solution back to take advantage of its long term bactericidal properties.  2 cc of nerve block solution injected at each level above             Facet Joint Innervation  T1-2 C8, T1  Medial Branch  T2-3 T1, T2         "          "  T3-4 T2, T3         "          "  T4-5 T3, T4         "          "  T5-6 T4, T5         "          "  T6-7 T5, T6         "          "  T7-8 T6, T7         "          "  T8-9 T7, T8         "          "  T9-10 T8, T9         "          "  T10-11 T9, T10         "          "  T11-12 T10, T11         "          "  T12-L1 T11, T12         "          "    Vitals:   12/11/23 1355 12/11/23 1400 12/11/23 1405 12/11/23 1409  BP: (!) 120/94 (!) 125/102 (!) 126/102 115/89  Pulse: 84 85 84 83  Resp: 16 16 15 16   Temp:      TempSrc:      SpO2: 100% 99% 100% 98%  Weight:  Height:         End Time: 1402 hrs.  Imaging Guidance (Spinal):          Type of Imaging Technique: Fluoroscopy Guidance (Spinal) Indication(s): Fluoroscopy guidance for needle placement to enhance accuracy in procedures requiring precise needle localization for targeted delivery of medication in or near specific anatomical locations not easily accessible without such real-time imaging assistance. Exposure Time: Please see nurses notes. Contrast: None used. Fluoroscopic Guidance: I was personally present during the use of fluoroscopy. "Tunnel Vision Technique" used to obtain the best possible view of the target area. Parallax error corrected before commencing the procedure. "Direction-depth-direction" technique used to introduce the needle under continuous pulsed fluoroscopy. Once target was reached,  antero-posterior, oblique, and lateral fluoroscopic projection used confirm needle placement in all planes. Images permanently stored in EMR. Interpretation: No contrast injected. I personally interpreted the imaging intraoperatively. Adequate needle placement confirmed in multiple planes. Permanent images saved into the patient's record.  Post-operative Assessment:  Post-procedure Vital Signs:  Pulse/HCG Rate: 83  Temp: 97.9 F (36.6 C) Resp: 16 BP: 115/89 SpO2: 98 %  EBL: None  Complications: No immediate post-treatment complications observed by team, or reported by patient.  Note: The patient tolerated the entire procedure well. A repeat set of vitals were taken after the procedure and the patient was kept under observation following institutional policy, for this type of procedure. Post-procedural neurological assessment was performed, showing return to baseline, prior to discharge. The patient was provided with post-procedure discharge instructions, including a section on how to identify potential problems. Should any problems arise concerning this procedure, the patient was given instructions to immediately contact us , at any time, without hesitation. In any case, we plan to contact the patient by telephone for a follow-up status report regarding this interventional procedure.  Comments:  No additional relevant information.  Plan of Care (POC)  Orders:  Orders Placed This Encounter  Procedures   DG PAIN CLINIC C-ARM 1-60 MIN NO REPORT    Intraoperative interpretation by procedural physician at [facility].    Standing Status:   Standing    Number of Occurrences:   1    Reason for exam::   Assistance in needle guidance and placement for procedures requiring needle placement in or near specific anatomical locations not easily accessible without such assistance.    Medications ordered for procedure: Meds ordered this encounter  Medications   lidocaine  (XYLOCAINE ) 2 %  (with pres) injection 400 mg   midazolam  (VERSED ) injection 0.5-2 mg    Make sure Flumazenil is available in the pyxis when using this medication. If oversedation occurs, administer 0.2 mg IV over 15 sec. If after 45 sec no response, administer 0.2 mg again over 1 min; may repeat at 1 min intervals; not to exceed 4 doses (1 mg)   lactated ringers  infusion   ropivacaine  (PF) 2 mg/mL (0.2%) (NAROPIN ) injection 18 mL   dexamethasone  (DECADRON ) injection 20 mg   Medications administered: We administered lidocaine , midazolam , lactated ringers , ropivacaine  (PF) 2 mg/mL (0.2%), and dexamethasone .  See the medical record for exact dosing, route, and time of administration.  Follow-up plan:   Return in about 3 weeks (around 01/01/2024) for PPE, F2F.       B/L C7, T1-2-3 MBNB 12/11/23    Recent Visits Date Type Provider Dept  11/21/23 Office Visit Cephus Collin, MD [facility]  Showing recent visits within past 90 days and meeting all other requirements Today's Visits Date Type Provider  Dept  12/11/23 Procedure visit Cephus Collin, MD [facility]  Showing today's visits and meeting all other requirements Future Appointments Date Type Provider Dept  12/26/23 Appointment Cephus Collin, MD [facility]  Showing future appointments within next 90 days and meeting all other requirements  Disposition: Discharge home  Discharge (Date  Time): 12/11/2023; 1416 hrs.   Primary Care Physician: Roselind Congo, MD Location: [facility] Note by: Cephus Collin, MD (TTS technology used. I apologize for any typographical errors that were not detected and corrected.) Date: 12/11/2023; Time: 2:21 PM  Disclaimer:  Medicine is not an Visual merchandiser. The only guarantee in medicine is that nothing is guaranteed. It is important to note that the decision to proceed with this intervention was based on the information collected from the patient. The Data and  conclusions were drawn from the patient's questionnaire, the interview, and the physical examination. Because the information was provided in large part by the patient, it cannot be guaranteed that it has not been purposely or unconsciously manipulated. Every effort has been made to obtain as much relevant data as possible for this evaluation. It is important to note that the conclusions that lead to this procedure are derived in large part from the available data. Always take into account that the treatment will also be dependent on availability of resources and existing treatment guidelines, considered by other Pain Management Practitioners as being common knowledge and practice, at the time of the intervention. For Medico-Legal purposes, it is also important to point out that variation in procedural techniques and pharmacological choices are the acceptable norm. The indications, contraindications, technique, and results of the above procedure should only be interpreted and judged by a Board-Certified Interventional Pain Specialist with extensive familiarity and expertise in the same exact procedure and technique.

## 2023-12-11 NOTE — Progress Notes (Signed)
 Safety precautions to be maintained throughout the outpatient stay will include: orient to surroundings, keep bed in low position, maintain call bell within reach at all times, provide assistance with transfer out of bed and ambulation.

## 2023-12-11 NOTE — Patient Instructions (Signed)

## 2023-12-12 ENCOUNTER — Telehealth: Payer: Self-pay

## 2023-12-12 NOTE — Telephone Encounter (Signed)
 No issues post-procedure.

## 2023-12-24 ENCOUNTER — Institutional Professional Consult (permissible substitution): Payer: 59 | Admitting: Plastic Surgery

## 2023-12-26 ENCOUNTER — Ambulatory Visit
Attending: Student in an Organized Health Care Education/Training Program | Admitting: Student in an Organized Health Care Education/Training Program

## 2023-12-26 ENCOUNTER — Encounter: Payer: Self-pay | Admitting: Student in an Organized Health Care Education/Training Program

## 2023-12-26 VITALS — BP 130/92 | HR 88 | Temp 97.3°F | Resp 16 | Ht 74.0 in | Wt 275.0 lb

## 2023-12-26 DIAGNOSIS — M47812 Spondylosis without myelopathy or radiculopathy, cervical region: Secondary | ICD-10-CM | POA: Insufficient documentation

## 2023-12-26 DIAGNOSIS — G894 Chronic pain syndrome: Secondary | ICD-10-CM | POA: Diagnosis not present

## 2023-12-26 DIAGNOSIS — M47894 Other spondylosis, thoracic region: Secondary | ICD-10-CM | POA: Insufficient documentation

## 2023-12-26 NOTE — Progress Notes (Signed)
 PROVIDER NOTE: Interpretation of information contained herein should be left to medically-trained personnel. Specific patient instructions are provided elsewhere under "Patient Instructions" section of medical record. This document was created in part using AI and STT-dictation technology, any transcriptional errors that may result from this process are unintentional.  Patient: Scott [name]  Service: E/M   PCP: Roselind Congo, MD  DOB: 1965/11/17  DOS: 12/26/2023  Provider: Cephus Collin, MD  MRN: [id number]  Delivery: Face-to-face  Specialty: Interventional Pain Management  Type: Established Patient  Setting: Ambulatory outpatient facility  Specialty designation: 09  Referring Prov.: Roselind Congo, MD  Location: Outpatient office facility       History of present illness (HPI) [name], a 58 y.o. year old male, is here today because of his Cervical spondylosis [M47.812]. Mr. Wegman primary complain today is Neck Pain  Pertinent problems: Mr. Finelli does not have any pertinent problems on file. Pain Assessment: Severity of Chronic pain is reported as a 2 /10. Location: Neck Left/left shoulder. Onset: More than a month ago. Quality: Aching, Discomfort. Timing: Constant. Modifying factor(s): proceduresand medications. Vitals:  height is 6\' 2"  (1.88 m) and weight is 275 lb (124.7 kg). His temporal temperature is 97.3 F (36.3 C) (abnormal). His blood pressure is 130/92 (abnormal) and his pulse is 88. His respiration is 16 and oxygen saturation is 100%.  BMI: Estimated body mass index is 35.31 kg/m as calculated from the following:   Height as of this encounter: 6\' 2"  (1.88 m).   Weight as of this encounter: 275 lb (124.7 kg). Last encounter: 11/21/2023. Last procedure: 12/11/2023.  Reason for encounter: post-procedure evaluation and assessment.   Discussed the use of AI scribe software for clinical note transcription with the patient, who gave verbal consent to  proceed.  History of Present Illness   Scott [name] is a 58 year old male who presents with neck pain following a diagnostic nerve block injection.  After receiving a diagnostic nerve block injection in his neck, he experienced soreness for a couple of days but was pain-free thereafter. However, the pain has started to return slightly, though it is not severe. He is still experiencing more than 70% pain relief from the injection.  He is currently not taking any pain medication as he has been feeling better. He notes that his blood sugar levels increased slightly after the injection, and he is diabetic. He is not on any blood thinners.  He plans to travel to [place] in July to meet his son and tour several [place]. He mentions having had knee surgery for a torn meniscus and a successful injection in his lower spine, which alleviated knee pain that was related to his spine. He is prepared for extensive walking during his trip.        Post-procedure evaluation  Procedure: Cervical and Thoracic facet medial branch block #1  Laterality: Bilateral (-50)  Level: C7, T1, T2, and T3 Medial Branch Level(s)  Imaging: Fluoroscopy-guided Spinal ([id number]) Anesthesia: Local anesthesia (1-2% Lidocaine ) Sedation: Minimal Sedation                       DOS: 12/11/2023  Performed by: Cephus Collin, MD  Purpose: Diagnostic/Therapeutic Indications: Thoracic back pain severe enough to impact quality of life or function. Rationale (medical necessity): procedure needed and proper for the diagnosis and/or treatment of Mr. Concannon medical symptoms and needs. 1. Cervical spondylosis   2. Cervical facet joint syndrome   3. Chronic pain  syndrome   4. Thoracic facet joint syndrome    NAS-11 Pain score:   Pre-procedure: 4 /10   Post-procedure: 0-No pain/10    Effectiveness:  Initial hour after procedure: 0 % Subsequent 4-6 hours post-procedure: 0 %  Analgesia past initial 6 hours: 100  % (for a few weeks at present still 85 percent)  Ongoing improvement:  Analgesic: 80% Function: Somewhat improved ROM: Somewhat improved   ROS  Constitutional: Denies any fever or chills Gastrointestinal: No reported hemesis, hematochezia, vomiting, or acute GI distress Musculoskeletal: Cervical and thoracic pain Neurological: No reported episodes of acute onset apraxia, aphasia, dysarthria, agnosia, amnesia, paralysis, loss of coordination, or loss of consciousness  Medication Review  Dapagliflozin Pro-metFORMIN  ER, HYDROcodone -acetaminophen , Iron, LORazepam, Melatonin, Microlet Lancets, Semaglutide, atenolol-chlorthalidone, atorvastatin, busPIRone, colestipol , dorzolamide-timolol, fluticasone , gabapentin , glucose blood, hydrOXYzine, ibuprofen , levocetirizine, liraglutide, lisinopril, magnesium oxide, multivitamin with minerals, omeprazole, and tiZANidine  History Review  Allergy: Mr. Keirsey is allergic to lactose intolerance (gi), trazodone hcl, and trulicity [dulaglutide]. Drug: Mr. Haji  reports that he does not currently use drugs after having used the following drugs: Marijuana. Alcohol:  reports current alcohol use. Tobacco:  reports that he has never smoked. He has never used smokeless tobacco. Social: Mr. Jiminez  reports that he has never smoked. He has never used smokeless tobacco. He reports current alcohol use. He reports that he does not currently use drugs after having used the following drugs: Marijuana. Medical:  has a past medical history of Anemia, Anxiety, Arthritis, Back pain, Diabetes mellitus without complication (HCC), Elevated cholesterol with elevated triglycerides, Elevated liver enzymes (2014), GERD (gastroesophageal reflux disease), Hypertension, Hypertension, and Sleep apnea. Surgical: Mr. Millea  has a past surgical history that includes Esophagogastroduodenoscopy (N/A, 09/29/2012); Colonoscopy (N/A, 09/29/2012); Fracture surgery; Hernia repair (2008);  Esophagogastroduodenoscopy (egd) with propofol  (N/A, 06/22/2016); Colonoscopy with propofol  (N/A, 06/22/2016); Colonoscopy with propofol  (N/A, 05/20/2020); enteroscopy (N/A, 05/20/2020); Cataract extraction (Left); Knee arthroscopy (Left, 06/24/2020); Colonoscopy with propofol  (N/A, 03/12/2023); and biopsy (03/12/2023). Family: family history includes Cancer in his father; Diabetes in his mother; Hyperlipidemia in his mother; Hypertension in his mother; Thyroid disease in his sister.  Laboratory Chemistry Profile   Renal Lab Results  Component Value Date   BUN 16 02/18/2023   CREATININE 0.71 02/18/2023   GFRNONAA >60 02/18/2023    Hepatic No results found for: "AST", "ALT", "ALBUMIN", "ALKPHOS", "HCVAB", "AMYLASE", "LIPASE", "AMMONIA"  Electrolytes Lab Results  Component Value Date   NA 135 02/18/2023   K 3.0 (L) 02/18/2023   CL 99 02/18/2023   CALCIUM 8.9 02/18/2023    Bone No results found for: "VD25OH", "VD125OH2TOT", "GN5621HY8", "MV7846NG2", "25OHVITD1", "25OHVITD2", "25OHVITD3", "TESTOFREE", "TESTOSTERONE"  Inflammation (CRP: Acute Phase) (ESR: Chronic Phase) No results found for: "CRP", "ESRSEDRATE", "LATICACIDVEN"       Note: Above Lab results reviewed.  Recent Imaging Review  MR CERVICAL SPINE WO CONTRAST   Narrative CLINICAL DATA:  Cervical radiculopathy   EXAM: MRI CERVICAL SPINE WITHOUT CONTRAST   TECHNIQUE: Multiplanar, multisequence MR imaging of the cervical spine was performed. No intravenous contrast was administered.   COMPARISON:  None Available.   FINDINGS: Alignment: No significant listhesis.   Vertebrae: No acute fracture, evidence of discitis, or suspicious osseous lesion.   Cord: Normal signal and morphology.   Posterior Fossa, vertebral arteries, paraspinal tissues: Negative.   Disc levels:   C2-C3: No significant disc bulge. No spinal canal stenosis or neuroforaminal narrowing.   C3-C4: Small central disc protrusion. Left greater than  right facet arthropathy. No spinal  canal stenosis. Mild left neural foraminal narrowing.   C4-C5: Mild right eccentric disc bulge with superimposed central disc protrusion with annular fissure, which abuts the ventral thecal sac and spinal cord. No spinal canal stenosis. Mild right neural foraminal narrowing.   C5-C6: Mild disc bulge with superimposed right subarticular protrusion, which indents the ventral thecal sac and spinal cord. No spinal canal stenosis. Mild right neural foraminal narrowing.   C6-C7: Mild disc bulge with superimposed left foraminal protrusion. No spinal canal stenosis. Severe left neural foraminal narrowing.   C7-T1: Minimal disc bulge. Left-greater-than-right uncovertebral hypertrophy. No spinal canal stenosis. Left neural foraminal narrowing.   IMPRESSION: 1. C6-C7 severe left neural foraminal narrowing. 2. C3-C4 mild left neural foraminal narrowing. 3. C4-C5 and C5-C6 mild right neural foraminal narrowing. 4. C7-T1 mild left neural foraminal narrowing. 5. No spinal canal stenosis.     Electronically Signed By: Zoila Hines M.D. On: 06/29/2023 02:33   DG Cervical Spine Complete   Narrative CLINICAL DATA:  new arm weakness   EXAM: CERVICAL SPINE - COMPLETE 5 VIEW   COMPARISON:  None Available.   FINDINGS: No fracture, dislocation or subluxation. No spondylolisthesis. No osteolytic or osteoblastic changes. Prevertebral and cervical cranial soft tissues are unremarkable. Motion with flexion to suggest   Degenerative disc disease noted with disc space narrowing and marginal osteophytes at C4-C7.   IMPRESSION: Degenerative changes. No acute osseous abnormalities.     Electronically Signed By: Sydell Eva M.D. On: 08/18/2023 22:48     DG Thoracic Spine 2 View   Narrative CLINICAL DATA:  Thoracic spine pain for 1 week   EXAM: THORACIC SPINE 2 VIEWS   COMPARISON:  None Available.   FINDINGS: Thoracic vertebral body heights  are preserved, without evidence of acute injury. There is mild S shaped scoliotic curvature. There is no antero or retrolisthesis. The disc heights appear preserved. There is no significant degenerative change.   The imaged heart and lungs unremarkable.   IMPRESSION: Mild S shaped scoliotic curvature.  No acute finding.     Note: Reviewed         Physical Exam  General appearance: Well nourished, well developed, and well hydrated. In no apparent acute distress Mental status: Alert, oriented x 3 (person, place, & time)       Respiratory: No evidence of acute respiratory distress Eyes: PERLA Vitals: BP (!) 130/92   Pulse 88   Temp (!) 97.3 F (36.3 C) (Temporal)   Resp 16   Ht 6\' 2"  (1.88 m)   Wt 275 lb (124.7 kg)   SpO2 100%   BMI 35.31 kg/m  BMI: Estimated body mass index is 35.31 kg/m as calculated from the following:   Height as of this encounter: 6\' 2"  (1.88 m).   Weight as of this encounter: 275 lb (124.7 kg). Ideal: Ideal body weight: 82.2 kg (181 lb 3.5 oz) Adjusted ideal body weight: 99.2 kg (218 lb 11.7 oz)  Cervical Spine Area Exam  Skin & Axial Inspection: No masses, redness, edema, swelling, or associated skin lesions Alignment: Symmetrical Functional ROM: Pain restricted ROM      Stability: No instability detected Muscle Tone/Strength: Functionally intact. No obvious neuro-muscular anomalies detected. Sensory (Neurological): Musculoskeletal pain pattern Palpation: No palpable anomalies             Upper Extremity (UE) Exam      Side: Right upper extremity   Side: Left upper extremity  Skin & Extremity Inspection: Skin color, temperature, and hair growth are  WNL. No peripheral edema or cyanosis. No masses, redness, swelling, asymmetry, or associated skin lesions. No contractures.   Skin & Extremity Inspection: Skin color, temperature, and hair growth are WNL. No peripheral edema or cyanosis. No masses, redness, swelling, asymmetry, or associated skin lesions.  No contractures.  Functional ROM: Unrestricted ROM           Functional ROM: Unrestricted ROM          Muscle Tone/Strength: Functionally intact. No obvious neuro-muscular anomalies detected.   Muscle Tone/Strength: Functionally intact. No obvious neuro-muscular anomalies detected.  Sensory (Neurological): Unimpaired           Sensory (Neurological): Unimpaired          Palpation: No palpable anomalies               Palpation: No palpable anomalies              Provocative Test(s):  Phalen's test: deferred Tinel's test: deferred Apley's scratch test (touch opposite shoulder):  Action 1 (Across chest): deferred Action 2 (Overhead): deferred Action 3 (LB reach): deferred     Provocative Test(s):  Phalen's test: deferred Tinel's test: deferred Apley's scratch test (touch opposite shoulder):  Action 1 (Across chest): deferred Action 2 (Overhead): deferred Action 3 (LB reach): deferred      5 out of 5 strength bilateral upper extremity: Shoulder abduction, elbow flexion, elbow extension, thumb extension.    Assessment   Diagnosis Status  1. Cervical spondylosis   2. Cervical facet joint syndrome   3. Chronic pain syndrome   4. Thoracic facet joint syndrome    Responding Responding Controlled   Updated Problems: No problems updated.  Plan of Care  Repeat diagnostic cervical and thoracic facet medial branch nerve blocks #2 then consider radiofrequency ablation thereafter.  Mr. Ryheem Jay has a current medication list which includes the following long-term medication(s): atenolol-chlorthalidone, atorvastatin, colestipol , gabapentin , levocetirizine, lisinopril, omeprazole, and [DISCONTINUED] liraglutide.  Pharmacotherapy (Medications Ordered): No orders of the defined types were placed in this encounter.  Orders:  Orders Placed This Encounter  Procedures   CERVICAL FACET (MEDIAL BRANCH NERVE BLOCK)     Standing Status:   Future    Expiration Date:   03/27/2024     Scheduling Instructions:     Procedure: Cervical facet Block     Type: Medial Branch Block     Side: Bilateral     Purpose: Diagnostic Radiologic Mapping #2     Level(s): C7-T1, T1-T2, T2-T3 and TBD by Fluoroscopic Pain Mapping Facet joints (C7, T1,T2, T3)      Sedation: IV Versed      Timeframe: As soon as schedule allows.    Where will this procedure be performed?:   [facility]   Follow-up plan:   Return in about 25 days (around 01/20/2024) for B/L C7, T1, T2, and T3 #2 , in clinic IV Versed .      B/L C7, T1-2-3 MBNB 12/11/23   Recent Visits Date Type Provider Dept  12/11/23 Procedure visit Cephus Collin, MD [facility]  11/21/23 Office Visit Cephus Collin, MD [facility]  Showing recent visits within past 90 days and meeting all other requirements Today's Visits Date Type Provider Dept  12/26/23 Office Visit Cephus Collin, MD [facility]  Showing today's visits and meeting all other requirements Future Appointments No visits were found meeting these conditions. Showing future appointments within next 90 days and meeting all other requirements   I discussed the assessment and  treatment plan with the patient. The patient was provided an opportunity to ask questions and all were answered. The patient agreed with the plan and demonstrated an understanding of the instructions.  Patient advised to call back or seek an in-person evaluation if the symptoms or condition worsens.  Duration of encounter: 20 minutes.  Total time on encounter, as per AMA guidelines included both the face-to-face and non-face-to-face time personally spent by the physician and/or other qualified health care professional(s) on the day of the encounter (includes time in activities that require the physician or other qualified health care professional and does not include time in activities normally performed by clinical staff). Physician's time may include the following  activities when performed: Preparing to see the patient (e.g., pre-charting review of records, searching for previously ordered imaging, lab work, and nerve conduction tests) Review of prior analgesic pharmacotherapies. Reviewing PMP Interpreting ordered tests (e.g., lab work, imaging, nerve conduction tests) Performing post-procedure evaluations, including interpretation of diagnostic procedures Obtaining and/or reviewing separately obtained history Performing a medically appropriate examination and/or evaluation Counseling and educating the patient/family/caregiver Ordering medications, tests, or procedures Referring and communicating with other health care professionals (when not separately reported) Documenting clinical information in the electronic or other health record Independently interpreting results (not separately reported) and communicating results to the patient/ family/caregiver Care coordination (not separately reported)  Note by: Cephus Collin, MD (TTS and AI technology used. I apologize for any typographical errors that were not detected and corrected.) Date: 12/26/2023; Time: 2:33 PM

## 2024-01-22 ENCOUNTER — Ambulatory Visit
Admission: RE | Admit: 2024-01-22 | Discharge: 2024-01-22 | Disposition: A | Discharge status: Home / Self Care | Source: Ambulatory Visit | Attending: Student in an Organized Health Care Education/Training Program | Admitting: Student in an Organized Health Care Education/Training Program

## 2024-01-22 ENCOUNTER — Ambulatory Visit (HOSPITAL_BASED_OUTPATIENT_CLINIC_OR_DEPARTMENT_OTHER): Admitting: Student in an Organized Health Care Education/Training Program

## 2024-01-22 ENCOUNTER — Encounter: Payer: Self-pay | Admitting: Student in an Organized Health Care Education/Training Program

## 2024-01-22 DIAGNOSIS — M47894 Other spondylosis, thoracic region: Secondary | ICD-10-CM | POA: Insufficient documentation

## 2024-01-22 DIAGNOSIS — G894 Chronic pain syndrome: Secondary | ICD-10-CM | POA: Diagnosis not present

## 2024-01-22 DIAGNOSIS — M47812 Spondylosis without myelopathy or radiculopathy, cervical region: Secondary | ICD-10-CM

## 2024-01-22 MED ORDER — MIDAZOLAM HCL 2 MG/2ML IJ SOLN
INTRAMUSCULAR | Status: AC
  Filled 2024-01-22: qty 2

## 2024-01-22 MED ORDER — DEXAMETHASONE SODIUM PHOSPHATE 10 MG/ML IJ SOLN
20.0000 mg | Freq: Once | INTRAMUSCULAR | Status: AC
  Administered 2024-01-22: 20 mg

## 2024-01-22 MED ORDER — MIDAZOLAM HCL 2 MG/2ML IJ SOLN
0.5000 mg | Freq: Once | INTRAMUSCULAR | Status: AC
  Administered 2024-01-22: 2 mg via INTRAVENOUS

## 2024-01-22 MED ORDER — LIDOCAINE HCL 2 % IJ SOLN
20.0000 mL | Freq: Once | INTRAMUSCULAR | Status: AC
  Administered 2024-01-22: 400 mg

## 2024-01-22 MED ORDER — DEXAMETHASONE SODIUM PHOSPHATE 10 MG/ML IJ SOLN
INTRAMUSCULAR | Status: AC
  Filled 2024-01-22: qty 2

## 2024-01-22 MED ORDER — ROPIVACAINE HCL 2 MG/ML IJ SOLN
INTRAMUSCULAR | Status: AC
  Filled 2024-01-22: qty 20

## 2024-01-22 MED ORDER — ROPIVACAINE HCL 2 MG/ML IJ SOLN
18.0000 mL | Freq: Once | INTRAMUSCULAR | Status: AC
  Administered 2024-01-22: 18 mL via PERINEURAL

## 2024-01-22 MED ORDER — LACTATED RINGERS IV SOLN: Freq: Once | INTRAVENOUS | Status: AC

## 2024-01-22 MED ORDER — LIDOCAINE HCL 2 % IJ SOLN
INTRAMUSCULAR | Status: AC
  Filled 2024-01-22: qty 20

## 2024-01-22 NOTE — Progress Notes (Signed)
 Safety precautions to be maintained throughout the outpatient stay will include: orient to surroundings, keep bed in low position, maintain call bell within reach at all times, provide assistance with transfer out of bed and ambulation.

## 2024-01-22 NOTE — Progress Notes (Signed)
 PROVIDER NOTE: Interpretation of information contained herein should be left to medically-trained personnel. Specific patient instructions are provided elsewhere under Patient Instructions section of medical record. This document was created in part using STT-dictation technology, any transcriptional errors that may result from this process are unintentional.  Patient: Scott [name] Type: Established DOB: 1965/09/09 MRN: [id number] PCP: Roselind Congo, MD  Service: Procedure DOS: 01/22/2024 Setting: Ambulatory Location: Ambulatory outpatient facility Delivery: Face-to-face Provider: Cephus Collin, MD Specialty: Interventional Pain Management Specialty designation: 09 Location: Outpatient facility Ref. Prov.: Cephus Collin, MD       Interventional Therapy   Procedure: Cervical and Thoracic facet medial branch block #2  Laterality: Bilateral (-50)  Level: C7, T1, T2, and T3 Medial Branch Level(s)  Imaging: Fluoroscopy-guided Spinal ([id number]) Anesthesia: Local anesthesia (1-2% Lidocaine ) Sedation: Minimal Sedation                       DOS: 01/22/2024  Performed by: Cephus Collin, MD  Purpose: Diagnostic/Therapeutic Indications: Thoracic back pain severe enough to impact quality of life or function. Rationale (medical necessity): procedure needed and proper for the diagnosis and/or treatment of Scott [name] medical symptoms and needs. 1. Cervical spondylosis   2. Cervical facet joint syndrome   3. Chronic pain syndrome   4. Thoracic facet joint syndrome    NAS-11 Pain score:   Pre-procedure: 5 /10   Post-procedure: 0-No pain/10     Target: Thoracic posterior (dorsal) primary rami nerve Location: 2.5 cm lateral to midline (spinous process), just cephalad to upper transverse process edge.  (needle tip placement) Region: Thoracic  Approach: Paravertebral  Type of procedure: Percutaneous perineural nerve block   Pertinent Anatomy: There are two potential fascial  compartments in the TPVS: the anterior extrapleural paravertebral compartment and the posterior subendothoracic paravertebral compartment. The transverse process and the superior costotransverse ligament form the posterior boundary.  Position / Prep / Materials:  Position: Prone  Prep solution: ChloraPrep (2% chlorhexidine  gluconate and 70% isopropyl alcohol) Prep Area: Entire upper back region Materials:  Tray: Block Needle(s):  Type: Spinal  Gauge (G): 22  Length: 3.5-in  Qty: 4  H&P (Pre-op Assessment):  Scott [name] is a 58 y.o. (year old), male patient, seen today for interventional treatment. He  has a past surgical history that includes Esophagogastroduodenoscopy (N/A, 09/29/2012); Colonoscopy (N/A, 09/29/2012); Fracture surgery; Hernia repair (2008); Esophagogastroduodenoscopy (egd) with propofol  (N/A, 06/22/2016); Colonoscopy with propofol  (N/A, 06/22/2016); Colonoscopy with propofol  (N/A, 05/20/2020); enteroscopy (N/A, 05/20/2020); Cataract extraction (Left); Knee arthroscopy (Left, 06/24/2020); Colonoscopy with propofol  (N/A, 03/12/2023); and biopsy (03/12/2023). Scott [name] has a current medication list which includes the following prescription(s): atenolol-chlorthalidone, atorvastatin, buspirone, colestipol , dapagliflozin pro-metformin  er, dorzolamide-timolol, iron, fluticasone , gabapentin , glucose blood, hydrocodone -acetaminophen , hydroxyzine, ibuprofen , levocetirizine, lisinopril, lorazepam, magnesium oxide, melatonin, microlet lancets, multivitamin with minerals, omeprazole, semaglutide, tizanidine, and [DISCONTINUED] liraglutide. His primarily concern today is the Back Pain (Cervical/thoracic)  Initial Vital Signs:  Pulse/HCG Rate: 85ECG Heart Rate: 87 Temp: (!) 97.3 F (36.3 C) Resp: 16 BP: 119/85 SpO2: 98 %  BMI: Estimated body mass index is 34.28 kg/m as calculated from the following:   Height as of this encounter: 6' 2 (1.88 m).   Weight as of this encounter: 267 lb  (121.1 kg).  Risk Assessment: Allergies: Reviewed. He is allergic to lactose intolerance (gi), trazodone hcl, and trulicity [dulaglutide].  Allergy Precautions: None required Coagulopathies: Reviewed. None identified.  Blood-thinner therapy: None at this time Active Infection(s): Reviewed. None identified. Scott [name] is afebrile  Site  Confirmation: Scott [name] was asked to confirm the procedure and laterality before marking the site Procedure checklist: Completed Consent: Before the procedure and under the influence of no sedative(s), amnesic(s), or anxiolytics, the patient was informed of the treatment options, risks and possible complications. To fulfill our ethical and legal obligations, as recommended by the American Medical Association's Code of Ethics, I have informed the patient of my clinical impression; the nature and purpose of the treatment or procedure; the risks, benefits, and possible complications of the intervention; the alternatives, including doing nothing; the risk(s) and benefit(s) of the alternative treatment(s) or procedure(s); and the risk(s) and benefit(s) of doing nothing. The patient was provided information about the general risks and possible complications associated with the procedure. These may include, but are not limited to: failure to achieve desired goals, infection, bleeding, organ or nerve damage, allergic reactions, paralysis, and death. In addition, the patient was informed of those risks and complications associated to Spine-related procedures, such as failure to decrease pain; infection (i.e.: Meningitis, epidural or intraspinal abscess); bleeding (i.e.: epidural hematoma, subarachnoid hemorrhage, or any other type of intraspinal or peri-dural bleeding); organ or nerve damage (i.e.: Any type of peripheral nerve, nerve root, or spinal cord injury) with subsequent damage to sensory, motor, and/or autonomic systems, resulting in permanent pain, numbness, and/or  weakness of one or several areas of the body; allergic reactions; (i.e.: anaphylactic reaction); and/or death. Furthermore, the patient was informed of those risks and complications associated with the medications. These include, but are not limited to: allergic reactions (i.e.: anaphylactic or anaphylactoid reaction(s)); adrenal axis suppression; blood sugar elevation that in diabetics may result in ketoacidosis or comma; water retention that in patients with history of congestive heart failure may result in shortness of breath, pulmonary edema, and decompensation with resultant heart failure; weight gain; swelling or edema; medication-induced neural toxicity; particulate matter embolism and blood vessel occlusion with resultant organ, and/or nervous system infarction; and/or aseptic necrosis of one or more joints. Finally, the patient was informed that Medicine is not an exact science; therefore, there is also the possibility of unforeseen or unpredictable risks and/or possible complications that may result in a catastrophic outcome. The patient indicated having understood very clearly. We have given the patient no guarantees and we have made no promises. Enough time was given to the patient to ask questions, all of which were answered to the patient's satisfaction. Scott [name] has indicated that he wanted to continue with the procedure. Attestation: I, the ordering provider, attest that I have discussed with the patient the benefits, risks, side-effects, alternatives, likelihood of achieving goals, and potential problems during recovery for the procedure that I have provided informed consent. Date  Time: 01/22/2024  2:04 PM  Pre-Procedure Preparation:  Monitoring: As per clinic protocol. Respiration, ETCO2, SpO2, BP, heart rate and rhythm monitor placed and checked for adequate function Safety Precautions: Patient was assessed for positional comfort and pressure points before starting the  procedure. Time-out: I initiated and conducted the Time-out before starting the procedure, as per protocol. The patient was asked to participate by confirming the accuracy of the Time Out information. Verification of the correct person, site, and procedure were performed and confirmed by me, the nursing staff, and the patient. Time-out conducted as per Joint Commission's Universal Protocol (UP.01.01.01). Time: 1426 Start Time: 1426 hrs.  Description/Narrative of Procedure:          Start Time: 1426 hrs. Technical description of procedure:  Rationale (medical necessity): procedure needed and proper  for the diagnosis and/or treatment of the patient's medical symptoms and needs. Procedural Technique Safety Precautions: Aspiration looking for blood return was conducted prior to all injections. At no point did we inject any substances, as a needle was being advanced. No attempts were made at seeking any paresthesias. Safe injection practices and needle disposal techniques used. Medications properly checked for expiration dates. SDV (single dose vial) medications used. Target Area: For the thoracic medial branch nerve, the target is located between the top of the transverse processes at the point where the transverse process joints the vertebra and the superior-lateral aspect of the transverse process.  (More lateral towards the superior aspect of the thoracic spine and for C7.) For safety reasons and to minimize the risk of hemo- or pneumothorax, the needle tip was not advanced past the boundary of the posterior paravertebral compartment (superior costotransverse ligament). Description of the Procedure: Protocol guidelines were followed. The patient was placed in position over the fluoroscopy table. The target area was identified and the area prepped in the usual manner. Skin & deeper tissues infiltrated with local anesthetic. Appropriate amount of time allowed to pass for local anesthetics to take  effect. The procedure needles were then advanced to the target area. Proper needle placement secured. Negative aspiration confirmed. Solution injected in intermittent fashion, asking for systemic symptoms every 0.5cc of injectate. The needles were then removed and the area cleansed, making sure to leave some of the prepping solution back to take advantage of its long term bactericidal properties.  2 cc of nerve block solution injected at each level above             Facet Joint Innervation  T1-2 C8, T1  Medial Branch  T2-3 T1, T2                     T3-4 T2, T3                     T4-5 T3, T4                     T5-6 T4, T5                     T6-7 T5, T6                     T7-8 T6, T7                     T8-9 T7, T8                     T9-10 T8, T9                     T10-11 T9, T10                     T11-12 T10, T11                     T12-L1 T11, T12                       Vitals:   01/22/24 1424 01/22/24 1430 01/22/24 1435 01/22/24 1441  BP: 122/84 (!) 109/93 121/85 120/75  Pulse:      Resp: 18 17 18 16   Temp:    (!) 97.1 F (36.2 C)  SpO2: 98% 95% 95% 98%  Weight:  Height:         End Time: 1435 hrs.  Imaging Guidance (Spinal):          Type of Imaging Technique: Fluoroscopy Guidance (Spinal) Indication(s): Fluoroscopy guidance for needle placement to enhance accuracy in procedures requiring precise needle localization for targeted delivery of medication in or near specific anatomical locations not easily accessible without such real-time imaging assistance. Exposure Time: Please see nurses notes. Contrast: None used. Fluoroscopic Guidance: I was personally present during the use of fluoroscopy. Tunnel Vision Technique used to obtain the best possible view of the target area. Parallax error corrected before commencing the procedure. Direction-depth-direction technique used to introduce the needle under continuous pulsed fluoroscopy.  Once target was reached, antero-posterior, oblique, and lateral fluoroscopic projection used confirm needle placement in all planes. Images permanently stored in EMR. Interpretation: No contrast injected. I personally interpreted the imaging intraoperatively. Adequate needle placement confirmed in multiple planes. Permanent images saved into the patient's record.  Post-operative Assessment:  Post-procedure Vital Signs:  Pulse/HCG Rate: 8589 Temp: (!) 97.1 F (36.2 C) Resp: 16 BP: 120/75 SpO2: 98 %  EBL: None  Complications: No immediate post-treatment complications observed by team, or reported by patient.  Note: The patient tolerated the entire procedure well. A repeat set of vitals were taken after the procedure and the patient was kept under observation following institutional policy, for this type of procedure. Post-procedural neurological assessment was performed, showing return to baseline, prior to discharge. The patient was provided with post-procedure discharge instructions, including a section on how to identify potential problems. Should any problems arise concerning this procedure, the patient was given instructions to immediately contact us , at any time, without hesitation. In any case, we plan to contact the patient by telephone for a follow-up status report regarding this interventional procedure.  Comments:  No additional relevant information.  Plan of Care (POC)  Orders:  Orders Placed This Encounter  Procedures   DG PAIN CLINIC C-ARM 1-60 MIN NO REPORT    Intraoperative interpretation by procedural physician at [facility].    Standing Status:   Standing    Number of Occurrences:   1    Reason for exam::   Assistance in needle guidance and placement for procedures requiring needle placement in or near specific anatomical locations not easily accessible without such assistance.    Medications ordered for procedure: Meds ordered this encounter  Medications    lidocaine  (XYLOCAINE ) 2 % (with pres) injection 400 mg   lactated ringers  infusion   midazolam  (VERSED ) injection 0.5-2 mg    Make sure Flumazenil is available in the pyxis when using this medication. If oversedation occurs, administer 0.2 mg IV over 15 sec. If after 45 sec no response, administer 0.2 mg again over 1 min; may repeat at 1 min intervals; not to exceed 4 doses (1 mg)   ropivacaine  (PF) 2 mg/mL (0.2%) (NAROPIN ) injection 18 mL   dexamethasone  (DECADRON ) injection 20 mg   Medications administered: We administered lidocaine , lactated ringers , midazolam , ropivacaine  (PF) 2 mg/mL (0.2%), and dexamethasone .  See the medical record for exact dosing, route, and time of administration.  Follow-up plan:   Return in about 5 weeks (around 02/26/2024) for F2F PPE.       B/L C7, T1-2-3 MBNB 12/11/23, 01/22/24    Recent Visits Date Type Provider Dept  12/26/23 Office Visit Cephus Collin, MD [facility]  12/11/23 Procedure visit Cephus Collin, MD [facility]  11/21/23 Office Visit Cephus Collin, MD  [facility]  Showing recent visits within past 90 days and meeting all other requirements Today's Visits Date Type Provider Dept  01/22/24 Procedure visit Cephus Collin, MD [facility]  Showing today's visits and meeting all other requirements Future Appointments Date Type Provider Dept  02/27/24 Appointment Cephus Collin, MD [facility]  Showing future appointments within next 90 days and meeting all other requirements  Disposition: Discharge home  Discharge (Date  Time): 01/22/2024; 1453 hrs.   Primary Care Physician: Roselind Congo, MD Location: [facility] Note by: Cephus Collin, MD (TTS technology used. I apologize for any typographical errors that were not detected and corrected.) Date: 01/22/2024; Time: 3:40 PM  Disclaimer:  Medicine is not an Visual merchandiser. The only guarantee in medicine is that  nothing is guaranteed. It is important to note that the decision to proceed with this intervention was based on the information collected from the patient. The Data and conclusions were drawn from the patient's questionnaire, the interview, and the physical examination. Because the information was provided in large part by the patient, it cannot be guaranteed that it has not been purposely or unconsciously manipulated. Every effort has been made to obtain as much relevant data as possible for this evaluation. It is important to note that the conclusions that lead to this procedure are derived in large part from the available data. Always take into account that the treatment will also be dependent on availability of resources and existing treatment guidelines, considered by other Pain Management Practitioners as being common knowledge and practice, at the time of the intervention. For Medico-Legal purposes, it is also important to point out that variation in procedural techniques and pharmacological choices are the acceptable norm. The indications, contraindications, technique, and results of the above procedure should only be interpreted and judged by a Board-Certified Interventional Pain Specialist with extensive familiarity and expertise in the same exact procedure and technique.

## 2024-01-22 NOTE — Patient Instructions (Signed)

## 2024-01-23 ENCOUNTER — Telehealth: Payer: Self-pay

## 2024-01-23 NOTE — Telephone Encounter (Signed)
 No issues post-procedure.

## 2024-01-24 DIAGNOSIS — G4733 Obstructive sleep apnea (adult) (pediatric): Secondary | ICD-10-CM | POA: Diagnosis not present

## 2024-01-27 ENCOUNTER — Ambulatory Visit
Admission: RE | Admit: 2024-01-27 | Discharge: 2024-01-27 | Disposition: A | Discharge status: Home / Self Care | Source: Ambulatory Visit | Attending: Family Medicine | Admitting: Family Medicine

## 2024-01-27 VITALS — BP 121/79 | HR 82 | Temp 98.4°F | Resp 20

## 2024-01-27 DIAGNOSIS — J208 Acute bronchitis due to other specified organisms: Secondary | ICD-10-CM | POA: Diagnosis not present

## 2024-01-27 LAB — POCT RAPID STREP A (OFFICE): Rapid Strep A Screen: NEGATIVE

## 2024-01-27 MED ORDER — FLUTICASONE PROPIONATE 50 MCG/ACT NA SUSP: 1.0000 | Freq: Two times a day (BID) | NASAL | 2 refills | Status: AC

## 2024-01-27 MED ORDER — PREDNISONE 20 MG PO TABS: 40.0000 mg | ORAL_TABLET | Freq: Every day | ORAL | 0 refills | Status: DC

## 2024-01-27 MED ORDER — PROMETHAZINE-DM 6.25-15 MG/5ML PO SYRP: 5.0000 mL | ORAL_SOLUTION | Freq: Four times a day (QID) | ORAL | 0 refills | Status: DC | PRN

## 2024-01-27 NOTE — ED Provider Notes (Addendum)
 [facility]    CSN: [id number] Arrival date & time: 01/27/24  1407      History   Chief Complaint Chief Complaint  Patient presents with   Sore Throat    Sore throat, chest congestion, productive cough, bodyaches, and fatigue x 8 days. - Entered by patient    HPI Scott [name] is a 58 y.o. male.   Presenting today with 9-day history of sore throat, hoarse voice, hacking productive cough, fatigue.  Denies chest pain, shortness of breath, abdominal pain, vomiting, diarrhea.  So far trying ibuprofen  with minimal relief.  History of seasonal allergies on as needed antihistamines.    Past Medical History:  Diagnosis Date   Anemia    Anxiety    Arthritis    Back pain    pinched nerve sciatica   Diabetes mellitus without complication (HCC)    type  2   Elevated cholesterol with elevated triglycerides    Elevated liver enzymes 2014   normal now   GERD (gastroesophageal reflux disease)    Hypertension    Hypertension    Sleep apnea    uses cpap,setting of 14    Patient Active Problem List   Diagnosis Date Noted   Gynecomastia, male 11/21/2023   Severe back pain 11/21/2023   Cervical facet joint syndrome 11/21/2023   Chronic pain syndrome 11/21/2023   Lactose intolerance 10/28/2023   Abnormal liver function tests 09/02/2023   Change in bowel habit 09/02/2023   Diverticular disease of colon 09/02/2023   Gastro-esophageal reflux disease without esophagitis 09/02/2023   Iron deficiency anemia 09/02/2023   Morbid obesity (HCC) 09/02/2023   Right wrist pain 08/28/2023   Cervical disc disorder at C6-C7 level with radiculopathy 08/12/2023   Spinal stenosis in cervical region 08/12/2023   Left arm weakness 08/12/2023   Severe sleep apnea 05/02/2023   Insomnia 05/02/2023   Hemorrhoids 04/22/2023   IBS (irritable bowel syndrome) 12/24/2022   Palpitations 02/21/2016    Past Surgical History:  Procedure Laterality Date   BIOPSY  03/12/2023   Procedure:  BIOPSY;  Surgeon: Eartha Angelia Sieving, MD;  Location: [facility];  Service: Gastroenterology;;   CATARACT EXTRACTION Left    COLONOSCOPY N/A 09/29/2012   Procedure: COLONOSCOPY;  Surgeon: Renaye Sous, MD;  Location: [facility];  Service: Endoscopy;  Laterality: N/A;   COLONOSCOPY WITH PROPOFOL  N/A 06/22/2016   Procedure: COLONOSCOPY WITH PROPOFOL ;  Surgeon: Belvie Just, MD;  Location: [facility];  Service: Endoscopy;  Laterality: N/A;   COLONOSCOPY WITH PROPOFOL  N/A 05/20/2020   Procedure: COLONOSCOPY WITH PROPOFOL ;  Surgeon: Just Belvie, MD;  Location: [facility];  Service: Endoscopy;  Laterality: N/A;   COLONOSCOPY WITH PROPOFOL  N/A 03/12/2023   Procedure: COLONOSCOPY WITH PROPOFOL ;  Surgeon: Eartha Angelia Sieving, MD;  Location: [facility];  Service: Gastroenterology;  Laterality: N/A;  11:00am;asa 3, can be done in Room 1 if rescheduled soon 7/15 cy   ENTEROSCOPY N/A 05/20/2020   Procedure: ENTEROSCOPY;  Surgeon: Just Belvie, MD;  Location: [facility];  Service: Endoscopy;  Laterality: N/A;   ESOPHAGOGASTRODUODENOSCOPY N/A 09/29/2012   Procedure: ESOPHAGOGASTRODUODENOSCOPY (EGD);  Surgeon: Renaye Sous, MD;  Location: [facility];  Service: Endoscopy;  Laterality: N/A;   ESOPHAGOGASTRODUODENOSCOPY (EGD) WITH PROPOFOL  N/A 06/22/2016   Procedure: ESOPHAGOGASTRODUODENOSCOPY (EGD) WITH PROPOFOL ;  Surgeon: Belvie Just, MD;  Location: [facility];  Service: Endoscopy;  Laterality: N/A;   FRACTURE SURGERY     right wrist with screws   HERNIA REPAIR  2008   inguinal  KNEE ARTHROSCOPY Left 06/24/2020   Procedure: ARTHROSCOPY KNEE WITH PARTIAL MEDIAL MENISCECTOMY, CHONDROPLASTY,  OPEN CYSTECTOMY;  Surgeon: Yvone Rush, MD;  Location: [facility];  Service: Orthopedics;  Laterality: Left;       Home Medications    Prior to Admission medications   Medication Sig Start Date End Date Taking? Authorizing Provider  fluticasone  (FLONASE ) 50 MCG/ACT nasal spray Place 1 spray  into both nostrils 2 (two) times daily. 01/27/24  Yes Stuart Vernell Norris, PA-C  predniSONE  (DELTASONE ) 20 MG tablet Take 2 tablets (40 mg total) by mouth daily with breakfast. 01/27/24  Yes Stuart Vernell Norris, PA-C  promethazine -dextromethorphan (PROMETHAZINE -DM) 6.25-15 MG/5ML syrup Take 5 mLs by mouth 4 (four) times daily as needed. 01/27/24  Yes Stuart Vernell Norris, PA-C  atenolol-chlorthalidone (TENORETIC) 100-25 MG per tablet Take 1 tablet by mouth daily.    [provider]  atorvastatin (LIPITOR) 20 MG tablet Take 20 mg by mouth at bedtime.    [provider]  busPIRone (BUSPAR) 10 MG tablet Take 10 mg by mouth 3 (three) times daily. 07/07/15   [provider]  colestipol  (COLESTID ) 1 g tablet Take 1 tablet (1 g total) by mouth daily. Take 4 hours apart from other medications 09/30/23   Eartha Flavors, [place], MD  Dapagliflozin Pro-metFORMIN  ER (XIGDUO XR) 05-999 MG TB24 Take 1 tablet by mouth daily.    [provider]  dorzolamide-timolol (COSOPT) 2-0.5 % ophthalmic solution Place 1 drop into the left eye 2 (two) times daily.    [provider]  Ferrous Sulfate (IRON) 28 MG TABS Take 28 mg by mouth daily.    [provider]  fluticasone  (FLONASE ) 50 MCG/ACT nasal spray Place 1 spray into both nostrils daily. 07/09/23   [provider]  gabapentin  (NEURONTIN ) 300 MG capsule Take 1 capsule (300 mg total) by mouth 2 (two) times daily. 11/05/23   Claudene Penne ORN, MD  glucose blood test strip Use to check blood sugar once a day. 07/02/16   Trixie File, MD  HYDROcodone -acetaminophen  (NORCO) 10-325 MG tablet Take 1 tablet by mouth every 6 (six) hours as needed for severe pain (pain score 7-10). 05/28/23   [provider]  hydrOXYzine (ATARAX) 50 MG tablet Take 50 mg by mouth at bedtime. 07/04/15   [provider]  ibuprofen  (ADVIL ) 800 MG tablet Take 800 mg by mouth every 8 (eight) hours as needed for  moderate pain (pain score 4-6).    [provider]  levocetirizine (XYZAL) 5 MG tablet Take 5 mg by mouth every evening.    [provider]  lisinopril (ZESTRIL) 5 MG tablet Take 5 mg by mouth at bedtime.    [provider]  LORazepam (ATIVAN) 0.5 MG tablet Take 0.5 mg by mouth 2 (two) times daily as needed for anxiety.    [provider]  magnesium oxide (MAG-OX) 400 (240 Mg) MG tablet Take 400 mg by mouth at bedtime.    [provider]  Melatonin 10 MG CHEW Chew 10 mg by mouth at bedtime.    [provider]  MICROLET LANCETS MISC Use to check blood sugar. 07/02/16   Trixie File, MD  Multiple Vitamin (MULTIVITAMIN WITH MINERALS) TABS tablet Take 1 tablet by mouth daily. Multivitamin for Adults 50+ (Iron Free)  gummy    [provider]  omeprazole (PRILOSEC) 40 MG capsule Take 40 mg by mouth 2 (two) times daily. 05/09/20   [provider]  Semaglutide 14 MG TABS Take 14 mg  by mouth daily. 04/29/20   [provider]  tiZANidine (ZANAFLEX) 4 MG tablet Take 4 mg by mouth at bedtime. 04/29/20   [provider]  liraglutide (VICTOZA) 18 MG/3ML SOPN Inject 1.2 mg into the skin every evening.  11/02/19  [provider]    Family History Family History  Problem Relation Age of Onset   Diabetes Mother    Hypertension Mother    Hyperlipidemia Mother    Cancer Father    Thyroid disease Sister     Social History Social History   Tobacco Use   Smoking status: Never   Smokeless tobacco: Never  Vaping Use   Vaping status: Never Used  Substance Use Topics   Alcohol use: Yes    Comment: rarely   Drug use: Not Currently    Types: Marijuana    Comment: rare marijuana use only last used 3 months ago     Allergies   Lactose intolerance (gi), Trazodone hcl, and Trulicity [dulaglutide]   Review of Systems Review of Systems PER HPI  Physical Exam Triage Vital Signs ED Triage Vitals   Encounter Vitals Group     BP 01/27/24 1419 121/79     Girls Systolic BP Percentile --      Girls Diastolic BP Percentile --      Boys Systolic BP Percentile --      Boys Diastolic BP Percentile --      Pulse Rate 01/27/24 1419 82     Resp 01/27/24 1419 20     Temp 01/27/24 1419 98.4 F (36.9 C)     Temp Source 01/27/24 1419 Oral     SpO2 01/27/24 1419 97 %     Weight --      Height --      Head Circumference --      Peak Flow --      Pain Score 01/27/24 1417 5     Pain Loc --      Pain Education --      Exclude from Growth Chart --    No data found.  Updated Vital Signs BP 121/79 (BP Location: Right Arm)   Pulse 82   Temp 98.4 F (36.9 C) (Oral)   Resp 20   SpO2 97%   Visual Acuity Right Eye Distance:   Left Eye Distance:   Bilateral Distance:    Right Eye Near:   Left Eye Near:    Bilateral Near:     Physical Exam Vitals and nursing note reviewed.  Constitutional:      Appearance: He is well-developed.  HENT:     Head: Atraumatic.     Right Ear: External ear normal.     Left Ear: External ear normal.     Nose: Rhinorrhea present.     Mouth/Throat:     Pharynx: Posterior oropharyngeal erythema present. No oropharyngeal exudate.     Comments: Cobblestoning, post-nasal drainage  Eyes:     Conjunctiva/sclera: Conjunctivae normal.     Pupils: Pupils are equal, round, and reactive to light.    Cardiovascular:     Rate and Rhythm: Normal rate and regular rhythm.     Heart sounds: Normal heart sounds.  Pulmonary:     Effort: Pulmonary effort is normal. No respiratory distress.     Breath sounds: No wheezing or rales.   Musculoskeletal:        General: Normal range of motion.     Cervical back: Normal range of motion and neck supple.  Lymphadenopathy:     Cervical: No cervical adenopathy.   Skin:    General: Skin is warm and dry.   Neurological:     Mental Status: He is alert and oriented to person, place, and time.   Psychiatric:         Behavior: Behavior normal.      UC Treatments / Results  Labs (all labs ordered are listed, but only abnormal results are displayed) Labs Reviewed  POCT RAPID STREP A (OFFICE)    EKG   Radiology No results found.  Procedures Procedures (including critical care time)  Medications Ordered in UC Medications - No data to display  Initial Impression / Assessment and Plan / UC Course  I have reviewed the triage vital signs and the nursing notes.  Pertinent labs & imaging results that were available during my care of the patient were reviewed by me and considered in my medical decision making (see chart for details).     Rapid strep negative, vitals and exam very reassuring today.  No evidence of a bacterial infection today.  Suspect viral bronchitis.  Treat with Phenergan  DM, Flonase , prednisone , supportive over-the-counter medications and home care.  Return for worsening symptoms.  Final Clinical Impressions(s) / UC Diagnoses   Final diagnoses:  Viral bronchitis     Discharge Instructions      Your vital signs and exam are very reassuring today, your rapid strep test was negative.  I suspect a viral respiratory infection.  I have prescribed a good nasal spray and cough syrup to help with your symptoms and you may take Coricidin HBP, plain Mucinex , saline sinus rinses, warm salt water gargles and warm honey tea additionally.  Follow-up for significantly worsening symptoms.    ED Prescriptions     Medication Sig Dispense Auth. Provider   promethazine -dextromethorphan (PROMETHAZINE -DM) 6.25-15 MG/5ML syrup Take 5 mLs by mouth 4 (four) times daily as needed. 100 mL Stuart Vernell Norris, PA-C   fluticasone  (FLONASE ) 50 MCG/ACT nasal spray Place 1 spray into both nostrils 2 (two) times daily. 16 g Stuart Vernell Norris, PA-C   predniSONE  (DELTASONE ) 20 MG tablet Take 2 tablets (40 mg total) by mouth daily with breakfast. 10 tablet Stuart Vernell Norris, [place]      PDMP  not reviewed this encounter.   Stuart Vernell Norris, PA-C 01/27/24 1500    Stuart Vernell Norris, [place] 01/27/24 1501

## 2024-01-27 NOTE — ED Triage Notes (Signed)
 Pt reports sore throat hoarseness, and cough, producing green colored mucus when coughing.  x 9 days

## 2024-01-27 NOTE — Discharge Instructions (Signed)
 Your vital signs and exam are very reassuring today, your rapid strep test was negative.  I suspect a viral respiratory infection.  I have prescribed a good nasal spray and cough syrup to help with your symptoms and you may take Coricidin HBP, plain Mucinex , saline sinus rinses, warm salt water gargles and warm honey tea additionally.  Follow-up for significantly worsening symptoms.

## 2024-01-29 DIAGNOSIS — J019 Acute sinusitis, unspecified: Secondary | ICD-10-CM | POA: Diagnosis not present

## 2024-02-27 ENCOUNTER — Ambulatory Visit
Attending: Student in an Organized Health Care Education/Training Program | Admitting: Student in an Organized Health Care Education/Training Program

## 2024-02-27 ENCOUNTER — Encounter: Payer: Self-pay | Admitting: Student in an Organized Health Care Education/Training Program

## 2024-02-27 VITALS — BP 126/86 | HR 78 | Temp 97.2°F | Resp 18 | Ht 74.0 in | Wt 269.0 lb

## 2024-02-27 DIAGNOSIS — G894 Chronic pain syndrome: Secondary | ICD-10-CM | POA: Insufficient documentation

## 2024-02-27 DIAGNOSIS — M47812 Spondylosis without myelopathy or radiculopathy, cervical region: Secondary | ICD-10-CM | POA: Insufficient documentation

## 2024-02-27 DIAGNOSIS — M47894 Other spondylosis, thoracic region: Secondary | ICD-10-CM | POA: Insufficient documentation

## 2024-02-27 NOTE — Progress Notes (Signed)
 PROVIDER NOTE: Interpretation of information contained herein should be left to medically-trained personnel. Specific patient instructions are provided elsewhere under Patient Instructions section of medical record. This document was created in part using AI and STT-dictation technology, any transcriptional errors that may result from this process are unintentional.  Patient: Scott [name]  Service: E/M   PCP: Arloa Elsie SAUNDERS, MD  DOB: [birth year]  DOS: 02/27/2024  Provider: Wallie Sherry, MD  MRN: [id number]  Delivery: Face-to-face  Specialty: Interventional Pain Management  Type: Established Patient  Setting: Ambulatory outpatient facility  Specialty designation: 09  Referring Prov.: Arloa Elsie SAUNDERS, MD  Location: Outpatient office facility       History of present illness (HPI) [name], a 58 y.o. year old male, is here today because of his Cervical spondylosis [M47.812]. [name] primary complain today is Back Pain  Pain Assessment: Severity of Chronic pain is reported as a 0-No pain/10. Location: Back Upper/ . Onset: More than a month ago. Quality: Aching, Discomfort. Timing: Constant. Modifying factor(s):  SABRA Vitals:  height is 6' 2 (1.88 m) and weight is 269 lb (122 kg). His temperature is 97.2 F (36.2 C) (abnormal). His blood pressure is 126/86 and his pulse is 78. His respiration is 18 and oxygen saturation is 99%.  BMI: Estimated body mass index is 34.54 kg/m as calculated from the following:   Height as of this encounter: 6' 2 (1.88 m).   Weight as of this encounter: 269 lb (122 kg).  Last encounter: 12/26/2023. Last procedure: 01/22/2024.  Reason for encounter:   Post-Procedure Evaluation   Procedure: Cervical and Thoracic facet medial branch block #2  Laterality: Bilateral (-50)  Level: C7, T1, T2, and T3 Medial Branch Level(s)  Imaging: Fluoroscopy-guided Spinal ([id number]) Anesthesia: Local anesthesia (1-2% Lidocaine ) Sedation: Minimal  Sedation                       DOS: 01/22/2024  Performed by: Wallie Sherry, MD  Purpose: Diagnostic/Therapeutic Indications: Thoracic back pain severe enough to impact quality of life or function. Rationale (medical necessity): procedure needed and proper for the diagnosis and/or treatment of [name] medical symptoms and needs. 1. Cervical spondylosis   2. Cervical facet joint syndrome   3. Chronic pain syndrome   4. Thoracic facet joint syndrome    NAS-11 Pain score:   Pre-procedure: 5 /10   Post-procedure: 0-No pain/10     Effectiveness:  Initial hour after procedure: 100 %  Subsequent 4-6 hours post-procedure: 100 %  Analgesia past initial 6 hours: 95 % (ongoing)  Ongoing improvement:  Analgesic:  95% Function: Somewhat improved ROM: Somewhat improved   ROS  Constitutional: Denies any fever or chills Gastrointestinal: No reported hemesis, hematochezia, vomiting, or acute GI distress Musculoskeletal: Denies any acute onset joint swelling, redness, loss of ROM, or weakness Neurological: No reported episodes of acute onset apraxia, aphasia, dysarthria, agnosia, amnesia, paralysis, loss of coordination, or loss of consciousness  Medication Review  Dapagliflozin Pro-metFORMIN  ER, Empagliflozin-metFORMIN  HCl ER, HYDROcodone -acetaminophen , Iron, LORazepam, Melatonin, Microlet Lancets, Semaglutide, atenolol-chlorthalidone, atorvastatin, busPIRone, colestipol , dorzolamide-timolol, fluticasone , gabapentin , glucose blood, hydrOXYzine, ibuprofen , levocetirizine, liraglutide, lisinopril, magnesium oxide, multivitamin with minerals, omeprazole, predniSONE , promethazine -dextromethorphan, and tiZANidine  History Review  Allergy: [name] is allergic to lactose intolerance (gi), trazodone hcl, and trulicity [dulaglutide]. Drug: [name]  reports that he does not currently use drugs after having used the following drugs: Marijuana. Alcohol:  reports current alcohol  use. Tobacco:  reports that he has never  smoked. He has never used smokeless tobacco. Social: [name]  reports that he has never smoked. He has never used smokeless tobacco. He reports current alcohol use. He reports that he does not currently use drugs after having used the following drugs: Marijuana. Medical:  has a past medical history of Anemia, Anxiety, Arthritis, Back pain, Diabetes mellitus without complication (HCC), Elevated cholesterol with elevated triglycerides, Elevated liver enzymes (2014), GERD (gastroesophageal reflux disease), Hypertension, Hypertension, and Sleep apnea. Surgical: [name]  has a past surgical history that includes Esophagogastroduodenoscopy (N/A, 09/29/2012); Colonoscopy (N/A, 09/29/2012); Fracture surgery; Hernia repair (2008); Esophagogastroduodenoscopy (egd) with propofol  (N/A, 06/22/2016); Colonoscopy with propofol  (N/A, 06/22/2016); Colonoscopy with propofol  (N/A, 05/20/2020); enteroscopy (N/A, 05/20/2020); Cataract extraction (Left); Knee arthroscopy (Left, 06/24/2020); Colonoscopy with propofol  (N/A, 03/12/2023); and biopsy (03/12/2023). Family: family history includes Cancer in his father; Diabetes in his mother; Hyperlipidemia in his mother; Hypertension in his mother; Thyroid disease in his sister.  Laboratory Chemistry Profile   Renal Lab Results  Component Value Date   BUN 16 02/18/2023   CREATININE 0.71 02/18/2023   GFRNONAA >60 02/18/2023    Hepatic No results found for: AST, ALT, ALBUMIN, ALKPHOS, HCVAB, AMYLASE, LIPASE, AMMONIA  Electrolytes Lab Results  Component Value Date   NA 135 02/18/2023   K 3.0 (L) 02/18/2023   CL 99 02/18/2023   CALCIUM 8.9 02/18/2023    Bone No results found for: VD25OH, CI874NY7UNU, CI6874NY7, CI7874NY7, 25OHVITD1, 25OHVITD2, 25OHVITD3, TESTOFREE, TESTOSTERONE  Inflammation (CRP: Acute Phase) (ESR: Chronic Phase) No results found for: CRP, ESRSEDRATE, LATICACIDVEN        Note: Above Lab results reviewed.  Recent Imaging Review  DG PAIN CLINIC C-ARM 1-60 MIN NO REPORT Fluoro was used, but no Radiologist interpretation will be provided.  Please refer to NOTES tab for provider progress note. Note: Reviewed        Physical Exam  Vitals: BP 126/86   Pulse 78   Temp (!) 97.2 F (36.2 C)   Resp 18   Ht 6' 2 (1.88 m)   Wt 269 lb (122 kg)   SpO2 99%   BMI 34.54 kg/m  BMI: Estimated body mass index is 34.54 kg/m as calculated from the following:   Height as of this encounter: 6' 2 (1.88 m).   Weight as of this encounter: 269 lb (122 kg). Ideal: Ideal body weight: 82.2 kg (181 lb 3.5 oz) Adjusted ideal body weight: 98.1 kg (216 lb 5.3 oz) General appearance: Well nourished, well developed, and well hydrated. In no apparent acute distress Mental status: Alert, oriented x 3 (person, place, & time)       Respiratory: No evidence of acute respiratory distress Eyes: PERLA Improved cervical range of motion  Assessment   Diagnosis Status  1. Cervical spondylosis   2. Cervical facet joint syndrome   3. Thoracic facet joint syndrome   4. Chronic pain syndrome    Controlled Controlled Controlled   Updated Problems: No problems updated.  Plan of Care  Patient is status post his second positive diagnostic cervical/thoracic facet medial branch block #2.  He notes significant pain relief rated at 95% that is ongoing.  He recently returned from an international trip with his family which he states was excellent and he states that his pain was well-managed and under control so that he can enjoy activities during his travel.  We will continue to monitor his symptoms.  Should he have return of his pain, he could consider cervical/thoracic radiofrequency ablation.  Patient endorses  understanding will follow-up as needed Orders:  No orders of the defined types were placed in this encounter.    B/L C7, T1-2-3 MBNB 12/11/23, 01/22/24    Return for  patient will call to schedule F2F appt prn.    Recent Visits Date Type Provider Dept  01/22/24 Procedure visit Marcelino Nurse, MD [facility]  12/26/23 Office Visit Marcelino Nurse, MD [facility]  12/11/23 Procedure visit Marcelino Nurse, MD [facility]  Showing recent visits within past 90 days and meeting all other requirements Today's Visits Date Type Provider Dept  02/27/24 Office Visit Marcelino Nurse, MD [facility]  Showing today's visits and meeting all other requirements Future Appointments No visits were found meeting these conditions. Showing future appointments within next 90 days and meeting all other requirements  I discussed the assessment and treatment plan with the patient. The patient was provided an opportunity to ask questions and all were answered. The patient agreed with the plan and demonstrated an understanding of the instructions.  Patient advised to call back or seek an in-person evaluation if the symptoms or condition worsens.  Duration of encounter: 15 minutes.  Total time on encounter, as per AMA guidelines included both the face-to-face and non-face-to-face time personally spent by the physician and/or other qualified health care professional(s) on the day of the encounter (includes time in activities that require the physician or other qualified health care professional and does not include time in activities normally performed by clinical staff). Physician's time may include the following activities when performed: Preparing to see the patient (e.g., pre-charting review of records, searching for previously ordered imaging, lab work, and nerve conduction tests) Review of prior analgesic pharmacotherapies. Reviewing PMP Interpreting ordered tests (e.g., lab work, imaging, nerve conduction tests) Performing post-procedure evaluations, including interpretation of diagnostic procedures Obtaining and/or reviewing separately  obtained history Performing a medically appropriate examination and/or evaluation Counseling and educating the patient/family/caregiver Ordering medications, tests, or procedures Referring and communicating with other health care professionals (when not separately reported) Documenting clinical information in the electronic or other health record Independently interpreting results (not separately reported) and communicating results to the patient/ family/caregiver Care coordination (not separately reported)  Note by: Nurse Marcelino, MD (TTS and AI technology used. I apologize for any typographical errors that were not detected and corrected.) Date: 02/27/2024; Time: 2:32 PM

## 2024-02-27 NOTE — Progress Notes (Signed)
 Safety precautions to be maintained throughout the outpatient stay will include: orient to surroundings, keep bed in low position, maintain call bell within reach at all times, provide assistance with transfer out of bed and ambulation.

## 2024-03-04 ENCOUNTER — Encounter: Payer: Self-pay | Admitting: Student in an Organized Health Care Education/Training Program

## 2024-03-23 ENCOUNTER — Other Ambulatory Visit: Payer: Self-pay | Admitting: Student in an Organized Health Care Education/Training Program

## 2024-03-23 DIAGNOSIS — M47894 Other spondylosis, thoracic region: Secondary | ICD-10-CM

## 2024-03-23 DIAGNOSIS — M47812 Spondylosis without myelopathy or radiculopathy, cervical region: Secondary | ICD-10-CM

## 2024-03-23 DIAGNOSIS — G894 Chronic pain syndrome: Secondary | ICD-10-CM

## 2024-03-23 NOTE — Progress Notes (Unsigned)
 Patient is status post his second positive diagnostic cervical/thoracic facet medial branch block #2 01/22/2024. He experienced significant pain relief rated at 95% for approximately 6 weeks now is having return of his pain.  We discussed cervical/thoracic radiofrequency ablation at bilateral C7, T1, T2, T3.  Risks and benefits reviewed and patient would like to proceed.  1. Cervical spondylosis (Primary) - Radiofrequency,Thoracic; Future  2. Cervical facet joint syndrome - Radiofrequency,Thoracic; Future  3. Thoracic facet joint syndrome - Radiofrequency,Thoracic; Future  4. Chronic pain syndrome - Radiofrequency,Thoracic; Future  Orders Placed This Encounter  Procedures   Radiofrequency,Thoracic    Standing Status:   Future    Expected Date:   04/20/2024    Expiration Date:   03/23/2025    Scheduling Instructions:     Side(s): Bilateral     Level(s): C7, T1, T2, T3     Sedation: With Sedation ECT     Scheduling Timeframe: As soon as pre-approved    Where will this procedure be performed?:   [facility]

## 2024-04-15 ENCOUNTER — Ambulatory Visit
Admission: RE | Admit: 2024-04-15 | Discharge: 2024-04-15 | Disposition: A | Discharge status: Home / Self Care | Source: Ambulatory Visit | Attending: Student in an Organized Health Care Education/Training Program | Admitting: Student in an Organized Health Care Education/Training Program

## 2024-04-15 ENCOUNTER — Encounter: Payer: Self-pay | Admitting: Student in an Organized Health Care Education/Training Program

## 2024-04-15 ENCOUNTER — Ambulatory Visit: Admitting: Student in an Organized Health Care Education/Training Program

## 2024-04-15 DIAGNOSIS — M47894 Other spondylosis, thoracic region: Secondary | ICD-10-CM | POA: Insufficient documentation

## 2024-04-15 DIAGNOSIS — M47812 Spondylosis without myelopathy or radiculopathy, cervical region: Secondary | ICD-10-CM | POA: Diagnosis not present

## 2024-04-15 DIAGNOSIS — G894 Chronic pain syndrome: Secondary | ICD-10-CM

## 2024-04-15 MED ORDER — LACTATED RINGERS IV SOLN: Freq: Once | INTRAVENOUS | Status: AC

## 2024-04-15 MED ORDER — MIDAZOLAM HCL 5 MG/5ML IJ SOLN
0.5000 mg | Freq: Once | INTRAMUSCULAR | Status: AC
  Administered 2024-04-15: 5 mg via INTRAVENOUS

## 2024-04-15 MED ORDER — ROPIVACAINE HCL 2 MG/ML IJ SOLN
18.0000 mL | Freq: Once | INTRAMUSCULAR | Status: AC
  Administered 2024-04-15: 18 mL via PERINEURAL

## 2024-04-15 MED ORDER — LIDOCAINE HCL 2 % IJ SOLN
20.0000 mL | Freq: Once | INTRAMUSCULAR | Status: AC
  Administered 2024-04-15: 400 mg

## 2024-04-15 MED ORDER — FENTANYL CITRATE (PF) 100 MCG/2ML IJ SOLN
25.0000 ug | INTRAMUSCULAR | Status: DC | PRN
  Administered 2024-04-15: 100 ug via INTRAVENOUS

## 2024-04-15 MED ORDER — DEXAMETHASONE SODIUM PHOSPHATE 10 MG/ML IJ SOLN
INTRAMUSCULAR | Status: AC
  Filled 2024-04-15: qty 2

## 2024-04-15 MED ORDER — FENTANYL CITRATE (PF) 100 MCG/2ML IJ SOLN
INTRAMUSCULAR | Status: AC
  Filled 2024-04-15: qty 2

## 2024-04-15 MED ORDER — LIDOCAINE HCL 2 % IJ SOLN
INTRAMUSCULAR | Status: AC
  Filled 2024-04-15: qty 20

## 2024-04-15 MED ORDER — MIDAZOLAM HCL 5 MG/5ML IJ SOLN
INTRAMUSCULAR | Status: AC
  Filled 2024-04-15: qty 5

## 2024-04-15 MED ORDER — ROPIVACAINE HCL 2 MG/ML IJ SOLN
INTRAMUSCULAR | Status: AC
  Filled 2024-04-15: qty 20

## 2024-04-15 MED ORDER — DEXAMETHASONE SODIUM PHOSPHATE 10 MG/ML IJ SOLN
20.0000 mg | Freq: Once | INTRAMUSCULAR | Status: AC
  Administered 2024-04-15: 20 mg

## 2024-04-15 NOTE — Patient Instructions (Signed)

## 2024-04-15 NOTE — Progress Notes (Signed)
 Safety precautions to be maintained throughout the outpatient stay will include: orient to surroundings, keep bed in low position, maintain call bell within reach at all times, provide assistance with transfer out of bed and ambulation.

## 2024-04-15 NOTE — Progress Notes (Signed)
 PROVIDER NOTE: Interpretation of information contained herein should be left to medically-trained personnel. Specific patient instructions are provided elsewhere under Patient Instructions section of medical record. This document was created in part using STT-dictation technology, any transcriptional errors that may result from this process are unintentional.  Patient: Scott Scott Type: Established DOB: 04-Sep-1965 MRN: [id number] PCP: Arloa Elsie SAUNDERS, MD  Service: Procedure DOS: 04/15/2024 Setting: Ambulatory Location: Ambulatory outpatient facility Delivery: Face-to-face Provider: Wallie Sherry, MD Specialty: Interventional Pain Management Specialty designation: 09 Location: Outpatient facility Ref. Prov.: Sherry Wallie, MD       Interventional Therapy    Procedure #1: CPT Reference: 64461(single); 64462(additional levels) Type: Thoracic facet medial branch Radiofrequency Ablation (RFA) #1  Laterality: Bilateral  Level: C7, T1, T2, and T3 Medial Branch Level(s)  Imaging: Fluoroscopy-guided Spinal ([id number]) Anesthesia: Local anesthesia (1-2% Lidocaine ) Sedation: Moderate Sedation                       DOS: 04/15/2024  Performed by: Wallie Sherry, MD  Purpose: Diagnostic/Therapeutic Indications: Thoracic back pain severe enough to impact quality of life or function. Rationale (medical necessity): procedure needed and proper for the diagnosis and/or treatment of Scott Scott medical symptoms and needs. 1. Cervical spondylosis   2. Cervical facet joint syndrome   3. Thoracic facet joint syndrome   4. Chronic pain syndrome    NAS-11 Pain score:   Pre-procedure: 5 /10   Post-procedure: 0-No pain/10     Target: Thoracic posterior (dorsal) primary rami nerve Location: 2.5 cm lateral to midline (spinous process), just cephalad to upper transverse process edge.  (needle tip placement) Region: Thoracic  Approach: Paravertebral  Type of procedure: Percutaneous perineural  nerve block   Pertinent Anatomy: There are two potential fascial compartments in the TPVS: the anterior extrapleural paravertebral compartment and the posterior subendothoracic paravertebral compartment. The transverse process and the superior costotransverse ligament form the posterior boundary.  Position / Prep / Materials: Position: Prone  Prep solution: ChloraPrep (2% chlorhexidine  gluconate and 70% isopropyl alcohol) Prep Area: Entire upper back region Materials:  Tray: Block Needle(s):  Type: Spinal  Gauge (G): 22  Length: 3.5-in  Qty: 4  H&P (Pre-op Assessment):  Scott Scott is a 58 y.o. (year old), male patient, seen today for interventional treatment. He  has a past surgical history that includes Esophagogastroduodenoscopy (N/A, 09/29/2012); Colonoscopy (N/A, 09/29/2012); Fracture surgery; Hernia repair (2008); Esophagogastroduodenoscopy (egd) with propofol  (N/A, 06/22/2016); Colonoscopy with propofol  (N/A, 06/22/2016); Colonoscopy with propofol  (N/A, 05/20/2020); enteroscopy (N/A, 05/20/2020); Cataract extraction (Left); Knee arthroscopy (Left, 06/24/2020); Colonoscopy with propofol  (N/A, 03/12/2023); and biopsy (03/12/2023). Scott Scott has a current medication list which includes the following prescription(s): atenolol-chlorthalidone, atorvastatin, buspirone, colestipol , dapagliflozin pro-metformin  er, dorzolamide-timolol, iron, fluticasone , fluticasone , gabapentin , glucose blood, hydrocodone -acetaminophen , hydroxyzine, ibuprofen , levocetirizine, lisinopril, lorazepam, magnesium oxide, melatonin, microlet lancets, multivitamin with minerals, omeprazole, prednisone , promethazine -dextromethorphan, semaglutide, synjardy xr, tizanidine, and [DISCONTINUED] liraglutide, and the following Facility-Administered Medications: fentanyl . His primarily concern today is the Back Pain (Back, mid)  Initial Vital Signs:  Pulse/HCG Rate: 64ECG Heart Rate: 73 (nsr) Temp: 97.9 F (36.6 C) Resp: 18 BP:  113/76 SpO2: 100 %  BMI: Estimated body mass index is 34.92 kg/m as calculated from the following:   Height as of this encounter: 6' 2 (1.88 m).   Weight as of this encounter: 272 lb (123.4 kg).  Risk Assessment: Allergies: Reviewed. He is allergic to lactose intolerance (gi), trazodone hcl, and trulicity [dulaglutide].  Allergy Precautions: None required Coagulopathies: Reviewed. None identified.  Blood-thinner therapy: None at this time Active Infection(s): Reviewed. None identified. Scott Scott is afebrile  Site Confirmation: Scott Scott was asked to confirm the procedure and laterality before marking the site Procedure checklist: Completed Consent: Before the procedure and under the influence of no sedative(s), amnesic(s), or anxiolytics, the patient was informed of the treatment options, risks and possible complications. To fulfill our ethical and legal obligations, as recommended by the American Medical Association's Code of Ethics, I have informed the patient of my clinical impression; the nature and purpose of the treatment or procedure; the risks, benefits, and possible complications of the intervention; the alternatives, including doing nothing; the risk(s) and benefit(s) of the alternative treatment(s) or procedure(s); and the risk(s) and benefit(s) of doing nothing. The patient was provided information about the general risks and possible complications associated with the procedure. These may include, but are not limited to: failure to achieve desired goals, infection, bleeding, organ or nerve damage, allergic reactions, paralysis, and death. In addition, the patient was informed of those risks and complications associated to Spine-related procedures, such as failure to decrease pain; infection (i.e.: Meningitis, epidural or intraspinal abscess); bleeding (i.e.: epidural hematoma, subarachnoid hemorrhage, or any other type of intraspinal or peri-dural bleeding); organ or nerve  damage (i.e.: Any type of peripheral nerve, nerve root, or spinal cord injury) with subsequent damage to sensory, motor, and/or autonomic systems, resulting in permanent pain, numbness, and/or weakness of one or several areas of the body; allergic reactions; (i.e.: anaphylactic reaction); and/or death. Furthermore, the patient was informed of those risks and complications associated with the medications. These include, but are not limited to: allergic reactions (i.e.: anaphylactic or anaphylactoid reaction(s)); adrenal axis suppression; blood sugar elevation that in diabetics may result in ketoacidosis or comma; water retention that in patients with history of congestive heart failure may result in shortness of breath, pulmonary edema, and decompensation with resultant heart failure; weight gain; swelling or edema; medication-induced neural toxicity; particulate matter embolism and blood vessel occlusion with resultant organ, and/or nervous system infarction; and/or aseptic necrosis of one or more joints. Finally, the patient was informed that Medicine is not an exact science; therefore, there is also the possibility of unforeseen or unpredictable risks and/or possible complications that may result in a catastrophic outcome. The patient indicated having understood very clearly. We have given the patient no guarantees and we have made no promises. Enough time was given to the patient to ask questions, all of which were answered to the patient's satisfaction. Scott Scott has indicated that he wanted to continue with the procedure. Attestation: I, the ordering provider, attest that I have discussed with the patient the benefits, risks, side-effects, alternatives, likelihood of achieving goals, and potential problems during recovery for the procedure that I have provided informed consent. Date  Time: 04/15/2024 10:00 AM  Pre-Procedure Preparation:  Monitoring: As per clinic protocol. Respiration, ETCO2, SpO2,  BP, heart rate and rhythm monitor placed and checked for adequate function Safety Precautions: Patient was assessed for positional comfort and pressure points before starting the procedure. Time-out: I initiated and conducted the Time-out before starting the procedure, as per protocol. The patient was asked to participate by confirming the accuracy of the Time Out information. Verification of the correct person, site, and procedure were performed and confirmed by me, the nursing staff, and the patient. Time-out conducted as per Joint Commission's Universal Protocol (UP.01.01.01). Time: 1043 Start Time: 1043 hrs.  Description of Procedure:         Target  Area: For Thoracic Facet Medial Branch, the target is between the top of the transverse processes at the point where the transverse process joints the vertebra and the superior-lateral aspect of the transverse process. (More lateral towards the superior aspect of the thoracic spine.) Area Prepped: Entire Upper Back Area ChloraPrep (2% chlorhexidine  gluconate and 70% isopropyl alcohol) Safety Precautions: Aspiration looking for blood return was conducted prior to all injections. At no point did we inject any substances, as a needle was being advanced. No attempts were made at seeking any paresthesias. Safe injection practices and needle disposal techniques used. Medications properly checked for expiration dates. SDV (single dose vial) medications used. Description of the Procedure: Protocol guidelines were followed. The patient was placed in position over the procedure table. The target area was identified and the area prepped in the usual manner. The skin and muscle were infiltrated with local anesthetic. Appropriate amount of time allowed to pass for local anesthetics to take effect. Radiofrequency needles were introduced to the target area using fluoroscopic guidance. Using the [employer] Radiofrequency Generator, sensory stimulation using 50 Hz was  used to locate & identify the nerve, making sure that the needle was positioned such that there was no sensory stimulation below 0.3 V or above 0.7 V. Stimulation using 2 Hz was used to evaluate the motor component. Care was taken not to lesion any nerves that demonstrated motor stimulation of the lower extremities at an output of less than 2.5 times that of the sensory threshold, or a maximum of 2.0 V. Once satisfactory placement of the needles was achieved, the numbing solution was slowly injected after negative aspiration. After waiting for at least 2 minutes, the ablation was performed at 80 degrees C for 60 seconds, using regular Radiofrequency settings. Once the procedure was completed, the needles were then removed and the area cleansed, making sure to leave some of the prepping solution back to take advantage of its long term bactericidal properties. Intra-operative Compliance: Compliant            Vitals:   04/15/24 1120 04/15/24 1130 04/15/24 1140 04/15/24 1150  BP: (!) 127/93 102/70 104/62 104/64  Pulse:      Resp: 17 (!) 21 11 14   Temp:  98.5 F (36.9 C)  98.6 F (37 C)  SpO2: 100% 95% 97% 98%  Weight:      Height:        Start Time: 1043 hrs. End Time:   hrs. Materials & Medications:  Needle(s) Type: Teflon-coated, curved tip, Radiofrequency needle(s) Gauge: 22G Length: 10cm Medication(s): Please see orders for medications and dosing details. 8 cc solution made of 6 cc of 0.2% ropivacaine , 2 cc of Decadron , 10 mg/cc cc. 2 cc injected at each level above after sensorimotor testing, prior to lesioning  Imaging Guidance (Spinal):         Type of Imaging Technique: Fluoroscopy Guidance (Spinal) Indication(s): Fluoroscopy guidance for needle placement to enhance accuracy in procedures requiring precise needle localization for targeted delivery of medication in or near specific anatomical locations not easily accessible without such real-time imaging assistance. Exposure Time:  Please see nurses notes. Contrast: None used. Fluoroscopic Guidance: I was personally present during the use of fluoroscopy. Tunnel Vision Technique used to obtain the best possible view of the target area. Parallax error corrected before commencing the procedure. Direction-depth-direction technique used to introduce the needle under continuous pulsed fluoroscopy. Once target was reached, antero-posterior, oblique, and lateral fluoroscopic projection used confirm needle placement in all planes. Images  permanently stored in EMR. Interpretation: No contrast injected. I personally interpreted the imaging intraoperatively. Adequate needle placement confirmed in multiple planes. Permanent images saved into the patient's record.  Antibiotic Prophylaxis:   Anti-infectives (From admission, onward)    None      Indication(s): None identified  Post-operative Assessment:  Post-procedure Vital Signs:  Pulse/HCG Rate: 6470 Temp: 98.6 F (37 C) Resp: 14 BP: 104/64 SpO2: 98 %  EBL: None  Complications: No immediate post-treatment complications observed by team, or reported by patient.  Note: The patient tolerated the entire procedure well. A repeat set of vitals were taken after the procedure and the patient was kept under observation following institutional policy, for this type of procedure. Post-procedural neurological assessment was performed, showing return to baseline, prior to discharge. The patient was provided with post-procedure discharge instructions, including a section on how to identify potential problems. Should any problems arise concerning this procedure, the patient was given instructions to immediately contact us , at any time, without hesitation. In any case, we plan to contact the patient by telephone for a follow-up status report regarding this interventional procedure.  Comments:  No additional relevant information.  Plan of Care (POC)  Orders:  Orders Placed This  Encounter  Procedures   DG PAIN CLINIC C-ARM 1-60 MIN NO REPORT    Intraoperative interpretation by procedural physician at [facility].    Standing Status:   Standing    Number of Occurrences:   1    Reason for exam::   Assistance in needle guidance and placement for procedures requiring needle placement in or near specific anatomical locations not easily accessible without such assistance.    Medications ordered for procedure: Meds ordered this encounter  Medications   lidocaine  (XYLOCAINE ) 2 % (with pres) injection 400 mg   lactated ringers  infusion   midazolam  (VERSED ) 5 MG/5ML injection 0.5-2 mg    Make sure Flumazenil is available in the pyxis when using this medication. If oversedation occurs, administer 0.2 mg IV over 15 sec. If after 45 sec no response, administer 0.2 mg again over 1 min; may repeat at 1 min intervals; not to exceed 4 doses (1 mg)   fentaNYL  (SUBLIMAZE ) injection 25-50 mcg    Make sure Narcan is available in the pyxis when using this medication. In the event of respiratory depression (RR< 8/min): Titrate NARCAN (naloxone) in increments of 0.1 to 0.2 mg IV at 2-3 minute intervals, until desired degree of reversal.   ropivacaine  (PF) 2 mg/mL (0.2%) (NAROPIN ) injection 18 mL   dexamethasone  (DECADRON ) injection 20 mg   Medications administered: We administered lidocaine , lactated ringers , midazolam , fentaNYL , ropivacaine  (PF) 2 mg/mL (0.2%), and dexamethasone .  See the medical record for exact dosing, route, and time of administration.    B/L C7, T1-2-3 MBNB 12/11/23, 01/22/24, B/L C7, T1, T2, T3 RFA 04/15/24      Follow-up plan:   No follow-ups on file.     Recent Visits Date Type Provider Dept  02/27/24 Office Visit Marcelino Nurse, MD [facility]  01/22/24 Procedure visit Marcelino Nurse, MD [facility]  Showing recent visits within past 90 days and meeting all other requirements Today's Visits Date Type Provider Dept   04/15/24 Procedure visit Marcelino Nurse, MD [facility]  Showing today's visits and meeting all other requirements Future Appointments Date Type Provider Dept  06/02/24 Appointment Marcelino Nurse, MD [facility]  Showing future appointments within next 90 days and meeting all other requirements  Disposition: Discharge home  Discharge (Date  Time): 04/15/2024; 1204 hrs.   Primary Care Physician: Arloa Elsie SAUNDERS, MD Location: [facility] Note by: Wallie Sherry, MD (TTS technology used. I apologize for any typographical errors that were not detected and corrected.) Date: 04/15/2024; Time: 12:14 PM  Disclaimer:  Medicine is not an Visual merchandiser. The only guarantee in medicine is that nothing is guaranteed. It is important to note that the decision to proceed with this intervention was based on the information collected from the patient. The Data and conclusions were drawn from the patient's questionnaire, the interview, and the physical examination. Because the information was provided in large part by the patient, it cannot be guaranteed that it has not been purposely or unconsciously manipulated. Every effort has been made to obtain as much relevant data as possible for this evaluation. It is important to note that the conclusions that lead to this procedure are derived in large part from the available data. Always take into account that the treatment will also be dependent on availability of resources and existing treatment guidelines, considered by other Pain Management Practitioners as being common knowledge and practice, at the time of the intervention. For Medico-Legal purposes, it is also important to point out that variation in procedural techniques and pharmacological choices are the acceptable norm. The indications, contraindications, technique, and results of the above procedure should only be interpreted and judged by a Board-Certified  Interventional Pain Specialist with extensive familiarity and expertise in the same exact procedure and technique.

## 2024-04-16 ENCOUNTER — Telehealth: Payer: Self-pay | Admitting: *Deleted

## 2024-04-16 NOTE — Telephone Encounter (Signed)
 Post procedure call; doing okay other than being sore.  Spoke with patient's wife and she reports that patient's BP is up and that is causing him some anxiety.  Reading as reported was 140's/90's, slight headache.  I told her that if he is having trouble with BP he should reach out to PCP, otherwise just continue to monitor.  Rational of steroids discussed.  Patient will continue to monitor and reach back out if s/s worsen.

## 2024-04-17 ENCOUNTER — Telehealth: Payer: Self-pay | Admitting: Student in an Organized Health Care Education/Training Program

## 2024-04-17 NOTE — Telephone Encounter (Signed)
 Wife lvmail stating patient has a lot of swelling and pain at site of ablation. Please call and advise

## 2024-04-17 NOTE — Telephone Encounter (Signed)
 Spoke with wife and she states that he is having pain and a quarter size are that is swollen.  Denies fever, or redness.  States his pain was gone the first day but it has came back,  Explained that it was normal to have extra pain after the RFA but to call or go to the ED  for any worsening of symptoms of any other concerns. Wife states understanding.

## 2024-04-20 DIAGNOSIS — R42 Dizziness and giddiness: Secondary | ICD-10-CM | POA: Diagnosis not present

## 2024-04-20 DIAGNOSIS — R350 Frequency of micturition: Secondary | ICD-10-CM | POA: Diagnosis not present

## 2024-04-22 ENCOUNTER — Ambulatory Visit
Admission: RE | Admit: 2024-04-22 | Discharge: 2024-04-22 | Disposition: A | Discharge status: Home / Self Care | Source: Ambulatory Visit | Attending: Nurse Practitioner | Admitting: Nurse Practitioner

## 2024-04-22 VITALS — BP 111/75 | HR 88 | Temp 98.1°F | Resp 16

## 2024-04-22 DIAGNOSIS — R531 Weakness: Secondary | ICD-10-CM

## 2024-04-22 DIAGNOSIS — R112 Nausea with vomiting, unspecified: Secondary | ICD-10-CM | POA: Diagnosis not present

## 2024-04-22 DIAGNOSIS — R5383 Other fatigue: Secondary | ICD-10-CM | POA: Diagnosis not present

## 2024-04-22 DIAGNOSIS — G479 Sleep disorder, unspecified: Secondary | ICD-10-CM | POA: Diagnosis not present

## 2024-04-22 DIAGNOSIS — T887XXA Unspecified adverse effect of drug or medicament, initial encounter: Secondary | ICD-10-CM | POA: Diagnosis not present

## 2024-04-22 MED ORDER — PROMETHAZINE HCL 12.5 MG PO TABS
12.5000 mg | ORAL_TABLET | Freq: Three times a day (TID) | ORAL | 0 refills | Status: DC | PRN
Start: 2024-04-22 — End: 2024-06-10

## 2024-04-22 NOTE — Discharge Instructions (Addendum)
 Take medication as prescribed. Increase fluids. You may take over-the-counter Tylenol  as needed for pain, fever, or general discomfort. Recommend a BRAT diet while nausea and vomiting persist.  This includes bananas, rice, applesauce, and toast. Follow-up with pain management as scheduled. If symptoms fail to improve, recommend scheduling an appointment with pain management to discuss the side effects of the medication provided during the procedure. Follow-up as needed.

## 2024-04-22 NOTE — ED Provider Notes (Signed)
 [facility]    CSN: [id number] Arrival date & time: 04/22/24  1507      History   Chief Complaint Chief Complaint  Patient presents with   Emesis    Vomiting, nausea, weakness, headaches, fatigue, uneasy feeling - Entered by patient    HPI Scott [name] is a 58 y.o. male.   The history is provided by the patient.   Patient presents for complaints of nausea, vomiting, weakness, headaches, fatigue, and difficulty sleeping.  Patient states that he had a procedure on 04/15/2024 which included approximately 8 steroid injections.  He states since that time, he has developed side effects and what he believes to be the steroids.  He denies fever, chills, chest pain, abdominal pain, diarrhea, or rash.  Patient states that he has seen his PCP office, but when nothing was done as he did not see his regular doctor.  Patient states his labs episode of vomiting was earlier today.  He is scheduled to follow-up with the pain management clinic in the upcoming weeks.  Past Medical History:  Diagnosis Date   Anemia    Anxiety    Arthritis    Back pain    pinched nerve sciatica   Diabetes mellitus without complication (HCC)    type  2   Elevated cholesterol with elevated triglycerides    Elevated liver enzymes 2014   normal now   GERD (gastroesophageal reflux disease)    Hypertension    Hypertension    Sleep apnea    uses cpap,setting of 14    Patient Active Problem List   Diagnosis Date Noted   Gynecomastia, male 11/21/2023   Severe back pain 11/21/2023   Cervical facet joint syndrome 11/21/2023   Chronic pain syndrome 11/21/2023   Lactose intolerance 10/28/2023   Abnormal liver function tests 09/02/2023   Change in bowel habit 09/02/2023   Diverticular disease of colon 09/02/2023   Gastro-esophageal reflux disease without esophagitis 09/02/2023   Iron deficiency anemia 09/02/2023   Morbid obesity (HCC) 09/02/2023   Right wrist pain 08/28/2023   Cervical disc  disorder at C6-C7 level with radiculopathy 08/12/2023   Spinal stenosis in cervical region 08/12/2023   Left arm weakness 08/12/2023   Severe sleep apnea 05/02/2023   Insomnia 05/02/2023   Hemorrhoids 04/22/2023   IBS (irritable bowel syndrome) 12/24/2022   Palpitations 02/21/2016    Past Surgical History:  Procedure Laterality Date   BIOPSY  03/12/2023   Procedure: BIOPSY;  Surgeon: Eartha Angelia Sieving, MD;  Location: [facility];  Service: Gastroenterology;;   CATARACT EXTRACTION Left    COLONOSCOPY N/A 09/29/2012   Procedure: COLONOSCOPY;  Surgeon: Renaye Sous, MD;  Location: [facility];  Service: Endoscopy;  Laterality: N/A;   COLONOSCOPY WITH PROPOFOL  N/A 06/22/2016   Procedure: COLONOSCOPY WITH PROPOFOL ;  Surgeon: Belvie Just, MD;  Location: [facility];  Service: Endoscopy;  Laterality: N/A;   COLONOSCOPY WITH PROPOFOL  N/A 05/20/2020   Procedure: COLONOSCOPY WITH PROPOFOL ;  Surgeon: Just Belvie, MD;  Location: [facility];  Service: Endoscopy;  Laterality: N/A;   COLONOSCOPY WITH PROPOFOL  N/A 03/12/2023   Procedure: COLONOSCOPY WITH PROPOFOL ;  Surgeon: Eartha Angelia Sieving, MD;  Location: [facility];  Service: Gastroenterology;  Laterality: N/A;  11:00am;asa 3, can be done in Room 1 if rescheduled soon 7/15 cy   ENTEROSCOPY N/A 05/20/2020   Procedure: ENTEROSCOPY;  Surgeon: Just Belvie, MD;  Location: [facility];  Service: Endoscopy;  Laterality: N/A;   ESOPHAGOGASTRODUODENOSCOPY N/A 09/29/2012   Procedure: ESOPHAGOGASTRODUODENOSCOPY (  EGD);  Surgeon: Renaye Sous, MD;  Location: [facility];  Service: Endoscopy;  Laterality: N/A;   ESOPHAGOGASTRODUODENOSCOPY (EGD) WITH PROPOFOL  N/A 06/22/2016   Procedure: ESOPHAGOGASTRODUODENOSCOPY (EGD) WITH PROPOFOL ;  Surgeon: Belvie Just, MD;  Location: [facility];  Service: Endoscopy;  Laterality: N/A;   FRACTURE SURGERY     right wrist with screws   HERNIA REPAIR  2008   inguinal   KNEE ARTHROSCOPY Left 06/24/2020    Procedure: ARTHROSCOPY KNEE WITH PARTIAL MEDIAL MENISCECTOMY, CHONDROPLASTY,  OPEN CYSTECTOMY;  Surgeon: Yvone Rush, MD;  Location: [facility];  Service: Orthopedics;  Laterality: Left;       Home Medications    Prior to Admission medications   Medication Sig Start Date End Date Taking? Authorizing Provider  promethazine  (PHENERGAN ) 12.5 MG tablet Take 1 tablet (12.5 mg total) by mouth every 8 (eight) hours as needed for nausea or vomiting. 04/22/24  Yes Leath-Warren, Etta PARAS, NP  atenolol-chlorthalidone (TENORETIC) 100-25 MG per tablet Take 1 tablet by mouth daily.    [provider]  atorvastatin (LIPITOR) 20 MG tablet Take 20 mg by mouth at bedtime.    [provider]  busPIRone (BUSPAR) 10 MG tablet Take 10 mg by mouth 3 (three) times daily. 07/07/15   [provider]  colestipol  (COLESTID ) 1 g tablet Take 1 tablet (1 g total) by mouth daily. Take 4 hours apart from other medications 09/30/23   Eartha Flavors, Toribio, MD  Dapagliflozin Pro-metFORMIN  ER (XIGDUO XR) 05-999 MG TB24 Take 1 tablet by mouth daily.    [provider]  dorzolamide-timolol (COSOPT) 2-0.5 % ophthalmic solution Place 1 drop into the left eye 2 (two) times daily.    [provider]  Ferrous Sulfate (IRON) 28 MG TABS Take 28 mg by mouth daily.    [provider]  fluticasone  (FLONASE ) 50 MCG/ACT nasal spray Place 1 spray into both nostrils daily. 07/09/23   [provider]  fluticasone  (FLONASE ) 50 MCG/ACT nasal spray Place 1 spray into both nostrils 2 (two) times daily. 01/27/24   Stuart Vernell Norris, PA-C  gabapentin  (NEURONTIN ) 300 MG capsule Take 1 capsule (300 mg total) by mouth 2 (two) times daily. 11/05/23   Claudene Penne ORN, MD  glucose blood test strip Use to check blood sugar once a day. 07/02/16   Trixie File, MD  HYDROcodone -acetaminophen  (NORCO) 10-325 MG tablet Take 1 tablet by mouth every 6 (six) hours as needed for severe pain (pain  score 7-10). 05/28/23   [provider]  hydrOXYzine (ATARAX) 50 MG tablet Take 50 mg by mouth at bedtime. 07/04/15   [provider]  ibuprofen  (ADVIL ) 800 MG tablet Take 800 mg by mouth every 8 (eight) hours as needed for moderate pain (pain score 4-6).    [provider]  levocetirizine (XYZAL) 5 MG tablet Take 5 mg by mouth every evening.    [provider]  lisinopril (ZESTRIL) 5 MG tablet Take 5 mg by mouth at bedtime.    [provider]  LORazepam (ATIVAN) 0.5 MG tablet Take 0.5 mg by mouth 2 (two) times daily as needed for anxiety.    [provider]  magnesium oxide (MAG-OX) 400 (240 Mg) MG tablet Take 400 mg by mouth at bedtime.    [provider]  Melatonin 10 MG CHEW Chew 10 mg by mouth at bedtime.    [provider]  MICROLET LANCETS MISC Use to check blood sugar. 07/02/16   Trixie File, MD  Multiple Vitamin (MULTIVITAMIN WITH  MINERALS) TABS tablet Take 1 tablet by mouth daily. Multivitamin for Adults 50+ (Iron Free)  gummy    [provider]  omeprazole (PRILOSEC) 40 MG capsule Take 40 mg by mouth 2 (two) times daily. 05/09/20   [provider]  predniSONE  (DELTASONE ) 20 MG tablet Take 2 tablets (40 mg total) by mouth daily with breakfast. 01/27/24   Stuart Vernell Norris, PA-C  promethazine -dextromethorphan (PROMETHAZINE -DM) 6.25-15 MG/5ML syrup Take 5 mLs by mouth 4 (four) times daily as needed. 01/27/24   Stuart Vernell Norris, PA-C  Semaglutide 14 MG TABS Take 14 mg by mouth daily. 04/29/20   [provider]  SYNJARDY XR 12.12-998 MG TB24 Take by mouth. 02/25/24   [provider]  tiZANidine (ZANAFLEX) 4 MG tablet Take 4 mg by mouth at bedtime. 04/29/20   [provider]  liraglutide (VICTOZA) 18 MG/3ML SOPN Inject 1.2 mg into the skin every evening.  11/02/19  [provider]    Family History Family History  Problem Relation Age of Onset   Diabetes  Mother    Hypertension Mother    Hyperlipidemia Mother    Cancer Father    Thyroid disease Sister     Social History Social History   Tobacco Use   Smoking status: Never   Smokeless tobacco: Never  Vaping Use   Vaping status: Never Used  Substance Use Topics   Alcohol use: Yes    Comment: rarely   Drug use: Not Currently    Types: Marijuana    Comment: rare marijuana use only last used 3 months ago     Allergies   Lactose intolerance (gi), Trazodone hcl, and Trulicity [dulaglutide]   Review of Systems Review of Systems Per HPI  Physical Exam Triage Vital Signs ED Triage Vitals  Encounter Vitals Group     BP 04/22/24 1527 111/75     Girls Systolic BP Percentile --      Girls Diastolic BP Percentile --      Boys Systolic BP Percentile --      Boys Diastolic BP Percentile --      Pulse Rate 04/22/24 1527 88     Resp 04/22/24 1527 16     Temp 04/22/24 1527 98.1 F (36.7 C)     Temp Source 04/22/24 1527 Oral     SpO2 04/22/24 1527 95 %     Weight --      Height --      Head Circumference --      Peak Flow --      Pain Score 04/22/24 1529 0     Pain Loc --      Pain Education --      Exclude from Growth Chart --    No data found.  Updated Vital Signs BP 111/75 (BP Location: Right Arm)   Pulse 88   Temp 98.1 F (36.7 C) (Oral)   Resp 16   SpO2 95%   Visual Acuity Right Eye Distance:   Left Eye Distance:   Bilateral Distance:    Right Eye Near:   Left Eye Near:    Bilateral Near:     Physical Exam Vitals and nursing note reviewed.  Constitutional:      General: He is not in acute distress.    Appearance: Normal appearance.  HENT:     Head: Normocephalic.     Mouth/Throat:     Mouth: Mucous membranes are moist.  Eyes:     Extraocular Movements: Extraocular movements intact.  Pupils: Pupils are equal, round, and reactive to light.  Cardiovascular:     Rate and Rhythm: Normal rate and regular rhythm.     Pulses: Normal pulses.      Heart sounds: Normal heart sounds.  Pulmonary:     Effort: Pulmonary effort is normal. No respiratory distress.     Breath sounds: Normal breath sounds. No stridor. No wheezing, rhonchi or rales.  Abdominal:     General: Bowel sounds are normal.     Palpations: Abdomen is soft.     Tenderness: There is no abdominal tenderness.  Musculoskeletal:     Cervical back: Normal range of motion.  Skin:    General: Skin is warm and dry.  Neurological:     General: No focal deficit present.     Mental Status: He is alert and oriented to person, place, and time.  Psychiatric:        Mood and Affect: Mood normal.        Behavior: Behavior normal.      UC Treatments / Results  Labs (all labs ordered are listed, but only abnormal results are displayed) Labs Reviewed - No data to display  EKG   Radiology No results found.  Procedures Procedures (including critical care time)  Medications Ordered in UC Medications - No data to display  Initial Impression / Assessment and Plan / UC Course  I have reviewed the triage vital signs and the nursing notes.  Pertinent labs & imaging results that were available during my care of the patient were reviewed by me and considered in my medical decision making (see chart for details).  Patient presents for complaints of side effects to her procedure most likely caused by steroids.  Will treat with Phenergan  12.5 mg to see if this will help with patient's nausea and vomiting as well as to help him get some rest.  Supportive care recommendations were provided and discussed with the patient to include increase fluids, and dietary changes for his appetite.  Patient was advised to follow-up with the pain management clinic if symptoms begin to worsen.  Patient was in agreement with this plan of care and verbalizes understanding.  All questions were answered.  Patient stable for discharge.  Final Clinical Impressions(s) / UC Diagnoses   Final diagnoses:   Nausea and vomiting, unspecified vomiting type  Weakness  Other fatigue  Difficulty sleeping  Medication side effects     Discharge Instructions      Take medication as prescribed. Increase fluids. You may take over-the-counter Tylenol  as needed for pain, fever, or general discomfort. Recommend a BRAT diet while nausea and vomiting persist.  This includes bananas, rice, applesauce, and toast. Follow-up with pain management as scheduled. If symptoms fail to improve, recommend scheduling an appointment with pain management to discuss the side effects of the medication provided during the procedure. Follow-up as needed.     ED Prescriptions     Medication Sig Dispense Auth. Provider   promethazine  (PHENERGAN ) 12.5 MG tablet Take 1 tablet (12.5 mg total) by mouth every 8 (eight) hours as needed for nausea or vomiting. 20 tablet Leath-Warren, Etta PARAS, NP      PDMP not reviewed this encounter.   Gilmer Etta PARAS, NP 04/22/24 1551

## 2024-04-22 NOTE — ED Triage Notes (Signed)
 See CMA's triage note.RN noted created in error.

## 2024-04-22 NOTE — ED Triage Notes (Addendum)
 Pt reports nausea vomiting, had a procedure x 1 week ago, had an C6 and C7 spinal ablation, has had side affect since the surgery. Pt states he feels like he has been constipated, hasn't had a BM in 3 days. Had one today that caused pain.

## 2024-04-23 DIAGNOSIS — G4733 Obstructive sleep apnea (adult) (pediatric): Secondary | ICD-10-CM | POA: Diagnosis not present

## 2024-04-28 DIAGNOSIS — H40021 Open angle with borderline findings, high risk, right eye: Secondary | ICD-10-CM | POA: Diagnosis not present

## 2024-04-28 DIAGNOSIS — Z961 Presence of intraocular lens: Secondary | ICD-10-CM | POA: Diagnosis not present

## 2024-04-28 DIAGNOSIS — H179 Unspecified corneal scar and opacity: Secondary | ICD-10-CM | POA: Diagnosis not present

## 2024-04-28 DIAGNOSIS — H401122 Primary open-angle glaucoma, left eye, moderate stage: Secondary | ICD-10-CM | POA: Diagnosis not present

## 2024-04-28 DIAGNOSIS — E119 Type 2 diabetes mellitus without complications: Secondary | ICD-10-CM | POA: Diagnosis not present

## 2024-04-28 DIAGNOSIS — H2511 Age-related nuclear cataract, right eye: Secondary | ICD-10-CM | POA: Diagnosis not present

## 2024-04-28 DIAGNOSIS — H0288A Meibomian gland dysfunction right eye, upper and lower eyelids: Secondary | ICD-10-CM | POA: Diagnosis not present

## 2024-05-18 DIAGNOSIS — Z818 Family history of other mental and behavioral disorders: Secondary | ICD-10-CM | POA: Diagnosis not present

## 2024-05-20 ENCOUNTER — Encounter (INDEPENDENT_AMBULATORY_CARE_PROVIDER_SITE_OTHER): Payer: Self-pay | Admitting: Gastroenterology

## 2024-06-02 ENCOUNTER — Encounter: Payer: Self-pay | Admitting: Student in an Organized Health Care Education/Training Program

## 2024-06-02 ENCOUNTER — Ambulatory Visit
Admission: RE | Admit: 2024-06-02 | Discharge: 2024-06-02 | Disposition: A | Discharge status: Home / Self Care | Source: Ambulatory Visit | Attending: Student in an Organized Health Care Education/Training Program | Admitting: Student in an Organized Health Care Education/Training Program

## 2024-06-02 ENCOUNTER — Ambulatory Visit: Admitting: Student in an Organized Health Care Education/Training Program

## 2024-06-02 VITALS — BP 141/99 | HR 72 | Temp 97.3°F | Resp 16 | Ht 74.0 in | Wt 275.0 lb

## 2024-06-02 DIAGNOSIS — G894 Chronic pain syndrome: Secondary | ICD-10-CM

## 2024-06-02 DIAGNOSIS — M4802 Spinal stenosis, cervical region: Secondary | ICD-10-CM | POA: Diagnosis not present

## 2024-06-02 DIAGNOSIS — M47812 Spondylosis without myelopathy or radiculopathy, cervical region: Secondary | ICD-10-CM

## 2024-06-02 DIAGNOSIS — M542 Cervicalgia: Secondary | ICD-10-CM | POA: Diagnosis not present

## 2024-06-02 NOTE — Progress Notes (Signed)
 PROVIDER NOTE: Interpretation of information contained herein should be left to medically-trained personnel. Specific patient instructions are provided elsewhere under Patient Instructions section of medical record. This document was created in part using AI and STT-dictation technology, any transcriptional errors that may result from this process are unintentional.  Patient: Scott [name]  Service: E/M   PCP: Arloa Elsie SAUNDERS, MD  DOB: 31-Dec-1965  DOS: 06/02/2024  Provider: Wallie Sherry, MD  MRN: [id number]  Delivery: Face-to-face  Specialty: Interventional Pain Management  Type: Established Patient  Setting: Ambulatory outpatient facility  Specialty designation: 09  Referring Prov.: Arloa Elsie SAUNDERS, MD  Location: Outpatient office facility       History of present illness (HPI) Scott [name], a 58 y.o. year old male, is here today because of his Cervical spondylosis [M47.812]. [name] primary complain today is Neck Pain (Left shoulder blade)  Pertinent problems: [name] does not have any pertinent problems on file.  Pain Assessment: Severity of Chronic pain is reported as a 4 /10. Location: Neck Mid, Upper/to left shoulder blade, hard to turn head. Onset: More than a month ago. Quality: Aching, Burning, Constant, Grimacing, Tender, Dull, Discomfort, Sharp. Timing: Constant. Modifying factor(s): procedures RF. Vitals:  height is 6' 2 (1.88 m) and weight is 275 lb (124.7 kg). His temperature is 97.3 F (36.3 C) (abnormal). His blood pressure is 141/99 (abnormal) and his pulse is 72. His respiration is 16 and oxygen saturation is 98%.  BMI: Estimated body mass index is 35.31 kg/m as calculated from the following:   Height as of this encounter: 6' 2 (1.88 m).   Weight as of this encounter: 275 lb (124.7 kg).  Last encounter: 02/27/2024. Last procedure: 04/15/2024.  Reason for encounter:  Discussed the use of AI scribe software for clinical note transcription  with the patient, who gave verbal consent to proceed.  History of Present Illness   Scott [name] is a 58 year old male with a history of cervical spine issues who presents with acute cervical spine pain following a fall.  He experiences acute pain in the cervical spine region, specifically between C6 and C7, following a fall at work. The pain radiates down to his shoulder blade. The incident occurred when a chair broke while he was sitting, causing him to fall. He attempted to protect his head during the fall, which may have exacerbated the pain. He has a history of cervical spine issues and previously underwent an ablation procedure, which had been providing relief until the recent fall. The pain has been on and off since the incident.  For pain management, he has taken ibuprofen  800 mg, which was previously prescribed for knee surgery, and reports that it provided some relief. He has also been using ice to manage the pain. He mentions experiencing mood changes, including increased anger, when he was on steroids post-procedure.  He reports muscle cramping in the affected area since the fall and is currently using a muscle relaxer. He also uses a heated couch to alleviate muscle tension. During the review of symptoms, he confirms the pain is deep and sore, and he experiences muscle cramps. No assistance was needed in getting up after the fall, indicating he was able to get up quickly due to adrenaline.  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     Of note, he previously underwent a bilateral C7, T1, T2, T3 RFA on 04/15/2024 and was doing very well up until  his fall.  ROS  Constitutional: Denies any fever or chills Gastrointestinal: No reported hemesis, hematochezia, vomiting, or acute GI distress Musculoskeletal: as above Neurological: No reported episodes of acute onset apraxia, aphasia, dysarthria, agnosia, amnesia, paralysis, loss of  coordination, or loss of consciousness  Medication Review  Dapagliflozin Pro-metFORMIN  ER, Empagliflozin-metFORMIN  HCl ER, HYDROcodone -acetaminophen , Iron, LORazepam, Melatonin, Microlet Lancets, Semaglutide, atenolol-chlorthalidone, atorvastatin, busPIRone, colestipol , dorzolamide-timolol, fluticasone , gabapentin , glucose blood, hydrOXYzine, ibuprofen , levocetirizine, liraglutide, lisinopril, magnesium oxide, multivitamin with minerals, omeprazole, predniSONE , promethazine , promethazine -dextromethorphan, and tiZANidine  History Review  Allergy: [name] is allergic to lactose intolerance (gi), trazodone hcl, and trulicity [dulaglutide]. Drug: [name]  reports that he does not currently use drugs after having used the following drugs: Marijuana. Alcohol:  reports current alcohol use. Tobacco:  reports that he has never smoked. He has never used smokeless tobacco. Social: [name]  reports that he has never smoked. He has never used smokeless tobacco. He reports current alcohol use. He reports that he does not currently use drugs after having used the following drugs: Marijuana. Medical:  has a past medical history of Anemia, Anxiety, Arthritis, Back pain, Diabetes mellitus without complication (HCC), Elevated cholesterol with elevated triglycerides, Elevated liver enzymes (2014), GERD (gastroesophageal reflux disease), Hypertension, Hypertension, and Sleep apnea. Surgical: [name]  has a past surgical history that includes Esophagogastroduodenoscopy (N/A, 09/29/2012); Colonoscopy (N/A, 09/29/2012); Fracture surgery; Hernia repair (2008); Esophagogastroduodenoscopy (egd) with propofol  (N/A, 06/22/2016); Colonoscopy with propofol  (N/A, 06/22/2016); Colonoscopy with propofol  (N/A, 05/20/2020); enteroscopy (N/A, 05/20/2020); Cataract extraction (Left); Knee arthroscopy (Left, 06/24/2020); Colonoscopy with propofol  (N/A, 03/12/2023); and biopsy (03/12/2023). Family: family history includes  Cancer in his father; Diabetes in his mother; Hyperlipidemia in his mother; Hypertension in his mother; Thyroid disease in his sister.  Laboratory Chemistry Profile   Renal Lab Results  Component Value Date   BUN 16 02/18/2023   CREATININE 0.71 02/18/2023   GFRNONAA >60 02/18/2023    Hepatic No results found for: AST, ALT, ALBUMIN, ALKPHOS, HCVAB, AMYLASE, LIPASE, AMMONIA  Electrolytes Lab Results  Component Value Date   NA 135 02/18/2023   K 3.0 (L) 02/18/2023   CL 99 02/18/2023   CALCIUM 8.9 02/18/2023    Bone No results found for: VD25OH, [id number], CI6874NY7, CI7874NY7, 25OHVITD1, 25OHVITD2, 25OHVITD3, TESTOFREE, TESTOSTERONE  Inflammation (CRP: Acute Phase) (ESR: Chronic Phase) No results found for: CRP, ESRSEDRATE, LATICACIDVEN       Note: Above Lab results reviewed.  Recent Imaging Review  DG PAIN CLINIC C-ARM 1-60 MIN NO REPORT Fluoro was used, but no Radiologist interpretation will be provided.  Please refer to NOTES tab for provider progress note. Note: Reviewed        Physical Exam  Vitals: BP (!) 141/99   Pulse 72   Temp (!) 97.3 F (36.3 C)   Resp 16   Ht 6' 2 (1.88 m)   Wt 275 lb (124.7 kg)   SpO2 98%   BMI 35.31 kg/m  BMI: Estimated body mass index is 35.31 kg/m as calculated from the following:   Height as of this encounter: 6' 2 (1.88 m).   Weight as of this encounter: 275 lb (124.7 kg). Ideal: Ideal body weight: 82.2 kg (181 lb 3.5 oz) Adjusted ideal body weight: 99.2 kg (218 lb 11.7 oz) General appearance: Well nourished, well developed, and well hydrated. In no apparent acute distress Mental status: Alert, oriented x 3 (person, place, & time)       Respiratory: No evidence of acute respiratory distress Eyes: PERLA Tenderness to  palpation at C7 with movement associated pain specifically with left lateral rotation  Assessment   Diagnosis  1. Cervical spondylosis   2. Cervical facet joint  syndrome   3. Chronic pain syndrome      Updated Problems: No problems updated.  Plan of Care  Assessment and Plan    Acute cervical spine pain and muscle spasm after fall   He experiences acute cervical spine pain and muscle spasm following a fall at work, with pain localized at C7 and radiating to the shoulder blade. The pain is severe and intermittent. Previous pain in this area was managed with ablation, and the current pain is attributed to the recent fall, with concern for a potential fracture. Ibuprofen  and ice have been used for relief, and muscle cramps began post-fall. The fall is not believed to have affected the previous ablation. Order cervical spine x-rays to rule out fracture. Provide a sample of Jornavax for acute pain management, with instructions for use and to contact if more is needed. Advise using heat in addition to ice for muscle relief. Ibuprofen  or Tylenol  can be taken with Jornavax. Discuss cost concerns of Jornavax and potential for coupons. Surgery is a last resort; recommend waiting four to six months to evaluate ablation effectiveness before considering surgical options.  I will contact the patient if there is anything concerning on his x-rays.       Orders:  Orders Placed This Encounter  Procedures   DG Cervical Spine Complete    Patient presents with axial pain with possible radicular component. Please assist us  in identifying specific level(s) and laterality of any additional findings such as: 1. Facet (Zygapophyseal) joint DJD (Hypertrophy, space narrowing, subchondral sclerosis, and/or osteophyte formation) 2. DDD and/or IVDD (Loss of disc height, desiccation, gas patterns, osteophytes, endplate sclerosis, or Black disc disease) 3. Pars defects 4. Spondylolisthesis, spondylosis, and/or spondyloarthropathies (include Degree/Grade of displacement in mm) (stability) 5. Vertebral body Fractures (acute/chronic) (state percentage of collapse) 6.  Demineralization (osteopenia/osteoporotic) 7. Bone pathology 8. Foraminal narrowing  9. Surgical changes    Standing Status:   Future    Expected Date:   06/02/2024    Expiration Date:   09/02/2024    Scheduling Instructions:     Please make sure that the patient understands that this needs to be done as soon as possible. Never have the patient do the imaging just before the next appointment. Inform patient that having the imaging done within the [facility] will expedite the availability of the results and will provide      imaging availability to the requesting physician. In addition inform the patient that the imaging order has an expiration date and will not be renewed if not done within the active period.    Reason for Exam (SYMPTOM  OR DIAGNOSIS REQUIRED):   Cervicalgia, chronic neck pain    Preferred imaging location?:   [facility]    Call Results- Best Contact Number?:   [phone] [place] [facility]     B/L C7, T1-2-3 MBNB 12/11/23, 01/22/24, B/L C7, T1, T2, T3 RFA 04/15/24     Return for PRN.    Recent Visits Date Type Provider Dept  04/15/24 Procedure visit Marcelino Nurse, MD [facility]  Showing recent visits within past 90 days and meeting all other requirements Today's Visits Date Type Provider Dept  06/02/24 Office Visit Marcelino Nurse, MD [facility]  Showing today's visits and meeting all other requirements Future Appointments  No visits were found meeting these conditions. Showing future appointments within next 90 days and meeting all other requirements  I personally spent a total of 30 minutes in the care of the patient today including preparing to see the patient, getting/reviewing separately obtained history, performing a medically appropriate exam/evaluation, counseling and educating, placing orders, and documenting clinical information in the EHR.   Note by: Wallie Sherry, MD (TTS and AI  technology used. I apologize for any typographical errors that were not detected and corrected.) Date: 06/02/2024; Time: 10:33 AM

## 2024-06-02 NOTE — Progress Notes (Signed)
 Safety precautions to be maintained throughout the outpatient stay will include: orient to surroundings, keep bed in low position, maintain call bell within reach at all times, provide assistance with transfer out of bed and ambulation.

## 2024-06-04 ENCOUNTER — Ambulatory Visit: Payer: Self-pay | Admitting: Student in an Organized Health Care Education/Training Program

## 2024-06-10 ENCOUNTER — Encounter: Payer: Self-pay | Admitting: Neurosurgery

## 2024-06-10 ENCOUNTER — Other Ambulatory Visit: Payer: Self-pay | Admitting: Nurse Practitioner

## 2024-06-10 ENCOUNTER — Ambulatory Visit (INDEPENDENT_AMBULATORY_CARE_PROVIDER_SITE_OTHER): Admitting: Neurosurgery

## 2024-06-10 ENCOUNTER — Other Ambulatory Visit: Payer: Self-pay

## 2024-06-10 ENCOUNTER — Telehealth: Payer: Self-pay | Admitting: Student in an Organized Health Care Education/Training Program

## 2024-06-10 VITALS — BP 118/82 | Wt 285.0 lb

## 2024-06-10 DIAGNOSIS — M50123 Cervical disc disorder at C6-C7 level with radiculopathy: Secondary | ICD-10-CM

## 2024-06-10 DIAGNOSIS — M47812 Spondylosis without myelopathy or radiculopathy, cervical region: Secondary | ICD-10-CM

## 2024-06-10 DIAGNOSIS — R29898 Other symptoms and signs involving the musculoskeletal system: Secondary | ICD-10-CM

## 2024-06-10 DIAGNOSIS — G4486 Cervicogenic headache: Secondary | ICD-10-CM | POA: Diagnosis not present

## 2024-06-10 DIAGNOSIS — G894 Chronic pain syndrome: Secondary | ICD-10-CM

## 2024-06-10 MED ORDER — JOURNAVX 50 MG PO TABS: 50.0000 mg | ORAL_TABLET | Freq: Two times a day (BID) | ORAL | 1 refills | Status: AC

## 2024-06-10 NOTE — Progress Notes (Signed)
 Referring Physician:  Arloa Elsie SAUNDERS, MD [phone] [place],  [place] [address]  Primary Physician:  Arloa Elsie SAUNDERS, MD  History of Present Illness: 06/10/2024 Discussed the use of AI scribe software for clinical note transcription with the patient, who gave verbal consent to proceed.  [name] is here today with a history of cervical spondylosis and bilateral upper extremity orthopedic issues.  History of Present Illness Scott [name] is a 58 year old male who presents with worsening left arm weakness and pain.  He has experienced worsening weakness in his left arm, particularly affecting the tricep and wrist extension, despite previous treatments such as physical therapy and injections.  He experiences excruciating pain in his neck and shoulders, especially in the periscapular region, which radiates to his shoulder blade. The pain is described as deep and worsens with bending down. He also reports new onset of frequent headaches, which are posterior and related to his neck issues.  He mentions intermittent numbness in his left thumb and pinky. There is also occasional numbness in his leg.    review of Systems:  A 10 point review of systems is negative, except for the pertinent positives and negatives detailed in the HPI.  Past Medical History: Past Medical History:  Diagnosis Date   Anemia    Anxiety    Arthritis    Back pain    pinched nerve sciatica   Diabetes mellitus without complication (HCC)    type  2   Elevated cholesterol with elevated triglycerides    Elevated liver enzymes 2014   normal now   GERD (gastroesophageal reflux disease)    Hypertension    Hypertension    Sleep apnea    uses cpap,setting of 14    Past Surgical History: Past Surgical History:  Procedure Laterality Date   BIOPSY  03/12/2023   Procedure: BIOPSY;  Surgeon: Eartha Angelia Sieving, MD;  [facility]: [facility];  Service:  Gastroenterology;;   CATARACT EXTRACTION Left    COLONOSCOPY N/A 09/29/2012   Procedure: COLONOSCOPY;  Surgeon: Renaye Sous, MD;  Location: [facility];  Service: Endoscopy;  Laterality: N/A;   COLONOSCOPY WITH PROPOFOL  N/A 06/22/2016   Procedure: COLONOSCOPY WITH PROPOFOL ;  Surgeon: Belvie Just, MD;  Location: [facility];  Service: Endoscopy;  Laterality: N/A;   COLONOSCOPY WITH PROPOFOL  N/A 05/20/2020   Procedure: COLONOSCOPY WITH PROPOFOL ;  Surgeon: Just Belvie, MD;  Location: [facility];  Service: Endoscopy;  Laterality: N/A;   COLONOSCOPY WITH PROPOFOL  N/A 03/12/2023   Procedure: COLONOSCOPY WITH PROPOFOL ;  Surgeon: Eartha Angelia Sieving, MD;  Location: [facility];  Service: Gastroenterology;  Laterality: N/A;  11:00am;asa 3, can be done in Room 1 if rescheduled soon 7/15 cy   ENTEROSCOPY N/A 05/20/2020   Procedure: ENTEROSCOPY;  Surgeon: Just Belvie, MD;  Location: [facility];  Service: Endoscopy;  Laterality: N/A;   ESOPHAGOGASTRODUODENOSCOPY N/A 09/29/2012   Procedure: ESOPHAGOGASTRODUODENOSCOPY (EGD);  Surgeon: Renaye Sous, MD;  Location: [facility];  Service: Endoscopy;  Laterality: N/A;   ESOPHAGOGASTRODUODENOSCOPY (EGD) WITH PROPOFOL  N/A 06/22/2016   Procedure: ESOPHAGOGASTRODUODENOSCOPY (EGD) WITH PROPOFOL ;  Surgeon: Belvie Just, MD;  Location: [facility];  Service: Endoscopy;  Laterality: N/A;   FRACTURE SURGERY     right wrist with screws   HERNIA REPAIR  2008   inguinal   KNEE ARTHROSCOPY Left 06/24/2020   Procedure: ARTHROSCOPY KNEE WITH PARTIAL MEDIAL MENISCECTOMY, CHONDROPLASTY,  OPEN CYSTECTOMY;  Surgeon: Yvone Rush, MD;  Location: [facility];  Service: Orthopedics;  Laterality: Left;    Allergies: Allergies as of 06/10/2024 - Review Complete 06/10/2024  Allergen Reaction Noted   Lactose intolerance (gi)  10/01/2022   Trazodone hcl  07/05/2021   Trulicity [dulaglutide] Other (See Comments) 07/02/2016    Medications: Outpatient Encounter  Medications as of 06/10/2024  Medication Sig   atenolol-chlorthalidone (TENORETIC) 100-25 MG per tablet Take 1 tablet by mouth daily.   atorvastatin (LIPITOR) 20 MG tablet Take 20 mg by mouth at bedtime.   busPIRone (BUSPAR) 10 MG tablet Take 10 mg by mouth 3 (three) times daily.   dorzolamide-timolol (COSOPT) 2-0.5 % ophthalmic solution Place 1 drop into the left eye 2 (two) times daily.   Ferrous Sulfate (IRON) 28 MG TABS Take 28 mg by mouth daily.   fluticasone  (FLONASE ) 50 MCG/ACT nasal spray Place 1 spray into both nostrils 2 (two) times daily.   gabapentin  (NEURONTIN ) 300 MG capsule Take 1 capsule (300 mg total) by mouth 2 (two) times daily.   glucose blood test strip Use to check blood sugar once a day.   HYDROcodone -acetaminophen  (NORCO) 10-325 MG tablet Take 1 tablet by mouth every 6 (six) hours as needed for severe pain (pain score 7-10).   hydrOXYzine (ATARAX) 50 MG tablet Take 50 mg by mouth at bedtime.   ibuprofen  (ADVIL ) 800 MG tablet Take 800 mg by mouth every 8 (eight) hours as needed for moderate pain (pain score 4-6).   levocetirizine (XYZAL) 5 MG tablet Take 5 mg by mouth every evening.   lisinopril (ZESTRIL) 5 MG tablet Take 5 mg by mouth at bedtime.   LORazepam (ATIVAN) 0.5 MG tablet Take 0.5 mg by mouth 2 (two) times daily as needed for anxiety.   magnesium oxide (MAG-OX) 400 (240 Mg) MG tablet Take 400 mg by mouth at bedtime.   Melatonin 10 MG CHEW Chew 10 mg by mouth at bedtime.   MICROLET LANCETS MISC Use to check blood sugar.   Multiple Vitamin (MULTIVITAMIN WITH MINERALS) TABS tablet Take 1 tablet by mouth daily. Multivitamin for Adults 50+ (Iron Free)  gummy   omeprazole (PRILOSEC) 40 MG capsule Take 40 mg by mouth 2 (two) times daily.   Semaglutide 14 MG TABS Take 14 mg by mouth daily.   SYNJARDY XR 12.12-998 MG TB24 Take by mouth.   tiZANidine (ZANAFLEX) 4 MG tablet Take 4 mg by mouth at bedtime.   [DISCONTINUED] colestipol  (COLESTID ) 1 g tablet Take 1 tablet (1  g total) by mouth daily. Take 4 hours apart from other medications   [DISCONTINUED] Dapagliflozin Pro-metFORMIN  ER (XIGDUO XR) 05-999 MG TB24 Take 1 tablet by mouth daily.   [DISCONTINUED] fluticasone  (FLONASE ) 50 MCG/ACT nasal spray Place 1 spray into both nostrils daily.   [DISCONTINUED] liraglutide (VICTOZA) 18 MG/3ML SOPN Inject 1.2 mg into the skin every evening.   [DISCONTINUED] predniSONE  (DELTASONE ) 20 MG tablet Take 2 tablets (40 mg total) by mouth daily with breakfast.   [DISCONTINUED] promethazine  (PHENERGAN ) 12.5 MG tablet Take 1 tablet (12.5 mg total) by mouth every 8 (eight) hours as needed for nausea or vomiting.   [DISCONTINUED] promethazine -dextromethorphan (PROMETHAZINE -DM) 6.25-15 MG/5ML syrup Take 5 mLs by mouth 4 (four) times daily as needed.   No facility-administered encounter medications on file as of 06/10/2024.    Social History: Social History   Tobacco Use   Smoking status: Never   Smokeless tobacco: Never  Vaping Use   Vaping status: Never Used  Substance Use Topics   Alcohol use: Yes    Comment: rarely  Drug use: Not Currently    Types: Marijuana    Comment: rare marijuana use only last used 3 months ago    Family Medical History: Family History  Problem Relation Age of Onset   Diabetes Mother    Hypertension Mother    Hyperlipidemia Mother    Cancer Father    Thyroid disease Sister     Physical Examination: General: Patient is well developed, well nourished, calm, collected, and in no apparent distress. Attention to examination is appropriate.  Psychiatric: Patient is non-anxious.  Head:  Pupils equal, round, and reactive to light.  ENT:  Oral mucosa appears well hydrated.  Neck:   Supple.  Decreased range of motion.  Respiratory: Patient is breathing without any difficulty.  Extremities: No edema.  Skin:   On exposed skin, there are no abnormal skin lesions.  NEUROLOGICAL:     Awake, alert, oriented to person, place, and time.   Speech is clear and fluent. Fund of knowledge is appropriate.   Cranial Nerves: Pupils equal round and reactive to light.  Facial tone is symmetric.  ROM of spine: Decreased in cervical spine.    +Spurlings.  Strength: Side Biceps Triceps Deltoid Interossei Grip Wrist Ext. Wrist Flex.  R 5 5 5 5 5 5 5   L 5 4+ 5 5 5  4+ 5   Medical Decision Making  Imaging: MRI Cervical spine: IMPRESSION: 1. C6-C7 severe left neural foraminal narrowing. 2. C3-C4 mild left neural foraminal narrowing. 3. C4-C5 and C5-C6 mild right neural foraminal narrowing. 4. C7-T1 mild left neural foraminal narrowing. 5. No spinal canal stenosis   I have personally reviewed the images and agree with the above interpretation.  Assessment and Plan: Scott [name] is a 58 year old man with a history of cervical spondylosis and myeloradiculopathy.  We have been following him for peripheral and central issues including his spine and extremity nerves.  He has had intermittent neck pain but has worked with physical occupational therapy as well as injection therapy.  He is here today to discuss continued ongoing and worsening symptoms. Assessment & Plan Cervical disc disorder with radiculopathy at C6-C7 Chronic condition with worsening symptoms, including new leg numbness and cervicogenic headaches. Concern for worsening disc herniation or spinal cord compression. - Ordered repeat MRI of the cervical spine at [facility] on [address] in [place]. - Will discuss potential surgical options post-MRI, including decompression and possible fusion or disc replacement, depending on MRI findings. - Plan for potential surgery after December 28th due to patient's current work schedule personal schedule.  Cervical spondylosis Chronic condition contributing to neck and shoulder pain with plateaued response to previous management. - Continue to monitor symptoms and manage conservatively until MRI results are available,   Cervicogenic  headache New onset likely related to cervical spondylosis and arthritis. Not a primary surgical indication but may improve with intervention for radiculopathy. - Continue to monitor headache symptoms and manage conservatively.  Thank you for involving me in the care of this patient.   Penne MICAEL Sharps, MD Dept. of Neurosurgery   Spent a total of 30 minutes reviewing his chart, going through his current issues, reviewing his imaging, helping him to coordinate his care going forward, this time includes the face-to-face appointment and evaluation.

## 2024-06-10 NOTE — Telephone Encounter (Signed)
 PT stated that her husband would like for the [facility] to be called into [facility] in [place]. PT stated that it really helps.

## 2024-06-11 ENCOUNTER — Encounter: Payer: Self-pay | Admitting: Neurosurgery

## 2024-06-18 ENCOUNTER — Encounter: Payer: Self-pay | Admitting: Nurse Practitioner

## 2024-06-18 ENCOUNTER — Ambulatory Visit (INDEPENDENT_AMBULATORY_CARE_PROVIDER_SITE_OTHER): Admitting: Nurse Practitioner

## 2024-06-18 VITALS — BP 136/72 | HR 73 | Temp 97.2°F | Ht 74.0 in | Wt 286.6 lb

## 2024-06-18 DIAGNOSIS — G4730 Sleep apnea, unspecified: Secondary | ICD-10-CM

## 2024-06-18 DIAGNOSIS — G4733 Obstructive sleep apnea (adult) (pediatric): Secondary | ICD-10-CM

## 2024-06-18 NOTE — Patient Instructions (Signed)
 Continue to use CPAP every night, minimum of 4-6 hours a night.  Change equipment as directed. Wash your tubing with warm soap and water daily, hang to dry. Wash humidifier portion weekly. Use bottled, distilled water and change daily Be aware of reduced alertness and do not drive or operate heavy machinery if experiencing this or drowsiness.  Exercise encouraged, as tolerated. Healthy weight management discussed.  Avoid or decrease alcohol consumption and medications that make you more sleepy, if possible. Notify if persistent daytime sleepiness occurs even with consistent use of PAP therapy.  Change CPAP supplies... Every month Mask cushions and/or nasal pillows CPAP machine filters Every 3 months Mask frame (not including the headgear) CPAP tubing Every 6 months Mask headgear Chin strap (if applicable) Humidifier water tub  Orders placed for new CPAP machine   Follow up in 10-12 weeks to follow up on new CPAP with any sleep provider, or sooner, if needed

## 2024-06-18 NOTE — Progress Notes (Signed)
 @Patient  ID: Scott [name], male    DOB: 07/31/1966, 58 y.o.   MRN: [id number]  Chief Complaint  Patient presents with   Obstructive Sleep Apnea    Referring provider: Arloa Elsie SAUNDERS, MD  HPI: 58 year old male, never smoker followed for sleep apnea on CPAP.  He was last seen 08/12/2023.  Past medical history significant for insomnia, IBS, hemorrhoids, palpitations.   TESTS/EVENTS: 2006 sleep study: AHI 39.2/h, SpO2 low to 69% 07/15/2023 CPAP titration >> optimal pressure 18 cmH2O with SpO2 low 90%, Large AirFit F20 Full Face   05/02/2023: OV with Hope NP for sleep consult.  History of severe sleep apnea.  Maintained on auto CPAP.  Has daytime sleepiness.  Does have issues with apnea symptoms/gasping.  Feels like pressure is not strong enough.  Download was unavailable for review.  Able to access compliance on air app that showed 100% compliance with average use 8 hours 5 minutes.  No significant air leaks.  Residual AHI 1.27/h.  Does have a history of insomnia.  Was on Lunesta; however, has been off of this recently.  Taking 25 to 50 mg hydroxyzine and 10 mg of melatonin.  Feels like this is working well for him.  Plan for CPAP titration study for further evaluation to reassess pressure settings.   08/12/2023: OV with Montford Barg NP for follow-up to review CPAP titration study.  He is optimal pressure setting was 18 cm water.  They also discovered that he was using the wrong size mask.  He was wearing a medium fullface mask and needed a large. He feels like the mask change has made a huge difference. He doesn't feel like he's having any further issues on the CPAP now. No longer having any apnea/gasping episodes at night. Feeling like his sleep is more restful. No issues with drowsy driving or morning headaches. He's still taking hydroxyzine and melatonin, which work well for him. No concerns or complaints. 07/10/2023-08/08/2023: CPAP 14-20 cmH2O 30/30 days; 100% >4 hr; average use 9 hr 11  min Pressure 95th 16.7 Leaks 95th 1.5 AHI 2  06/18/2024: Today - follow up Discussed the use of AI scribe software for clinical note transcription with the patient, who gave verbal consent to proceed.  History of Present Illness Scott [name] is a 58 year old male with severe sleep apnea who presents for a new CPAP machine.  He is due for a CPAP machine replacement in December and has been using his current machine regularly, including during naps. He experiences choking as soon as he falls asleep without his CPAP. He feels rested upon waking and has no issues with drowsy driving or residual snoring.  He opens his mouth and drools while asleep, but this does not seem to affect his sleep quality. He is interested in trying a nasal mask with his new CPAP machine, as he has seen new models that might be more comfortable than his current setup, which straps to his neck.      Allergies  Allergen Reactions   Lactose Intolerance (Gi)    Trazodone Hcl     Other reaction(s): rapid heartbeat   Trulicity [Dulaglutide] Other (See Comments)    Weakness - severe    Immunization History  Administered Date(s) Administered   PNEUMOCOCCAL CONJUGATE-20 08/14/2023    Past Medical History:  Diagnosis Date   Anemia    Anxiety    Arthritis    Back pain    pinched nerve sciatica   Diabetes mellitus without complication (HCC)  type  2   Elevated cholesterol with elevated triglycerides    Elevated liver enzymes 2014   normal now   GERD (gastroesophageal reflux disease)    Hypertension    Hypertension    Sleep apnea    uses cpap,setting of 14    Tobacco History: Social History   Tobacco Use  Smoking Status Never  Smokeless Tobacco Never   Counseling given: Not Answered   Outpatient Medications Prior to Visit  Medication Sig Dispense Refill   atenolol-chlorthalidone (TENORETIC) 100-25 MG per tablet Take 1 tablet by mouth daily.     atorvastatin (LIPITOR) 20 MG tablet Take  20 mg by mouth at bedtime.     busPIRone (BUSPAR) 10 MG tablet Take 10 mg by mouth 3 (three) times daily.  0   dorzolamide-timolol (COSOPT) 2-0.5 % ophthalmic solution Place 1 drop into the left eye 2 (two) times daily.     Ferrous Sulfate (IRON) 28 MG TABS Take 28 mg by mouth daily.     fluticasone  (FLONASE ) 50 MCG/ACT nasal spray Place 1 spray into both nostrils 2 (two) times daily. 16 g 2   gabapentin  (NEURONTIN ) 300 MG capsule Take 1 capsule (300 mg total) by mouth 2 (two) times daily. 180 capsule 1   glucose blood test strip Use to check blood sugar once a day. 100 each 12   HYDROcodone -acetaminophen  (NORCO) 10-325 MG tablet Take 1 tablet by mouth every 6 (six) hours as needed for severe pain (pain score 7-10).     hydrOXYzine (ATARAX) 50 MG tablet Take 50 mg by mouth at bedtime.  0   ibuprofen  (ADVIL ) 800 MG tablet Take 800 mg by mouth every 8 (eight) hours as needed for moderate pain (pain score 4-6).     JOURNAVX 50 MG TABS Take 50 mg by mouth every 12 (twelve) hours. 60 tablet 1   levocetirizine (XYZAL) 5 MG tablet Take 5 mg by mouth every evening.     lisinopril (ZESTRIL) 5 MG tablet Take 5 mg by mouth at bedtime.     LORazepam (ATIVAN) 0.5 MG tablet Take 0.5 mg by mouth 2 (two) times daily as needed for anxiety.     magnesium oxide (MAG-OX) 400 (240 Mg) MG tablet Take 400 mg by mouth at bedtime.     Melatonin 10 MG CHEW Chew 10 mg by mouth at bedtime.     MICROLET LANCETS MISC Use to check blood sugar. 100 each 2   Multiple Vitamin (MULTIVITAMIN WITH MINERALS) TABS tablet Take 1 tablet by mouth daily. Multivitamin for Adults 50+ (Iron Free)  gummy     omeprazole (PRILOSEC) 40 MG capsule Take 40 mg by mouth 2 (two) times daily.     Semaglutide 14 MG TABS Take 14 mg by mouth daily.     SYNJARDY XR 12.12-998 MG TB24 Take by mouth.     tiZANidine (ZANAFLEX) 4 MG tablet Take 4 mg by mouth at bedtime.     No facility-administered medications prior to visit.     Review of Systems: as  above    Physical Exam:  BP 136/72   Pulse 73   Temp (!) 97.2 F (36.2 C)   Ht 6' 2 (1.88 m)   Wt 286 lb 9.6 oz (130 kg)   SpO2 97% Comment: ra  BMI 36.80 kg/m   GEN: Pleasant, interactive, well-appearing; obese; in no acute distress HEENT:  Normocephalic and atraumatic. PERRLA. Sclera white. Nasal turbinates pink, moist and patent bilaterally. No rhinorrhea present. Oropharynx pink and  moist, without exudate or edema. No lesions, ulcerations, or postnasal drip. Mallampati III NECK:  Supple w/ fair ROM. No lymphadenopathy.   CV: RRR, no m/r/g PULMONARY:  Unlabored, regular breathing. Clear bilaterally A&P w/o wheezes/rales/rhonchi. No accessory muscle use.  GI: BS present and normoactive. Soft, non-tender to palpation.  MSK: No erythema, warmth or tenderness.  Neuro: A/Ox3. No focal deficits noted.   Skin: Warm, no lesions or rashe Psych: Normal affect and behavior. Judgement and thought content appropriate.     Lab Results:  CBC    Component Value Date/Time   WBC 5.0 02/18/2023 0827   RBC 4.99 02/18/2023 0827   HGB 14.7 02/18/2023 0827   HCT 43.3 02/18/2023 0827   PLT 207 02/18/2023 0827   MCV 86.8 02/18/2023 0827   MCH 29.5 02/18/2023 0827   MCHC 33.9 02/18/2023 0827   RDW 12.0 02/18/2023 0827   LYMPHSABS 1.5 02/18/2023 0827   MONOABS 0.5 02/18/2023 0827   EOSABS 0.1 02/18/2023 0827   BASOSABS 0.0 02/18/2023 0827    BMET    Component Value Date/Time   NA 135 02/18/2023 0827   K 3.0 (L) 02/18/2023 0827   CL 99 02/18/2023 0827   CO2 25 02/18/2023 0827   GLUCOSE 125 (H) 02/18/2023 0827   BUN 16 02/18/2023 0827   CREATININE 0.71 02/18/2023 0827   CALCIUM 8.9 02/18/2023 0827   GFRNONAA >60 02/18/2023 0827    BNP No results found for: BNP   Imaging:  DG Cervical Spine Complete Result Date: 06/03/2024 CLINICAL DATA:  Neck pain EXAM: DG CERVICAL SPINE COMPLETE 4+V COMPARISON:  08/12/2023 FINDINGS: No subluxation. Vertebral body heights are  maintained. Mild disc space narrowing C4-C5, C5-C6 and C6-C7 with degenerative osteophytes. Normal prevertebral soft tissue thickness. Dens and lateral masses are within normal limits. Suspected right foraminal narrowing at C4-C5 and C5-C6, limited assessment of left foramen due to positioning, there may be foraminal narrowing at C3-C4, C5-C6 and C6-C7 IMPRESSION: Multilevel degenerative changes of the spine as above changes of the cervical spine. Electronically Signed   By: Luke Bun M.D.   On: 06/03/2024 21:44    Administration History     None           No data to display          No results found for: NITRICOXIDE      Assessment & Plan:   Severe sleep apnea Severe OSA on CPAP. Previous symptoms of gasping at night and daytime fatigue resolved. He has excellent compliance/control. Receiving benefit from use. Aware of proper care/use and risks of untreated OSA. Safe driving practices reviewed. Orders placed for new CPAP machine. Advised to discuss trial of alternative mask options with DME.   Patient Instructions  Continue to use CPAP every night, minimum of 4-6 hours a night.  Change equipment as directed. Wash your tubing with warm soap and water daily, hang to dry. Wash humidifier portion weekly. Use bottled, distilled water and change daily Be aware of reduced alertness and do not drive or operate heavy machinery if experiencing this or drowsiness.  Exercise encouraged, as tolerated. Healthy weight management discussed.  Avoid or decrease alcohol consumption and medications that make you more sleepy, if possible. Notify if persistent daytime sleepiness occurs even with consistent use of PAP therapy.  Change CPAP supplies... Every month Mask cushions and/or nasal pillows CPAP machine filters Every 3 months Mask frame (not including the headgear) CPAP tubing Every 6 months Mask headgear Chin strap (if applicable) Humidifier water  tub  Orders placed for new  CPAP machine   Follow up in 10-12 weeks to follow up on new CPAP with any sleep provider, or sooner, if needed    Advised if symptoms do not improve or worsen, to please contact office for sooner follow up or seek emergency care.   I spent 31 minutes of dedicated to the care of this patient on the date of this encounter to include pre-visit review of records, face-to-face time with the patient discussing conditions above, post visit ordering of testing, clinical documentation with the electronic health record, making appropriate referrals as documented, and communicating necessary findings to members of the patients care team.  Comer LULLA Rouleau, NP 06/18/2024  Pt aware and understands NP's role.

## 2024-06-18 NOTE — Assessment & Plan Note (Signed)
 Severe OSA on CPAP. Previous symptoms of gasping at night and daytime fatigue resolved. He has excellent compliance/control. Receiving benefit from use. Aware of proper care/use and risks of untreated OSA. Safe driving practices reviewed. Orders placed for new CPAP machine. Advised to discuss trial of alternative mask options with DME.   Patient Instructions  Continue to use CPAP every night, minimum of 4-6 hours a night.  Change equipment as directed. Wash your tubing with warm soap and water daily, hang to dry. Wash humidifier portion weekly. Use bottled, distilled water and change daily Be aware of reduced alertness and do not drive or operate heavy machinery if experiencing this or drowsiness.  Exercise encouraged, as tolerated. Healthy weight management discussed.  Avoid or decrease alcohol consumption and medications that make you more sleepy, if possible. Notify if persistent daytime sleepiness occurs even with consistent use of PAP therapy.  Change CPAP supplies... Every month Mask cushions and/or nasal pillows CPAP machine filters Every 3 months Mask frame (not including the headgear) CPAP tubing Every 6 months Mask headgear Chin strap (if applicable) Humidifier water tub  Orders placed for new CPAP machine   Follow up in 10-12 weeks to follow up on new CPAP with any sleep provider, or sooner, if needed

## 2024-06-22 ENCOUNTER — Encounter: Payer: Self-pay | Admitting: Neurosurgery

## 2024-06-25 ENCOUNTER — Other Ambulatory Visit

## 2024-07-08 ENCOUNTER — Ambulatory Visit
Admission: RE | Admit: 2024-07-08 | Discharge: 2024-07-08 | Disposition: A | Discharge status: Home / Self Care | Source: Ambulatory Visit | Attending: Neurosurgery | Admitting: Neurosurgery

## 2024-07-08 DIAGNOSIS — R29898 Other symptoms and signs involving the musculoskeletal system: Secondary | ICD-10-CM

## 2024-07-08 DIAGNOSIS — M47812 Spondylosis without myelopathy or radiculopathy, cervical region: Secondary | ICD-10-CM

## 2024-07-08 DIAGNOSIS — M5013 Cervical disc disorder with radiculopathy, cervicothoracic region: Secondary | ICD-10-CM | POA: Diagnosis not present

## 2024-07-08 DIAGNOSIS — M50121 Cervical disc disorder at C4-C5 level with radiculopathy: Secondary | ICD-10-CM | POA: Diagnosis not present

## 2024-07-08 DIAGNOSIS — M50123 Cervical disc disorder at C6-C7 level with radiculopathy: Secondary | ICD-10-CM

## 2024-07-08 DIAGNOSIS — M4802 Spinal stenosis, cervical region: Secondary | ICD-10-CM | POA: Diagnosis not present

## 2024-07-08 MED ORDER — GADOPICLENOL 0.5 MMOL/ML IV SOLN
10.0000 mL | Freq: Once | INTRAVENOUS | Status: AC | PRN
  Administered 2024-07-08: 10 mL via INTRAVENOUS

## 2024-07-09 ENCOUNTER — Telehealth: Payer: Self-pay | Admitting: Neurosurgery

## 2024-07-09 NOTE — Telephone Encounter (Signed)
 Left message for patient to call our office back.

## 2024-07-09 NOTE — Telephone Encounter (Signed)
 Patient's wife is calling to see if his imaging results can be expedited in hopes to have surgery scheduled by the end of the year. Please advise.

## 2024-07-09 NOTE — Telephone Encounter (Signed)
 Report is finalized, I called Scott [name]-patient's wife and advised her of this and that we are sending a message to Dr Claudene to review results and get back to the patient about the next steps

## 2024-07-09 NOTE — Telephone Encounter (Signed)
 Call radiology room and asked for the report to be read sooner. Pending final report

## 2024-07-16 ENCOUNTER — Ambulatory Visit: Admitting: Neurosurgery

## 2024-07-16 DIAGNOSIS — M50123 Cervical disc disorder at C6-C7 level with radiculopathy: Secondary | ICD-10-CM

## 2024-07-17 ENCOUNTER — Telehealth: Payer: Self-pay | Admitting: Neurosurgery

## 2024-07-17 DIAGNOSIS — G4733 Obstructive sleep apnea (adult) (pediatric): Secondary | ICD-10-CM | POA: Diagnosis not present

## 2024-07-17 NOTE — Telephone Encounter (Signed)
 Pt wife states that they did not receive the telephone call from Dr. Claudene yesterday. Please advise

## 2024-07-17 NOTE — Telephone Encounter (Signed)
 Noted

## 2024-07-23 DIAGNOSIS — G4733 Obstructive sleep apnea (adult) (pediatric): Secondary | ICD-10-CM | POA: Diagnosis not present

## 2024-07-28 NOTE — Progress Notes (Signed)
 I had a follow-up phone visit today.  They were at home and I was in the office.  He gave consent to go forward with a phone visit.  We discussed imaging findings.  Thankfully he is started on a new pain regimen and feels that he is getting significant improvement.  At this point we will hold off on going forward with any surgical decompression.  Will plan to be available if they have any further questions or worsening symptoms.  Spent a total of 10 minutes on the phone call today.  Penne MICAEL Sharps, MD

## 2024-09-03 ENCOUNTER — Ambulatory Visit: Admitting: Primary Care

## 2024-09-03 DIAGNOSIS — G4730 Sleep apnea, unspecified: Secondary | ICD-10-CM

## 2024-09-10 ENCOUNTER — Other Ambulatory Visit: Payer: Self-pay | Admitting: Nurse Practitioner

## 2024-09-10 DIAGNOSIS — G894 Chronic pain syndrome: Secondary | ICD-10-CM

## 2024-09-10 DIAGNOSIS — M47812 Spondylosis without myelopathy or radiculopathy, cervical region: Secondary | ICD-10-CM

## 2024-09-14 ENCOUNTER — Ambulatory Visit: Admitting: Primary Care

## 2024-09-14 DIAGNOSIS — G4730 Sleep apnea, unspecified: Secondary | ICD-10-CM
